# Patient Record
Sex: Female | Born: 1960 | Race: White | Hispanic: No | Marital: Married | Smoking: Current every day smoker
Health system: Southern US, Community
[De-identification: ages and names within clinical notes are randomized; demographics above are authoritative.]

## PROBLEM LIST (undated history)

## (undated) DIAGNOSIS — K219 Gastro-esophageal reflux disease without esophagitis: Secondary | ICD-10-CM

## (undated) DIAGNOSIS — G8929 Other chronic pain: Secondary | ICD-10-CM

## (undated) DIAGNOSIS — F39 Unspecified mood [affective] disorder: Secondary | ICD-10-CM

## (undated) DIAGNOSIS — M797 Fibromyalgia: Secondary | ICD-10-CM

## (undated) DIAGNOSIS — M549 Dorsalgia, unspecified: Secondary | ICD-10-CM

## (undated) DIAGNOSIS — G5600 Carpal tunnel syndrome, unspecified upper limb: Secondary | ICD-10-CM

## (undated) DIAGNOSIS — I341 Nonrheumatic mitral (valve) prolapse: Secondary | ICD-10-CM

## (undated) DIAGNOSIS — F909 Attention-deficit hyperactivity disorder, unspecified type: Secondary | ICD-10-CM

## (undated) HISTORY — PX: SPINAL CORD STIMULATOR IMPLANT: SHX2422

## (undated) HISTORY — PX: CARPAL TUNNEL RELEASE: SHX101

## (undated) HISTORY — PX: BACK SURGERY: SHX140

## (undated) HISTORY — PX: ABDOMINAL HYSTERECTOMY: SHX81

## (undated) HISTORY — PX: OTHER SURGICAL HISTORY: SHX169

---

## 1997-06-29 ENCOUNTER — Inpatient Hospital Stay (HOSPITAL_COMMUNITY): Admission: EM | Admit: 1997-06-29 | Discharge: 1997-07-01 | Payer: Self-pay | Admitting: Emergency Medicine

## 1997-11-05 ENCOUNTER — Emergency Department (HOSPITAL_COMMUNITY): Admission: EM | Admit: 1997-11-05 | Discharge: 1997-11-05 | Payer: Self-pay | Admitting: Emergency Medicine

## 1998-05-15 ENCOUNTER — Inpatient Hospital Stay (HOSPITAL_COMMUNITY): Admission: EM | Admit: 1998-05-15 | Discharge: 1998-05-20 | Payer: Self-pay | Admitting: *Deleted

## 1998-06-20 ENCOUNTER — Ambulatory Visit: Admission: RE | Admit: 1998-06-20 | Discharge: 1998-06-20 | Payer: Self-pay | Admitting: Family Medicine

## 1998-06-29 ENCOUNTER — Emergency Department (HOSPITAL_COMMUNITY): Admission: EM | Admit: 1998-06-29 | Discharge: 1998-06-29 | Payer: Self-pay | Admitting: Emergency Medicine

## 1998-08-24 ENCOUNTER — Other Ambulatory Visit: Admission: RE | Admit: 1998-08-24 | Discharge: 1998-08-24 | Payer: Self-pay | Admitting: *Deleted

## 1998-08-25 ENCOUNTER — Encounter: Payer: Self-pay | Admitting: Emergency Medicine

## 1998-08-25 ENCOUNTER — Emergency Department (HOSPITAL_COMMUNITY): Admission: EM | Admit: 1998-08-25 | Discharge: 1998-08-26 | Payer: Self-pay | Admitting: Emergency Medicine

## 1998-09-30 ENCOUNTER — Emergency Department (HOSPITAL_COMMUNITY): Admission: EM | Admit: 1998-09-30 | Discharge: 1998-09-30 | Payer: Self-pay | Admitting: Emergency Medicine

## 1998-11-17 ENCOUNTER — Emergency Department (HOSPITAL_COMMUNITY): Admission: EM | Admit: 1998-11-17 | Discharge: 1998-11-17 | Payer: Self-pay | Admitting: Emergency Medicine

## 1998-11-28 ENCOUNTER — Emergency Department (HOSPITAL_COMMUNITY): Admission: EM | Admit: 1998-11-28 | Discharge: 1998-11-28 | Payer: Self-pay | Admitting: Emergency Medicine

## 1998-11-28 ENCOUNTER — Encounter: Payer: Self-pay | Admitting: Emergency Medicine

## 1999-01-17 ENCOUNTER — Encounter: Payer: Self-pay | Admitting: Emergency Medicine

## 1999-01-17 ENCOUNTER — Emergency Department (HOSPITAL_COMMUNITY): Admission: EM | Admit: 1999-01-17 | Discharge: 1999-01-17 | Payer: Self-pay | Admitting: Emergency Medicine

## 1999-03-03 ENCOUNTER — Emergency Department (HOSPITAL_COMMUNITY): Admission: EM | Admit: 1999-03-03 | Discharge: 1999-03-03 | Payer: Self-pay | Admitting: Emergency Medicine

## 1999-07-17 ENCOUNTER — Emergency Department (HOSPITAL_COMMUNITY): Admission: EM | Admit: 1999-07-17 | Discharge: 1999-07-17 | Payer: Self-pay | Admitting: Internal Medicine

## 1999-07-30 ENCOUNTER — Emergency Department (HOSPITAL_COMMUNITY): Admission: EM | Admit: 1999-07-30 | Discharge: 1999-07-30 | Payer: Self-pay | Admitting: *Deleted

## 1999-08-22 ENCOUNTER — Encounter: Payer: Self-pay | Admitting: Family Medicine

## 1999-08-22 ENCOUNTER — Ambulatory Visit (HOSPITAL_COMMUNITY): Admission: RE | Admit: 1999-08-22 | Discharge: 1999-08-22 | Payer: Self-pay | Admitting: Family Medicine

## 1999-09-05 ENCOUNTER — Encounter: Admission: RE | Admit: 1999-09-05 | Discharge: 1999-09-19 | Payer: Self-pay | Admitting: Family Medicine

## 1999-09-29 ENCOUNTER — Emergency Department (HOSPITAL_COMMUNITY): Admission: EM | Admit: 1999-09-29 | Discharge: 1999-09-29 | Payer: Self-pay | Admitting: Internal Medicine

## 1999-12-16 ENCOUNTER — Emergency Department (HOSPITAL_COMMUNITY): Admission: EM | Admit: 1999-12-16 | Discharge: 1999-12-17 | Payer: Self-pay | Admitting: Emergency Medicine

## 1999-12-17 ENCOUNTER — Encounter: Payer: Self-pay | Admitting: Emergency Medicine

## 1999-12-29 ENCOUNTER — Emergency Department (HOSPITAL_COMMUNITY): Admission: EM | Admit: 1999-12-29 | Discharge: 1999-12-29 | Payer: Self-pay | Admitting: Emergency Medicine

## 2000-01-06 ENCOUNTER — Emergency Department (HOSPITAL_COMMUNITY): Admission: EM | Admit: 2000-01-06 | Discharge: 2000-01-06 | Payer: Self-pay | Admitting: Emergency Medicine

## 2000-01-27 ENCOUNTER — Emergency Department (HOSPITAL_COMMUNITY): Admission: EM | Admit: 2000-01-27 | Discharge: 2000-01-27 | Payer: Self-pay | Admitting: *Deleted

## 2000-01-27 ENCOUNTER — Encounter: Payer: Self-pay | Admitting: *Deleted

## 2000-03-06 ENCOUNTER — Encounter: Admission: RE | Admit: 2000-03-06 | Discharge: 2000-03-06 | Payer: Self-pay | Admitting: Otolaryngology

## 2000-03-06 ENCOUNTER — Encounter: Payer: Self-pay | Admitting: Otolaryngology

## 2000-07-01 ENCOUNTER — Inpatient Hospital Stay (HOSPITAL_COMMUNITY): Admission: AD | Admit: 2000-07-01 | Discharge: 2000-07-01 | Payer: Self-pay | Admitting: Obstetrics & Gynecology

## 2000-07-02 ENCOUNTER — Other Ambulatory Visit: Admission: RE | Admit: 2000-07-02 | Discharge: 2000-07-02 | Payer: Self-pay | Admitting: Gynecology

## 2000-08-04 ENCOUNTER — Emergency Department (HOSPITAL_COMMUNITY): Admission: EM | Admit: 2000-08-04 | Discharge: 2000-08-04 | Payer: Self-pay | Admitting: Internal Medicine

## 2000-08-04 ENCOUNTER — Encounter: Payer: Self-pay | Admitting: Emergency Medicine

## 2000-08-05 ENCOUNTER — Encounter: Payer: Self-pay | Admitting: Emergency Medicine

## 2000-08-05 ENCOUNTER — Emergency Department (HOSPITAL_COMMUNITY): Admission: EM | Admit: 2000-08-05 | Discharge: 2000-08-05 | Payer: Self-pay | Admitting: *Deleted

## 2000-08-15 ENCOUNTER — Encounter: Payer: Self-pay | Admitting: Emergency Medicine

## 2000-08-15 ENCOUNTER — Inpatient Hospital Stay (HOSPITAL_COMMUNITY): Admission: EM | Admit: 2000-08-15 | Discharge: 2000-08-17 | Payer: Self-pay | Admitting: *Deleted

## 2000-09-15 ENCOUNTER — Inpatient Hospital Stay (HOSPITAL_COMMUNITY): Admission: AD | Admit: 2000-09-15 | Discharge: 2000-09-15 | Payer: Self-pay | Admitting: Gynecology

## 2000-09-24 ENCOUNTER — Emergency Department (HOSPITAL_COMMUNITY): Admission: EM | Admit: 2000-09-24 | Discharge: 2000-09-24 | Payer: Self-pay | Admitting: Emergency Medicine

## 2000-09-30 ENCOUNTER — Emergency Department (HOSPITAL_COMMUNITY): Admission: EM | Admit: 2000-09-30 | Discharge: 2000-09-30 | Payer: Self-pay | Admitting: Emergency Medicine

## 2000-09-30 ENCOUNTER — Encounter: Payer: Self-pay | Admitting: Emergency Medicine

## 2000-10-17 ENCOUNTER — Emergency Department (HOSPITAL_COMMUNITY): Admission: EM | Admit: 2000-10-17 | Discharge: 2000-10-17 | Payer: Self-pay

## 2000-12-22 ENCOUNTER — Emergency Department (HOSPITAL_COMMUNITY): Admission: EM | Admit: 2000-12-22 | Discharge: 2000-12-22 | Payer: Self-pay | Admitting: Emergency Medicine

## 2001-01-07 ENCOUNTER — Emergency Department (HOSPITAL_COMMUNITY): Admission: EM | Admit: 2001-01-07 | Discharge: 2001-01-07 | Payer: Self-pay | Admitting: Emergency Medicine

## 2001-06-07 ENCOUNTER — Encounter: Admission: RE | Admit: 2001-06-07 | Discharge: 2001-06-07 | Payer: Self-pay | Admitting: Family Medicine

## 2001-06-07 ENCOUNTER — Encounter: Payer: Self-pay | Admitting: Family Medicine

## 2001-06-24 ENCOUNTER — Encounter: Admission: RE | Admit: 2001-06-24 | Discharge: 2001-09-22 | Payer: Self-pay | Admitting: Orthopedic Surgery

## 2001-06-27 ENCOUNTER — Emergency Department (HOSPITAL_COMMUNITY): Admission: EM | Admit: 2001-06-27 | Discharge: 2001-06-27 | Payer: Self-pay | Admitting: Emergency Medicine

## 2001-08-04 ENCOUNTER — Encounter: Admission: RE | Admit: 2001-08-04 | Discharge: 2001-08-18 | Payer: Self-pay | Admitting: Orthopedic Surgery

## 2001-08-18 ENCOUNTER — Encounter: Admission: RE | Admit: 2001-08-18 | Discharge: 2001-10-19 | Payer: Self-pay

## 2001-08-22 ENCOUNTER — Inpatient Hospital Stay (HOSPITAL_COMMUNITY): Admission: EM | Admit: 2001-08-22 | Discharge: 2001-08-23 | Payer: Self-pay | Admitting: Emergency Medicine

## 2001-08-22 ENCOUNTER — Encounter: Payer: Self-pay | Admitting: Emergency Medicine

## 2001-10-04 ENCOUNTER — Ambulatory Visit (HOSPITAL_COMMUNITY): Admission: RE | Admit: 2001-10-04 | Discharge: 2001-10-04 | Payer: Self-pay

## 2001-11-04 ENCOUNTER — Encounter: Admission: RE | Admit: 2001-11-04 | Discharge: 2002-02-02 | Payer: Self-pay

## 2002-01-05 ENCOUNTER — Other Ambulatory Visit: Admission: RE | Admit: 2002-01-05 | Discharge: 2002-01-05 | Payer: Self-pay | Admitting: Gynecology

## 2002-03-24 ENCOUNTER — Observation Stay (HOSPITAL_COMMUNITY): Admission: RE | Admit: 2002-03-24 | Discharge: 2002-03-25 | Payer: Self-pay

## 2002-03-24 ENCOUNTER — Encounter (INDEPENDENT_AMBULATORY_CARE_PROVIDER_SITE_OTHER): Payer: Self-pay

## 2002-03-26 ENCOUNTER — Inpatient Hospital Stay (HOSPITAL_COMMUNITY): Admission: AD | Admit: 2002-03-26 | Discharge: 2002-03-26 | Payer: Self-pay | Admitting: Gynecology

## 2002-03-31 ENCOUNTER — Encounter: Admission: RE | Admit: 2002-03-31 | Discharge: 2002-06-29 | Payer: Self-pay

## 2002-05-11 ENCOUNTER — Encounter
Admission: RE | Admit: 2002-05-11 | Discharge: 2002-07-19 | Payer: Self-pay | Admitting: Physical Medicine and Rehabilitation

## 2002-06-16 ENCOUNTER — Emergency Department (HOSPITAL_COMMUNITY): Admission: EM | Admit: 2002-06-16 | Discharge: 2002-06-16 | Payer: Self-pay | Admitting: Emergency Medicine

## 2002-06-29 ENCOUNTER — Emergency Department (HOSPITAL_COMMUNITY): Admission: EM | Admit: 2002-06-29 | Discharge: 2002-06-29 | Payer: Self-pay | Admitting: Emergency Medicine

## 2002-11-18 ENCOUNTER — Emergency Department (HOSPITAL_COMMUNITY): Admission: EM | Admit: 2002-11-18 | Discharge: 2002-11-18 | Payer: Self-pay | Admitting: Emergency Medicine

## 2002-12-18 ENCOUNTER — Observation Stay (HOSPITAL_COMMUNITY): Admission: EM | Admit: 2002-12-18 | Discharge: 2002-12-18 | Payer: Self-pay | Admitting: *Deleted

## 2002-12-27 ENCOUNTER — Emergency Department (HOSPITAL_COMMUNITY): Admission: EM | Admit: 2002-12-27 | Discharge: 2002-12-27 | Payer: Self-pay | Admitting: Emergency Medicine

## 2003-04-30 ENCOUNTER — Emergency Department (HOSPITAL_COMMUNITY): Admission: EM | Admit: 2003-04-30 | Discharge: 2003-04-30 | Payer: Self-pay | Admitting: *Deleted

## 2003-07-25 ENCOUNTER — Encounter: Admission: RE | Admit: 2003-07-25 | Discharge: 2003-07-25 | Payer: Self-pay | Admitting: Family Medicine

## 2004-01-12 ENCOUNTER — Encounter: Admission: RE | Admit: 2004-01-12 | Discharge: 2004-01-12 | Payer: Self-pay | Admitting: Internal Medicine

## 2004-03-19 ENCOUNTER — Emergency Department (HOSPITAL_COMMUNITY): Admission: EM | Admit: 2004-03-19 | Discharge: 2004-03-19 | Payer: Self-pay | Admitting: Emergency Medicine

## 2004-04-14 ENCOUNTER — Emergency Department (HOSPITAL_COMMUNITY): Admission: EM | Admit: 2004-04-14 | Discharge: 2004-04-14 | Payer: Self-pay | Admitting: *Deleted

## 2004-05-10 ENCOUNTER — Emergency Department (HOSPITAL_COMMUNITY): Admission: EM | Admit: 2004-05-10 | Discharge: 2004-05-10 | Payer: Self-pay | Admitting: Emergency Medicine

## 2004-05-11 ENCOUNTER — Inpatient Hospital Stay (HOSPITAL_COMMUNITY): Admission: AD | Admit: 2004-05-11 | Discharge: 2004-05-11 | Payer: Self-pay | Admitting: *Deleted

## 2004-06-21 ENCOUNTER — Emergency Department (HOSPITAL_COMMUNITY): Admission: EM | Admit: 2004-06-21 | Discharge: 2004-06-21 | Payer: Self-pay | Admitting: Emergency Medicine

## 2004-07-17 ENCOUNTER — Emergency Department (HOSPITAL_COMMUNITY): Admission: EM | Admit: 2004-07-17 | Discharge: 2004-07-18 | Payer: Self-pay | Admitting: Emergency Medicine

## 2004-07-20 ENCOUNTER — Emergency Department (HOSPITAL_COMMUNITY): Admission: EM | Admit: 2004-07-20 | Discharge: 2004-07-20 | Payer: Self-pay | Admitting: Emergency Medicine

## 2004-07-25 ENCOUNTER — Emergency Department (HOSPITAL_COMMUNITY): Admission: EM | Admit: 2004-07-25 | Discharge: 2004-07-25 | Payer: Self-pay | Admitting: Emergency Medicine

## 2004-07-30 ENCOUNTER — Encounter: Admission: RE | Admit: 2004-07-30 | Discharge: 2004-07-30 | Payer: Self-pay | Admitting: Orthopaedic Surgery

## 2004-08-04 ENCOUNTER — Emergency Department (HOSPITAL_COMMUNITY): Admission: EM | Admit: 2004-08-04 | Discharge: 2004-08-04 | Payer: Self-pay | Admitting: *Deleted

## 2004-08-27 ENCOUNTER — Encounter: Admission: RE | Admit: 2004-08-27 | Discharge: 2004-08-27 | Payer: Self-pay | Admitting: Internal Medicine

## 2004-09-06 ENCOUNTER — Encounter: Admission: RE | Admit: 2004-09-06 | Discharge: 2004-09-06 | Payer: Self-pay | Admitting: Internal Medicine

## 2004-10-12 ENCOUNTER — Emergency Department (HOSPITAL_COMMUNITY): Admission: EM | Admit: 2004-10-12 | Discharge: 2004-10-12 | Payer: Self-pay | Admitting: Emergency Medicine

## 2004-10-29 ENCOUNTER — Emergency Department (HOSPITAL_COMMUNITY): Admission: EM | Admit: 2004-10-29 | Discharge: 2004-10-29 | Payer: Self-pay | Admitting: Emergency Medicine

## 2004-10-30 ENCOUNTER — Emergency Department (HOSPITAL_COMMUNITY): Admission: EM | Admit: 2004-10-30 | Discharge: 2004-10-30 | Payer: Self-pay | Admitting: Emergency Medicine

## 2004-11-06 ENCOUNTER — Emergency Department (HOSPITAL_COMMUNITY): Admission: EM | Admit: 2004-11-06 | Discharge: 2004-11-06 | Payer: Self-pay | Admitting: Emergency Medicine

## 2004-11-08 ENCOUNTER — Ambulatory Visit (HOSPITAL_COMMUNITY): Admission: RE | Admit: 2004-11-08 | Discharge: 2004-11-08 | Payer: Self-pay | Admitting: Internal Medicine

## 2004-12-16 ENCOUNTER — Emergency Department (HOSPITAL_COMMUNITY): Admission: EM | Admit: 2004-12-16 | Discharge: 2004-12-16 | Payer: Self-pay | Admitting: Emergency Medicine

## 2004-12-26 ENCOUNTER — Other Ambulatory Visit: Admission: RE | Admit: 2004-12-26 | Discharge: 2004-12-26 | Payer: Self-pay | Admitting: Gynecology

## 2005-01-22 ENCOUNTER — Emergency Department (HOSPITAL_COMMUNITY): Admission: EM | Admit: 2005-01-22 | Discharge: 2005-01-22 | Payer: Self-pay | Admitting: Emergency Medicine

## 2005-02-05 ENCOUNTER — Emergency Department (HOSPITAL_COMMUNITY): Admission: EM | Admit: 2005-02-05 | Discharge: 2005-02-05 | Payer: Self-pay | Admitting: Emergency Medicine

## 2005-04-16 ENCOUNTER — Inpatient Hospital Stay (HOSPITAL_COMMUNITY): Admission: RE | Admit: 2005-04-16 | Discharge: 2005-04-18 | Payer: Self-pay | Admitting: Psychiatry

## 2005-04-17 ENCOUNTER — Ambulatory Visit: Payer: Self-pay | Admitting: Psychiatry

## 2005-05-03 ENCOUNTER — Emergency Department (HOSPITAL_COMMUNITY): Admission: EM | Admit: 2005-05-03 | Discharge: 2005-05-03 | Payer: Self-pay | Admitting: Emergency Medicine

## 2005-06-11 ENCOUNTER — Other Ambulatory Visit: Admission: RE | Admit: 2005-06-11 | Discharge: 2005-06-11 | Payer: Self-pay | Admitting: Gynecology

## 2005-06-25 ENCOUNTER — Emergency Department (HOSPITAL_COMMUNITY): Admission: EM | Admit: 2005-06-25 | Discharge: 2005-06-25 | Payer: Self-pay | Admitting: Emergency Medicine

## 2005-07-17 ENCOUNTER — Emergency Department (HOSPITAL_COMMUNITY): Admission: EM | Admit: 2005-07-17 | Discharge: 2005-07-17 | Payer: Self-pay | Admitting: Emergency Medicine

## 2005-08-05 ENCOUNTER — Encounter: Payer: Self-pay | Admitting: Emergency Medicine

## 2005-08-05 ENCOUNTER — Emergency Department (HOSPITAL_COMMUNITY): Admission: EM | Admit: 2005-08-05 | Discharge: 2005-08-05 | Payer: Self-pay | Admitting: Emergency Medicine

## 2005-08-06 ENCOUNTER — Emergency Department (HOSPITAL_COMMUNITY): Admission: EM | Admit: 2005-08-06 | Discharge: 2005-08-06 | Payer: Self-pay | Admitting: Emergency Medicine

## 2005-08-17 ENCOUNTER — Emergency Department (HOSPITAL_COMMUNITY): Admission: EM | Admit: 2005-08-17 | Discharge: 2005-08-17 | Payer: Self-pay | Admitting: Emergency Medicine

## 2005-08-18 ENCOUNTER — Emergency Department (HOSPITAL_COMMUNITY): Admission: EM | Admit: 2005-08-18 | Discharge: 2005-08-18 | Payer: Self-pay | Admitting: Emergency Medicine

## 2005-09-16 ENCOUNTER — Encounter
Admission: RE | Admit: 2005-09-16 | Discharge: 2005-12-15 | Payer: Self-pay | Admitting: Physical Medicine and Rehabilitation

## 2005-09-16 ENCOUNTER — Ambulatory Visit: Payer: Self-pay | Admitting: Physical Medicine and Rehabilitation

## 2005-09-23 ENCOUNTER — Ambulatory Visit: Payer: Self-pay | Admitting: Anesthesiology

## 2005-09-30 ENCOUNTER — Encounter: Admission: RE | Admit: 2005-09-30 | Discharge: 2005-09-30 | Payer: Self-pay | Admitting: Gynecology

## 2005-10-14 ENCOUNTER — Other Ambulatory Visit: Admission: RE | Admit: 2005-10-14 | Discharge: 2005-10-14 | Payer: Self-pay | Admitting: Gynecology

## 2005-11-11 ENCOUNTER — Emergency Department (HOSPITAL_COMMUNITY): Admission: EM | Admit: 2005-11-11 | Discharge: 2005-11-12 | Payer: Self-pay | Admitting: Emergency Medicine

## 2005-11-12 ENCOUNTER — Ambulatory Visit (HOSPITAL_COMMUNITY): Admission: RE | Admit: 2005-11-12 | Discharge: 2005-11-12 | Payer: Self-pay | Admitting: Family Medicine

## 2005-11-12 ENCOUNTER — Encounter: Payer: Self-pay | Admitting: Vascular Surgery

## 2005-11-23 ENCOUNTER — Emergency Department (HOSPITAL_COMMUNITY): Admission: EM | Admit: 2005-11-23 | Discharge: 2005-11-24 | Payer: Self-pay | Admitting: Emergency Medicine

## 2006-01-30 ENCOUNTER — Encounter
Admission: RE | Admit: 2006-01-30 | Discharge: 2006-04-30 | Payer: Self-pay | Admitting: Physical Medicine and Rehabilitation

## 2006-01-30 ENCOUNTER — Ambulatory Visit: Payer: Self-pay | Admitting: Physical Medicine and Rehabilitation

## 2006-03-02 ENCOUNTER — Ambulatory Visit: Payer: Self-pay | Admitting: Physical Medicine and Rehabilitation

## 2006-03-03 ENCOUNTER — Emergency Department (HOSPITAL_COMMUNITY): Admission: EM | Admit: 2006-03-03 | Discharge: 2006-03-03 | Payer: Self-pay | Admitting: Emergency Medicine

## 2006-03-31 ENCOUNTER — Ambulatory Visit: Payer: Self-pay | Admitting: Physical Medicine and Rehabilitation

## 2006-04-18 ENCOUNTER — Emergency Department (HOSPITAL_COMMUNITY): Admission: EM | Admit: 2006-04-18 | Discharge: 2006-04-19 | Payer: Self-pay | Admitting: Emergency Medicine

## 2006-04-19 ENCOUNTER — Inpatient Hospital Stay (HOSPITAL_COMMUNITY): Admission: EM | Admit: 2006-04-19 | Discharge: 2006-04-20 | Payer: Self-pay | Admitting: Emergency Medicine

## 2006-05-12 ENCOUNTER — Ambulatory Visit: Payer: Self-pay | Admitting: Physical Medicine and Rehabilitation

## 2006-05-12 ENCOUNTER — Encounter
Admission: RE | Admit: 2006-05-12 | Discharge: 2006-08-10 | Payer: Self-pay | Admitting: Physical Medicine and Rehabilitation

## 2006-05-28 ENCOUNTER — Emergency Department (HOSPITAL_COMMUNITY): Admission: EM | Admit: 2006-05-28 | Discharge: 2006-05-28 | Payer: Self-pay | Admitting: Emergency Medicine

## 2006-06-12 ENCOUNTER — Ambulatory Visit: Payer: Self-pay | Admitting: Psychiatry

## 2006-06-12 ENCOUNTER — Inpatient Hospital Stay (HOSPITAL_COMMUNITY): Admission: AD | Admit: 2006-06-12 | Discharge: 2006-06-17 | Payer: Self-pay | Admitting: Psychiatry

## 2006-06-15 ENCOUNTER — Encounter: Payer: Self-pay | Admitting: Emergency Medicine

## 2006-06-19 ENCOUNTER — Ambulatory Visit: Payer: Self-pay | Admitting: Physical Medicine and Rehabilitation

## 2006-06-22 ENCOUNTER — Emergency Department (HOSPITAL_COMMUNITY): Admission: EM | Admit: 2006-06-22 | Discharge: 2006-06-22 | Payer: Self-pay | Admitting: Emergency Medicine

## 2006-06-22 ENCOUNTER — Ambulatory Visit (HOSPITAL_COMMUNITY)
Admission: RE | Admit: 2006-06-22 | Discharge: 2006-06-22 | Payer: Self-pay | Admitting: Physical Medicine and Rehabilitation

## 2006-06-30 ENCOUNTER — Emergency Department (HOSPITAL_COMMUNITY): Admission: EM | Admit: 2006-06-30 | Discharge: 2006-06-30 | Payer: Self-pay | Admitting: Emergency Medicine

## 2006-07-03 ENCOUNTER — Emergency Department (HOSPITAL_COMMUNITY): Admission: EM | Admit: 2006-07-03 | Discharge: 2006-07-04 | Payer: Self-pay | Admitting: Emergency Medicine

## 2006-07-14 ENCOUNTER — Emergency Department (HOSPITAL_COMMUNITY): Admission: EM | Admit: 2006-07-14 | Discharge: 2006-07-14 | Payer: Self-pay | Admitting: Emergency Medicine

## 2006-07-16 ENCOUNTER — Emergency Department (HOSPITAL_COMMUNITY): Admission: EM | Admit: 2006-07-16 | Discharge: 2006-07-16 | Payer: Self-pay | Admitting: Emergency Medicine

## 2006-07-17 ENCOUNTER — Emergency Department (HOSPITAL_COMMUNITY): Admission: EM | Admit: 2006-07-17 | Discharge: 2006-07-17 | Payer: Self-pay | Admitting: Emergency Medicine

## 2006-08-06 ENCOUNTER — Emergency Department (HOSPITAL_COMMUNITY): Admission: EM | Admit: 2006-08-06 | Discharge: 2006-08-06 | Payer: Self-pay | Admitting: Emergency Medicine

## 2006-08-16 ENCOUNTER — Emergency Department (HOSPITAL_COMMUNITY): Admission: EM | Admit: 2006-08-16 | Discharge: 2006-08-16 | Payer: Self-pay | Admitting: Emergency Medicine

## 2006-09-11 ENCOUNTER — Emergency Department (HOSPITAL_COMMUNITY): Admission: EM | Admit: 2006-09-11 | Discharge: 2006-09-11 | Payer: Self-pay | Admitting: Emergency Medicine

## 2006-10-02 ENCOUNTER — Other Ambulatory Visit: Admission: RE | Admit: 2006-10-02 | Discharge: 2006-10-02 | Payer: Self-pay | Admitting: Gynecology

## 2006-11-12 ENCOUNTER — Encounter: Admission: RE | Admit: 2006-11-12 | Discharge: 2006-11-12 | Payer: Self-pay | Admitting: Orthopedic Surgery

## 2006-11-23 ENCOUNTER — Encounter: Admission: RE | Admit: 2006-11-23 | Discharge: 2006-11-23 | Payer: Self-pay | Admitting: Orthopedic Surgery

## 2006-11-23 ENCOUNTER — Encounter: Admission: RE | Admit: 2006-11-23 | Discharge: 2006-11-23 | Payer: Self-pay | Admitting: Neurology

## 2007-01-07 ENCOUNTER — Encounter: Admission: RE | Admit: 2007-01-07 | Discharge: 2007-01-07 | Payer: Self-pay | Admitting: Family Medicine

## 2007-01-21 ENCOUNTER — Other Ambulatory Visit: Payer: Self-pay | Admitting: Emergency Medicine

## 2007-01-21 ENCOUNTER — Ambulatory Visit: Payer: Self-pay | Admitting: Psychiatry

## 2007-01-21 ENCOUNTER — Inpatient Hospital Stay (HOSPITAL_COMMUNITY): Admission: AD | Admit: 2007-01-21 | Discharge: 2007-01-24 | Payer: Self-pay | Admitting: Psychiatry

## 2007-01-31 ENCOUNTER — Emergency Department (HOSPITAL_COMMUNITY): Admission: EM | Admit: 2007-01-31 | Discharge: 2007-01-31 | Payer: Self-pay | Admitting: Emergency Medicine

## 2007-01-31 ENCOUNTER — Inpatient Hospital Stay (HOSPITAL_COMMUNITY): Admission: AD | Admit: 2007-01-31 | Discharge: 2007-01-31 | Payer: Self-pay | Admitting: Gynecology

## 2007-02-04 ENCOUNTER — Emergency Department (HOSPITAL_COMMUNITY): Admission: EM | Admit: 2007-02-04 | Discharge: 2007-02-04 | Payer: Self-pay | Admitting: Emergency Medicine

## 2007-02-05 ENCOUNTER — Ambulatory Visit: Payer: Self-pay | Admitting: Gastroenterology

## 2007-02-05 ENCOUNTER — Ambulatory Visit: Payer: Self-pay | Admitting: Cardiology

## 2007-02-05 LAB — CONVERTED CEMR LAB
Albumin: 3.9 g/dL (ref 3.5–5.2)
Alkaline Phosphatase: 36 units/L — ABNORMAL LOW (ref 39–117)
BUN: 7 mg/dL (ref 6–23)
Basophils Absolute: 0 10*3/uL (ref 0.0–0.1)
Basophils Relative: 0.4 % (ref 0.0–1.0)
Folate: 4.1 ng/mL
GFR calc Af Amer: 87 mL/min
GFR calc non Af Amer: 72 mL/min
HCT: 43.3 % (ref 36.0–46.0)
Hemoglobin, Urine: NEGATIVE
Lipase: 24 units/L (ref 11.0–59.0)
MCHC: 35.7 g/dL (ref 30.0–36.0)
Neutrophils Relative %: 54.6 % (ref 43.0–77.0)
Potassium: 5 meq/L (ref 3.5–5.1)
RBC: 4.68 M/uL (ref 3.87–5.11)
RDW: 11.9 % (ref 11.5–14.6)
Saturation Ratios: 20.9 % (ref 20.0–50.0)
Sed Rate: 4 mm/hr (ref 0–25)
Specific Gravity, Urine: 1.02 (ref 1.000–1.03)
Total Protein, Urine: NEGATIVE mg/dL
Transferrin: 327.6 mg/dL (ref >=212.0)
WBC: 8.1 10*3/uL (ref 4.5–10.5)
pH: 7 (ref 5.0–8.0)

## 2007-02-26 ENCOUNTER — Ambulatory Visit: Payer: Self-pay | Admitting: Gastroenterology

## 2007-03-24 ENCOUNTER — Ambulatory Visit: Payer: Self-pay | Admitting: Gastroenterology

## 2007-03-24 ENCOUNTER — Encounter: Payer: Self-pay | Admitting: Gastroenterology

## 2007-03-25 ENCOUNTER — Emergency Department (HOSPITAL_COMMUNITY): Admission: EM | Admit: 2007-03-25 | Discharge: 2007-03-25 | Payer: Self-pay | Admitting: Emergency Medicine

## 2007-03-27 ENCOUNTER — Emergency Department (HOSPITAL_COMMUNITY): Admission: EM | Admit: 2007-03-27 | Discharge: 2007-03-28 | Payer: Self-pay | Admitting: Emergency Medicine

## 2007-04-27 ENCOUNTER — Ambulatory Visit: Payer: Self-pay | Admitting: Gastroenterology

## 2007-05-17 ENCOUNTER — Telehealth: Payer: Self-pay | Admitting: Gastroenterology

## 2007-05-19 ENCOUNTER — Telehealth: Payer: Self-pay | Admitting: Gastroenterology

## 2007-06-16 ENCOUNTER — Telehealth: Payer: Self-pay | Admitting: Gastroenterology

## 2007-06-24 ENCOUNTER — Emergency Department (HOSPITAL_COMMUNITY): Admission: EM | Admit: 2007-06-24 | Discharge: 2007-06-25 | Payer: Self-pay | Admitting: Emergency Medicine

## 2007-06-30 ENCOUNTER — Telehealth: Payer: Self-pay | Admitting: Gastroenterology

## 2007-07-07 ENCOUNTER — Observation Stay (HOSPITAL_COMMUNITY): Admission: EM | Admit: 2007-07-07 | Discharge: 2007-07-08 | Payer: Self-pay | Admitting: Emergency Medicine

## 2007-07-08 ENCOUNTER — Inpatient Hospital Stay (HOSPITAL_COMMUNITY): Admission: RE | Admit: 2007-07-08 | Discharge: 2007-07-12 | Payer: Self-pay | Admitting: Psychiatry

## 2007-07-09 ENCOUNTER — Ambulatory Visit: Payer: Self-pay | Admitting: Psychiatry

## 2007-08-17 ENCOUNTER — Emergency Department (HOSPITAL_COMMUNITY): Admission: EM | Admit: 2007-08-17 | Discharge: 2007-08-17 | Payer: Self-pay | Admitting: Emergency Medicine

## 2007-09-24 ENCOUNTER — Emergency Department (HOSPITAL_COMMUNITY): Admission: EM | Admit: 2007-09-24 | Discharge: 2007-09-24 | Payer: Self-pay | Admitting: Emergency Medicine

## 2007-10-11 ENCOUNTER — Emergency Department (HOSPITAL_COMMUNITY): Admission: EM | Admit: 2007-10-11 | Discharge: 2007-10-11 | Payer: Self-pay | Admitting: Emergency Medicine

## 2007-12-08 ENCOUNTER — Ambulatory Visit: Payer: Self-pay | Admitting: Gynecology

## 2007-12-09 ENCOUNTER — Ambulatory Visit: Payer: Self-pay | Admitting: Gynecology

## 2008-01-05 ENCOUNTER — Ambulatory Visit: Payer: Self-pay | Admitting: Gynecology

## 2008-03-01 ENCOUNTER — Ambulatory Visit: Payer: Self-pay | Admitting: Gynecology

## 2008-03-07 ENCOUNTER — Emergency Department (HOSPITAL_COMMUNITY): Admission: EM | Admit: 2008-03-07 | Discharge: 2008-03-07 | Payer: Self-pay | Admitting: *Deleted

## 2008-03-31 ENCOUNTER — Encounter: Admission: RE | Admit: 2008-03-31 | Discharge: 2008-03-31 | Payer: Self-pay | Admitting: Family Medicine

## 2008-07-24 ENCOUNTER — Emergency Department (HOSPITAL_COMMUNITY): Admission: EM | Admit: 2008-07-24 | Discharge: 2008-07-24 | Payer: Self-pay | Admitting: Emergency Medicine

## 2008-11-21 ENCOUNTER — Emergency Department (HOSPITAL_COMMUNITY): Admission: EM | Admit: 2008-11-21 | Discharge: 2008-11-21 | Payer: Self-pay | Admitting: Emergency Medicine

## 2008-12-08 ENCOUNTER — Emergency Department (HOSPITAL_COMMUNITY): Admission: EM | Admit: 2008-12-08 | Discharge: 2008-12-08 | Payer: Self-pay | Admitting: Emergency Medicine

## 2009-01-06 ENCOUNTER — Other Ambulatory Visit: Payer: Self-pay | Admitting: Emergency Medicine

## 2009-01-07 ENCOUNTER — Other Ambulatory Visit: Payer: Self-pay | Admitting: Emergency Medicine

## 2009-01-07 ENCOUNTER — Inpatient Hospital Stay (HOSPITAL_COMMUNITY): Admission: AD | Admit: 2009-01-07 | Discharge: 2009-01-11 | Payer: Self-pay | Admitting: Psychiatry

## 2009-01-07 ENCOUNTER — Ambulatory Visit: Payer: Self-pay | Admitting: Psychiatry

## 2009-01-09 IMAGING — CT CT HEAD W/O CM
1 series · 16 of 30 positions shown, 20 images · IV contrast (agent unspecified)
Comparison: none

HISTORY: Altered mental status, syncope

CT HEAD WITHOUT CONTRAST:
Routine noncontrast CT head compared to 08/05/2005
Normal ventricular morphology.
No midline shift or mass-effect.
Normal appearance of brain parenchyma.
No intracranial mass, hemorrhage, or infarction.
Visualized sinuses clear.
Bones unremarkable.

[Series 2: head_seq 4.5 h45s st · axial · 0.43mm/px · z∈[-145,-1]mm · 16 of 36 slices shown, 20 images]
[im 2/36  brain]
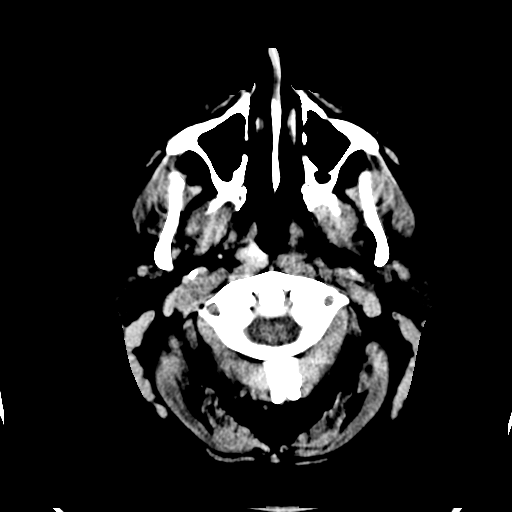
[im 2/36  bone]
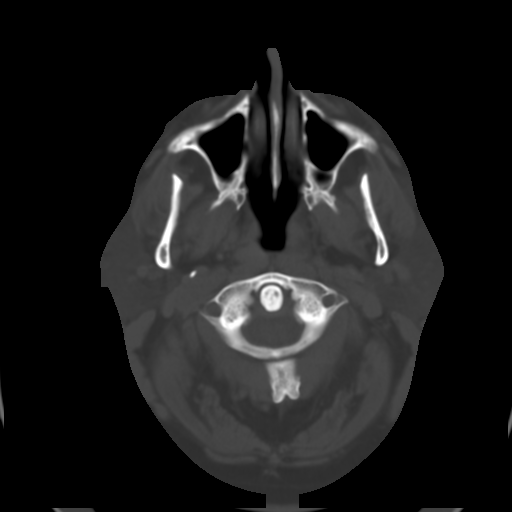
[im 4/36  brain]
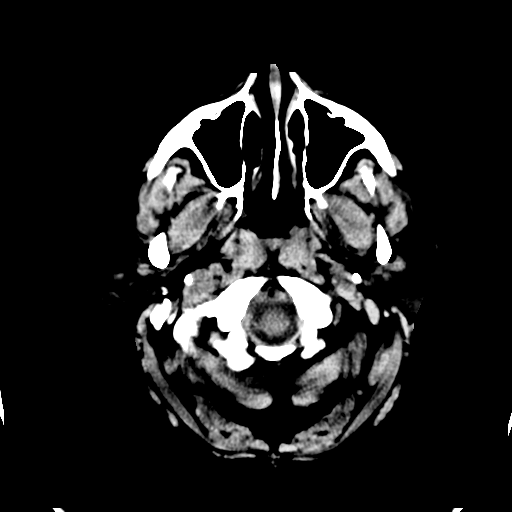
[im 7/36  brain]
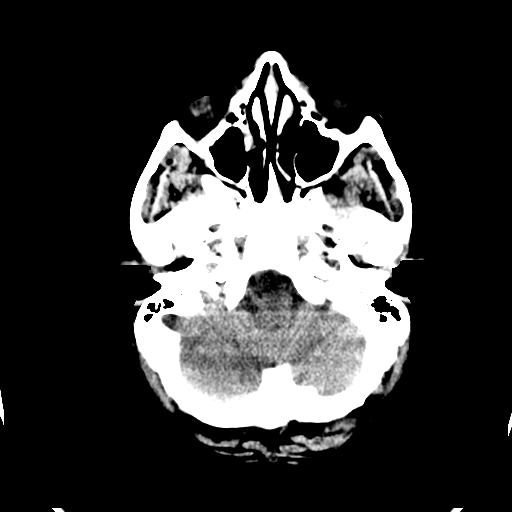
[im 9/36  brain]
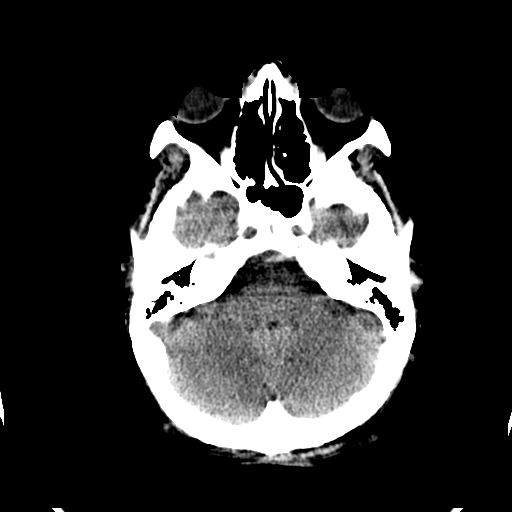
[im 10/36  brain]
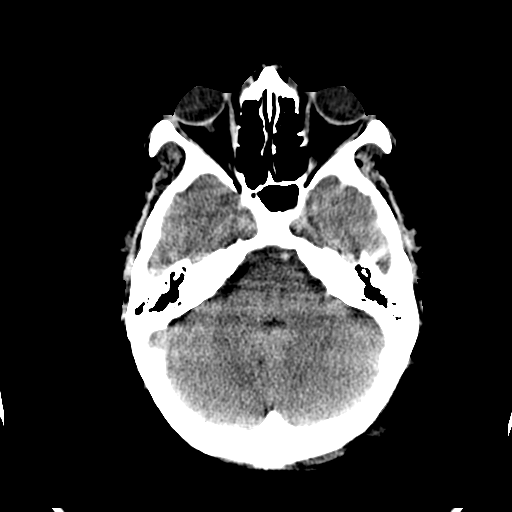
[im 10/36  bone]
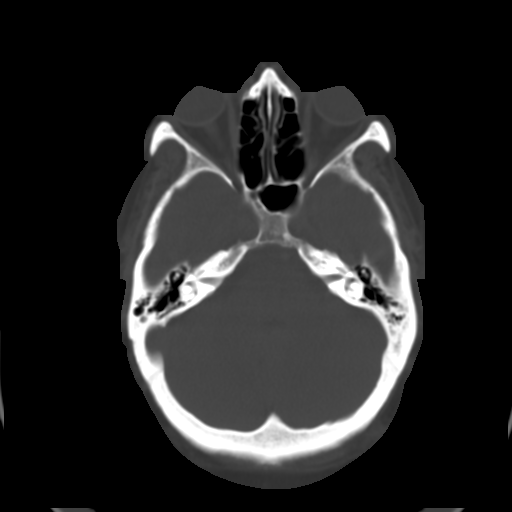
[im 13/36  brain]
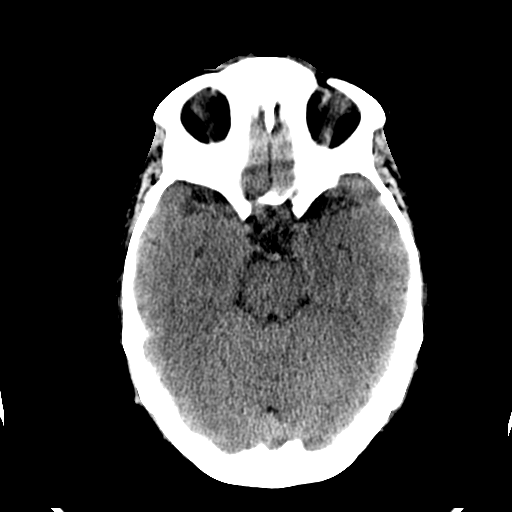
[im 15/36  brain]
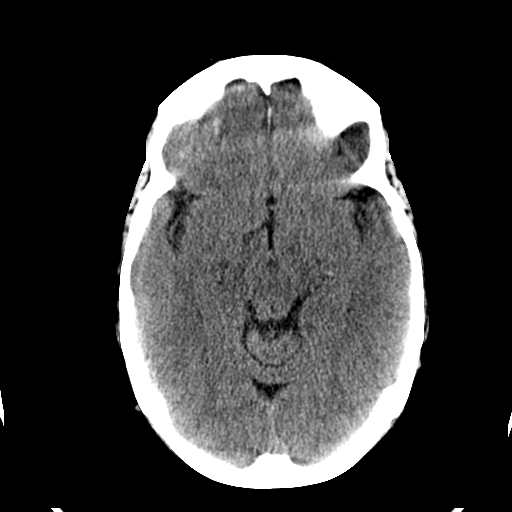
[im 17/36  brain]
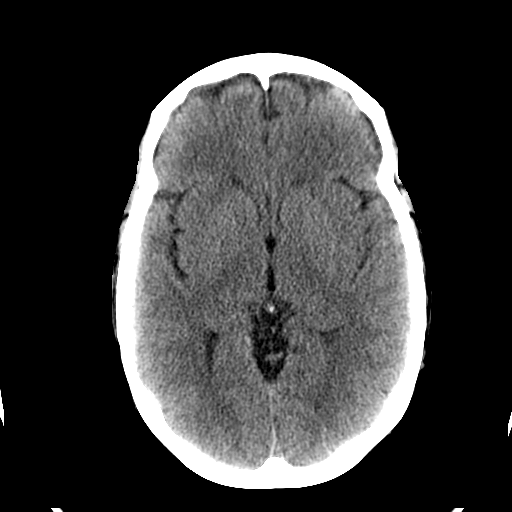
[im 19/36  brain]
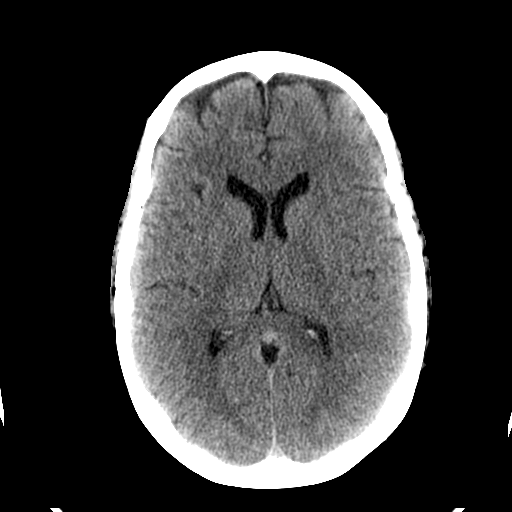
[im 19/36  bone]
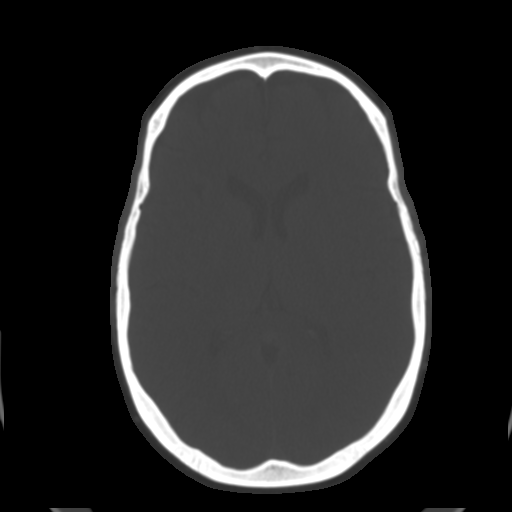
[im 21/36  brain]
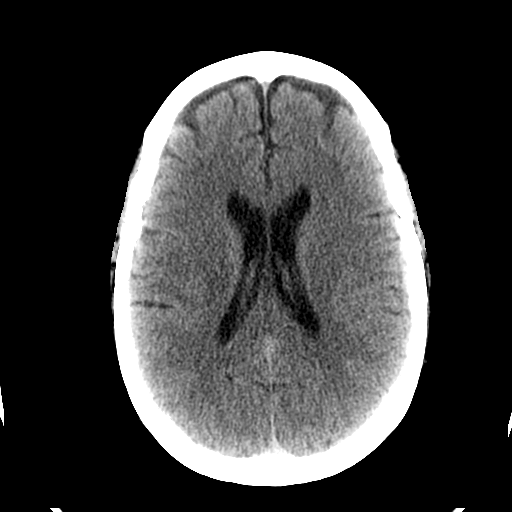
[im 23/36  brain]
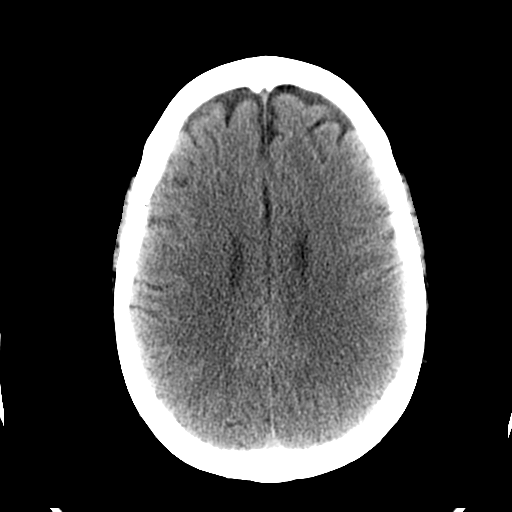
[im 26/36  brain]
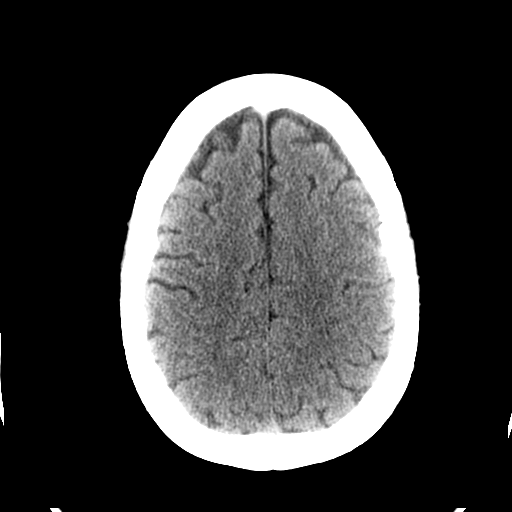
[im 27/36  brain]
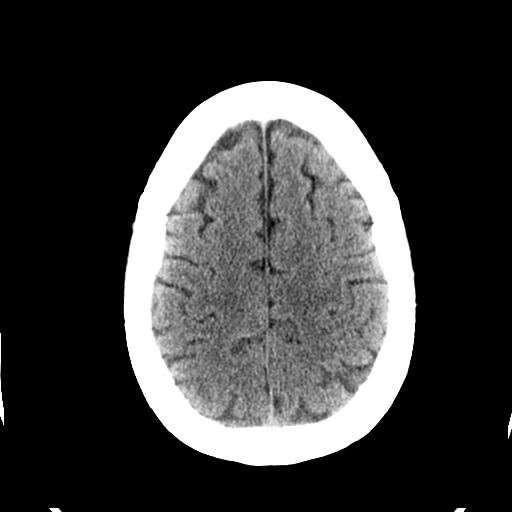
[im 27/36  bone]
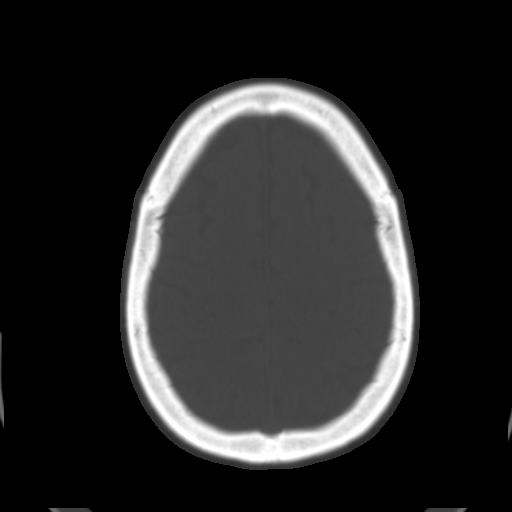
[im 29/36  brain]
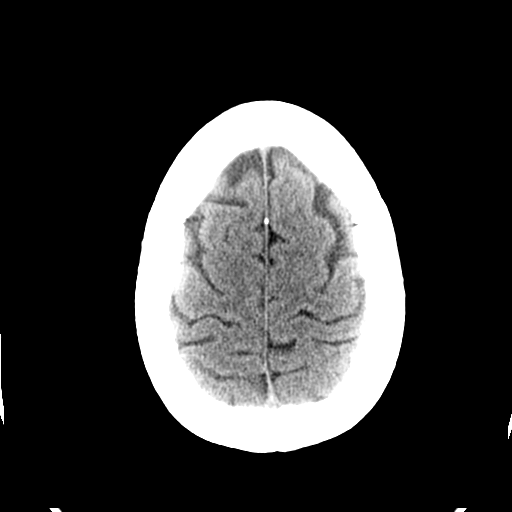
[im 32/36  brain]
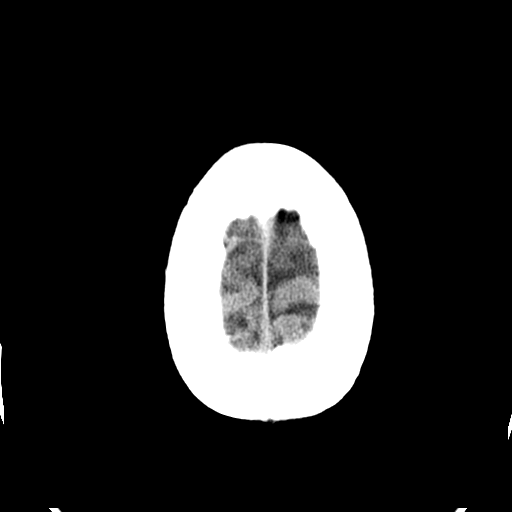
[im 34/36  brain]
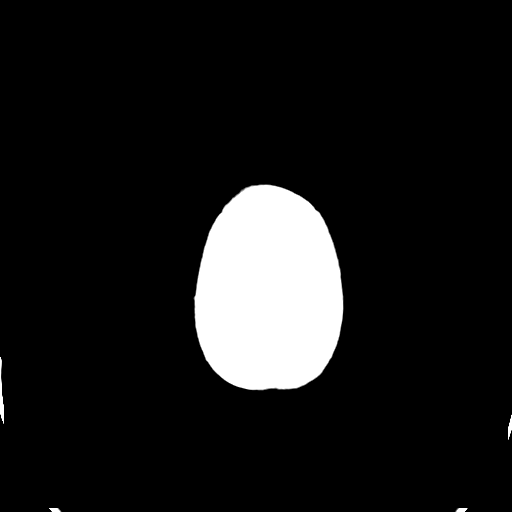

[16 of 30 positions shown; findings below may reference images not displayed]

IMPRESSION: No acute intracranial abnormality.

## 2009-03-13 ENCOUNTER — Other Ambulatory Visit: Admission: RE | Admit: 2009-03-13 | Discharge: 2009-03-13 | Payer: Self-pay | Admitting: Gynecology

## 2009-03-13 ENCOUNTER — Ambulatory Visit: Payer: Self-pay | Admitting: Gynecology

## 2009-04-09 ENCOUNTER — Encounter: Admission: RE | Admit: 2009-04-09 | Discharge: 2009-04-30 | Payer: Self-pay | Admitting: Sports Medicine

## 2009-05-02 ENCOUNTER — Encounter: Admission: RE | Admit: 2009-05-02 | Discharge: 2009-05-02 | Payer: Self-pay | Admitting: Gynecology

## 2009-05-02 ENCOUNTER — Encounter: Admission: RE | Admit: 2009-05-02 | Discharge: 2009-05-02 | Payer: Self-pay | Admitting: Family Medicine

## 2009-05-04 ENCOUNTER — Encounter: Admission: RE | Admit: 2009-05-04 | Discharge: 2009-05-04 | Payer: Self-pay | Admitting: Gynecology

## 2009-09-19 ENCOUNTER — Emergency Department (HOSPITAL_COMMUNITY): Admission: EM | Admit: 2009-09-19 | Discharge: 2009-09-19 | Payer: Self-pay | Admitting: Emergency Medicine

## 2009-11-08 ENCOUNTER — Emergency Department (HOSPITAL_COMMUNITY): Admission: EM | Admit: 2009-11-08 | Discharge: 2009-11-08 | Payer: Self-pay | Admitting: Emergency Medicine

## 2009-11-08 ENCOUNTER — Emergency Department (HOSPITAL_COMMUNITY): Admission: EM | Admit: 2009-11-08 | Discharge: 2009-11-08 | Payer: Self-pay | Admitting: Family Medicine

## 2009-11-12 ENCOUNTER — Emergency Department (HOSPITAL_COMMUNITY): Admission: EM | Admit: 2009-11-12 | Discharge: 2009-11-12 | Payer: Self-pay | Admitting: Emergency Medicine

## 2009-11-20 ENCOUNTER — Ambulatory Visit (HOSPITAL_COMMUNITY): Admission: RE | Admit: 2009-11-20 | Discharge: 2009-11-20 | Payer: Self-pay | Admitting: General Surgery

## 2009-12-11 ENCOUNTER — Emergency Department (HOSPITAL_COMMUNITY): Admission: EM | Admit: 2009-12-11 | Discharge: 2009-12-11 | Payer: Self-pay | Admitting: Family Medicine

## 2009-12-12 ENCOUNTER — Ambulatory Visit: Payer: Self-pay | Admitting: Gynecology

## 2009-12-17 ENCOUNTER — Telehealth: Payer: Self-pay | Admitting: Gastroenterology

## 2009-12-20 ENCOUNTER — Telehealth (INDEPENDENT_AMBULATORY_CARE_PROVIDER_SITE_OTHER): Payer: Self-pay | Admitting: *Deleted

## 2009-12-21 ENCOUNTER — Emergency Department (HOSPITAL_COMMUNITY)
Admission: EM | Admit: 2009-12-21 | Discharge: 2009-12-21 | Payer: Self-pay | Source: Home / Self Care | Admitting: Emergency Medicine

## 2010-02-10 ENCOUNTER — Encounter: Payer: Self-pay | Admitting: Internal Medicine

## 2010-02-10 ENCOUNTER — Encounter: Payer: Self-pay | Admitting: Gynecology

## 2010-02-19 NOTE — Progress Notes (Signed)
Summary: d/c dispute   Phone Note Call from Patient Call back at Home Phone [phone]   Caller: Patient Call For: Dr. Jarold Motto Reason for Call: Talk to Nurse Summary of Call: would like to dispute practice discharge and speak with a nurse... states she was hsopitalized during time of cancellations Initial call taken by: Vallarie Mare,  December 17, 2009 9:20 AM  Follow-up for Phone Call        Patient is advised that discharge is not able to be discussed and stands.  She is given the number to medical records to pick up her records. Darcey Nora, RN CGRN Follow-up by: Graciella Freer RN,  December 17, 2009 10:06 AM

## 2010-02-19 NOTE — Progress Notes (Signed)
  Phone Note Other Incoming   Request: Send information Summary of Call: Request received from [facility] forwarded to [facility].       

## 2010-03-14 ENCOUNTER — Encounter: Payer: Self-pay | Admitting: Gynecology

## 2010-04-02 LAB — DIFFERENTIAL
Basophils Absolute: 0 10*3/uL (ref 0.0–0.1)
Basophils Relative: 0 % (ref 0–1)
Monocytes Relative: 9 % (ref 3–12)
Neutro Abs: 3 10*3/uL (ref 1.7–7.7)
Neutrophils Relative %: 42 % — ABNORMAL LOW (ref 43–77)

## 2010-04-02 LAB — COMPREHENSIVE METABOLIC PANEL
Alkaline Phosphatase: 38 U/L — ABNORMAL LOW (ref 39–117)
BUN: 4 mg/dL — ABNORMAL LOW (ref 6–23)
CO2: 23 mEq/L (ref 19–32)
Calcium: 9.2 mg/dL (ref 8.4–10.5)
GFR calc non Af Amer: >60 mL/min (ref >60)
Glucose, Bld: 95 mg/dL (ref 70–99)
Potassium: 3.6 mEq/L (ref 3.5–5.1)
Total Protein: 6.1 g/dL (ref 6.0–8.3)

## 2010-04-02 LAB — CBC
HCT: 42.2 % (ref 36.0–46.0)
MCHC: 35.1 g/dL (ref 30.0–36.0)
MCV: 93.2 fL (ref 78.0–100.0)
RDW: 12.4 % (ref 11.5–15.5)

## 2010-04-02 LAB — LIPASE, BLOOD: Lipase: 32 U/L (ref 11–59)

## 2010-04-03 ENCOUNTER — Emergency Department (HOSPITAL_COMMUNITY)
Admission: EM | Admit: 2010-04-03 | Discharge: 2010-04-03 | Disposition: A | Discharge status: Home / Self Care | Payer: Medicare Other | Attending: Emergency Medicine | Admitting: Emergency Medicine

## 2010-04-03 ENCOUNTER — Emergency Department (HOSPITAL_COMMUNITY): Payer: Medicare Other

## 2010-04-03 DIAGNOSIS — R109 Unspecified abdominal pain: Secondary | ICD-10-CM | POA: Insufficient documentation

## 2010-04-03 DIAGNOSIS — G8929 Other chronic pain: Secondary | ICD-10-CM | POA: Insufficient documentation

## 2010-04-03 LAB — URINE MICROSCOPIC-ADD ON

## 2010-04-03 LAB — COMPREHENSIVE METABOLIC PANEL
ALT: 13 U/L (ref 0–35)
ALT: 9 U/L (ref 0–35)
AST: 12 U/L (ref 0–37)
AST: 14 U/L (ref 0–37)
Albumin: 3.8 g/dL (ref 3.5–5.2)
Alkaline Phosphatase: 38 U/L — ABNORMAL LOW (ref 39–117)
CO2: 20 mEq/L (ref 19–32)
CO2: 24 mEq/L (ref 19–32)
Calcium: 9.1 mg/dL (ref 8.4–10.5)
Chloride: 110 mEq/L (ref 96–112)
Chloride: 113 mEq/L — ABNORMAL HIGH (ref 96–112)
Creatinine, Ser: 0.66 mg/dL (ref 0.4–1.2)
GFR calc Af Amer: >60 mL/min (ref >60)
GFR calc Af Amer: >60 mL/min (ref >60)
GFR calc non Af Amer: >60 mL/min (ref >60)
GFR calc non Af Amer: >60 mL/min (ref >60)
Glucose, Bld: 89 mg/dL (ref 70–99)
Potassium: 3.9 mEq/L (ref 3.5–5.1)
Sodium: 139 mEq/L (ref 135–145)
Sodium: 141 mEq/L (ref 135–145)
Total Bilirubin: 0.5 mg/dL (ref 0.3–1.2)
Total Bilirubin: 0.6 mg/dL (ref 0.3–1.2)

## 2010-04-03 LAB — CBC
HCT: 44.9 % (ref 36.0–46.0)
Hemoglobin: 14.5 g/dL (ref 12.0–15.0)
Hemoglobin: 15.2 g/dL — ABNORMAL HIGH (ref 12.0–15.0)
Platelets: 184 10*3/uL (ref 150–400)
Platelets: 178 10*3/uL (ref 150–400)
RBC: 4.66 MIL/uL (ref 3.87–5.11)
RBC: 4.71 MIL/uL (ref 3.87–5.11)
RBC: 4.88 MIL/uL (ref 3.87–5.11)
RDW: 13.3 % (ref 11.5–15.5)
WBC: 6.5 10*3/uL (ref 4.0–10.5)
WBC: 6 10*3/uL (ref 4.0–10.5)
WBC: 6.8 10*3/uL (ref 4.0–10.5)

## 2010-04-03 LAB — HEPATIC FUNCTION PANEL
ALT: <8 U/L (ref 0–35)
Albumin: 4 g/dL (ref 3.5–5.2)
Alkaline Phosphatase: 36 U/L — ABNORMAL LOW (ref 39–117)
Indirect Bilirubin: 0.7 mg/dL (ref 0.3–0.9)
Total Protein: 6.3 g/dL (ref 6.0–8.3)

## 2010-04-03 LAB — URINALYSIS, ROUTINE W REFLEX MICROSCOPIC
Bilirubin Urine: NEGATIVE
Glucose, UA: NEGATIVE mg/dL
Glucose, UA: NEGATIVE mg/dL
Glucose, UA: NEGATIVE mg/dL
Hgb urine dipstick: NEGATIVE
Hgb urine dipstick: NEGATIVE
Ketones, ur: 15 mg/dL — AB
Ketones, ur: NEGATIVE mg/dL
Protein, ur: 30 mg/dL — AB
Protein, ur: NEGATIVE mg/dL
Protein, ur: NEGATIVE mg/dL
Specific Gravity, Urine: 1.033 — ABNORMAL HIGH (ref 1.005–1.030)
Urobilinogen, UA: 1 mg/dL (ref 0.0–1.0)
Urobilinogen, UA: 2 mg/dL — ABNORMAL HIGH (ref 0.0–1.0)
pH: 6.5 (ref 5.0–8.0)

## 2010-04-03 LAB — DIFFERENTIAL
Basophils Absolute: 0 10*3/uL (ref 0.0–0.1)
Basophils Absolute: 0 10*3/uL (ref 0.0–0.1)
Basophils Relative: 0 % (ref 0–1)
Basophils Relative: 0 % (ref 0–1)
Eosinophils Absolute: 0.1 10*3/uL (ref 0.0–0.7)
Eosinophils Absolute: 0.1 10*3/uL (ref 0.0–0.7)
Eosinophils Relative: 1 % (ref 0–5)
Lymphocytes Relative: 36 % (ref 12–46)
Lymphs Abs: 2.1 10*3/uL (ref 0.7–4.0)
Lymphs Abs: 2.5 10*3/uL (ref 0.7–4.0)
Neutro Abs: 2.8 10*3/uL (ref 1.7–7.7)
Neutro Abs: 3.7 10*3/uL (ref 1.7–7.7)
Neutrophils Relative %: 43 % (ref 43–77)
Neutrophils Relative %: 55 % (ref 43–77)

## 2010-04-03 LAB — GC/CHLAMYDIA PROBE AMP, GENITAL: GC Probe Amp, Genital: NEGATIVE

## 2010-04-03 LAB — BASIC METABOLIC PANEL
BUN: 7 mg/dL (ref 6–23)
Calcium: 8.8 mg/dL (ref 8.4–10.5)
Chloride: 109 mEq/L (ref 96–112)
Creatinine, Ser: 0.71 mg/dL (ref 0.4–1.2)
GFR calc Af Amer: >60 mL/min (ref >60)
GFR calc non Af Amer: >60 mL/min (ref >60)

## 2010-04-03 LAB — HEMOCCULT GUIAC POC 1CARD (OFFICE): Fecal Occult Bld: NEGATIVE

## 2010-04-03 LAB — POCT URINALYSIS DIPSTICK
Glucose, UA: 100 mg/dL — AB
Hgb urine dipstick: NEGATIVE
Nitrite: NEGATIVE
Urobilinogen, UA: >=8 mg/dL (ref 0.0–1.0)

## 2010-04-03 LAB — POCT PREGNANCY, URINE: Preg Test, Ur: NEGATIVE

## 2010-04-03 LAB — GLUCOSE, CAPILLARY: Glucose-Capillary: 102 mg/dL — ABNORMAL HIGH (ref 70–99)

## 2010-04-03 LAB — LIPASE, BLOOD: Lipase: 39 U/L (ref 11–59)

## 2010-04-03 LAB — WET PREP, GENITAL

## 2010-04-05 ENCOUNTER — Emergency Department (HOSPITAL_COMMUNITY)
Admission: EM | Admit: 2010-04-05 | Discharge: 2010-04-05 | Disposition: A | Discharge status: Home / Self Care | Payer: Medicare Other | Attending: Emergency Medicine | Admitting: Emergency Medicine

## 2010-04-05 DIAGNOSIS — R109 Unspecified abdominal pain: Secondary | ICD-10-CM | POA: Insufficient documentation

## 2010-04-05 DIAGNOSIS — G8929 Other chronic pain: Secondary | ICD-10-CM | POA: Insufficient documentation

## 2010-04-05 DIAGNOSIS — E119 Type 2 diabetes mellitus without complications: Secondary | ICD-10-CM | POA: Insufficient documentation

## 2010-04-05 DIAGNOSIS — E785 Hyperlipidemia, unspecified: Secondary | ICD-10-CM | POA: Insufficient documentation

## 2010-04-05 DIAGNOSIS — R110 Nausea: Secondary | ICD-10-CM | POA: Insufficient documentation

## 2010-04-05 DIAGNOSIS — F411 Generalized anxiety disorder: Secondary | ICD-10-CM | POA: Insufficient documentation

## 2010-04-09 ENCOUNTER — Encounter (INDEPENDENT_AMBULATORY_CARE_PROVIDER_SITE_OTHER): Payer: Medicare Other | Admitting: Gynecology

## 2010-04-09 ENCOUNTER — Other Ambulatory Visit (HOSPITAL_COMMUNITY)
Admission: RE | Admit: 2010-04-09 | Discharge: 2010-04-09 | Disposition: A | Discharge status: Home / Self Care | Payer: Medicare Other | Source: Ambulatory Visit | Attending: Gynecology | Admitting: Gynecology

## 2010-04-09 ENCOUNTER — Other Ambulatory Visit: Payer: Self-pay | Admitting: Gynecology

## 2010-04-09 DIAGNOSIS — R1011 Right upper quadrant pain: Secondary | ICD-10-CM

## 2010-04-09 DIAGNOSIS — N801 Endometriosis of ovary: Secondary | ICD-10-CM

## 2010-04-09 DIAGNOSIS — E7800 Pure hypercholesterolemia, unspecified: Secondary | ICD-10-CM

## 2010-04-09 DIAGNOSIS — N949 Unspecified condition associated with female genital organs and menstrual cycle: Secondary | ICD-10-CM

## 2010-04-09 DIAGNOSIS — E559 Vitamin D deficiency, unspecified: Secondary | ICD-10-CM

## 2010-04-09 DIAGNOSIS — Z124 Encounter for screening for malignant neoplasm of cervix: Secondary | ICD-10-CM | POA: Insufficient documentation

## 2010-04-15 ENCOUNTER — Institutional Professional Consult (permissible substitution) (INDEPENDENT_AMBULATORY_CARE_PROVIDER_SITE_OTHER): Payer: Medicare Other | Admitting: Gynecology

## 2010-04-15 ENCOUNTER — Institutional Professional Consult (permissible substitution): Payer: Medicare Other | Admitting: Gynecology

## 2010-04-15 DIAGNOSIS — N831 Corpus luteum cyst of ovary, unspecified side: Secondary | ICD-10-CM

## 2010-04-15 DIAGNOSIS — N949 Unspecified condition associated with female genital organs and menstrual cycle: Secondary | ICD-10-CM

## 2010-04-15 DIAGNOSIS — N8300 Follicular cyst of ovary, unspecified side: Secondary | ICD-10-CM

## 2010-04-22 LAB — GLUCOSE, CAPILLARY
Glucose-Capillary: 109 mg/dL — ABNORMAL HIGH (ref 70–99)
Glucose-Capillary: 109 mg/dL — ABNORMAL HIGH (ref 70–99)
Glucose-Capillary: 126 mg/dL — ABNORMAL HIGH (ref 70–99)
Glucose-Capillary: 127 mg/dL — ABNORMAL HIGH (ref 70–99)
Glucose-Capillary: 131 mg/dL — ABNORMAL HIGH (ref 70–99)
Glucose-Capillary: 88 mg/dL (ref 70–99)
Glucose-Capillary: 98 mg/dL (ref 70–99)
Glucose-Capillary: 98 mg/dL (ref 70–99)

## 2010-04-22 LAB — CBC
Platelets: 248 10*3/uL (ref 150–400)
RDW: 13.1 % (ref 11.5–15.5)
WBC: 9.9 10*3/uL (ref 4.0–10.5)

## 2010-04-22 LAB — URINALYSIS, ROUTINE W REFLEX MICROSCOPIC
Glucose, UA: NEGATIVE mg/dL
Ketones, ur: NEGATIVE mg/dL
Nitrite: NEGATIVE
Protein, ur: NEGATIVE mg/dL
Urobilinogen, UA: 1 mg/dL (ref 0.0–1.0)

## 2010-04-22 LAB — RAPID URINE DRUG SCREEN, HOSP PERFORMED
Amphetamines: NOT DETECTED
Benzodiazepines: POSITIVE — AB
Cocaine: NOT DETECTED
Tetrahydrocannabinol: NOT DETECTED

## 2010-04-22 LAB — ETHANOL: Alcohol, Ethyl (B): <5 mg/dL (ref 0–10)

## 2010-04-22 LAB — HEMOGLOBIN A1C
Hgb A1c MFr Bld: 5.3 % (ref 4.6–6.1)
Mean Plasma Glucose: 105 mg/dL

## 2010-04-22 LAB — COMPREHENSIVE METABOLIC PANEL
AST: 17 U/L (ref 0–37)
Albumin: 4.3 g/dL (ref 3.5–5.2)
Chloride: 108 mEq/L (ref 96–112)
Creatinine, Ser: 0.76 mg/dL (ref 0.4–1.2)
GFR calc Af Amer: >60 mL/min (ref >60)
Potassium: 3.3 mEq/L — ABNORMAL LOW (ref 3.5–5.1)
Total Bilirubin: 0.6 mg/dL (ref 0.3–1.2)
Total Protein: 7 g/dL (ref 6.0–8.3)

## 2010-04-22 LAB — LIPID PANEL
Cholesterol: 109 mg/dL (ref 0–200)
HDL: 40 mg/dL (ref >39)
LDL Cholesterol: 41 mg/dL (ref 0–99)
Total CHOL/HDL Ratio: 2.7 RATIO
Triglycerides: 142 mg/dL (ref <150)
VLDL: 28 mg/dL (ref 0–40)

## 2010-04-22 LAB — T4, FREE: Free T4: 1.2 ng/dL (ref 0.80–1.80)

## 2010-04-22 LAB — DIFFERENTIAL
Basophils Absolute: 0.1 10*3/uL (ref 0.0–0.1)
Eosinophils Relative: 1 % (ref 0–5)
Lymphocytes Relative: 24 % (ref 12–46)
Monocytes Absolute: 0.4 10*3/uL (ref 0.1–1.0)
Monocytes Relative: 4 % (ref 3–12)

## 2010-04-22 LAB — TSH: TSH: 0.389 u[IU]/mL (ref 0.350–4.500)

## 2010-04-24 LAB — RAPID URINE DRUG SCREEN, HOSP PERFORMED
Barbiturates: NOT DETECTED
Cocaine: NOT DETECTED
Opiates: NOT DETECTED

## 2010-04-24 LAB — CBC
MCHC: 34.1 g/dL (ref 30.0–36.0)
MCV: 92.4 fL (ref 78.0–100.0)
Platelets: 269 10*3/uL (ref 150–400)
RDW: 14.1 % (ref 11.5–15.5)

## 2010-04-24 LAB — DIFFERENTIAL
Eosinophils Absolute: 0 10*3/uL (ref 0.0–0.7)
Eosinophils Relative: 0 % (ref 0–5)
Lymphocytes Relative: 34 % (ref 12–46)
Lymphs Abs: 2.6 10*3/uL (ref 0.7–4.0)
Monocytes Relative: 7 % (ref 3–12)

## 2010-04-24 LAB — COMPREHENSIVE METABOLIC PANEL
ALT: 14 U/L (ref 0–35)
AST: 13 U/L (ref 0–37)
CO2: 22 mEq/L (ref 19–32)
Calcium: 9.5 mg/dL (ref 8.4–10.5)
Creatinine, Ser: 0.68 mg/dL (ref 0.4–1.2)
GFR calc Af Amer: >60 mL/min (ref >60)
GFR calc non Af Amer: >60 mL/min (ref >60)
Sodium: 138 mEq/L (ref 135–145)
Total Protein: 7.2 g/dL (ref 6.0–8.3)

## 2010-04-24 LAB — URINALYSIS, ROUTINE W REFLEX MICROSCOPIC
Hgb urine dipstick: NEGATIVE
Nitrite: NEGATIVE
Specific Gravity, Urine: 1.017 (ref 1.005–1.030)
Urobilinogen, UA: 1 mg/dL (ref 0.0–1.0)
pH: 6 (ref 5.0–8.0)

## 2010-04-24 LAB — SALICYLATE LEVEL: Salicylate Lvl: <4 mg/dL (ref 2.8–20.0)

## 2010-04-24 LAB — LIPASE, BLOOD: Lipase: 46 U/L (ref 11–59)

## 2010-04-24 LAB — ACETAMINOPHEN LEVEL: Acetaminophen (Tylenol), Serum: <10 ug/mL — ABNORMAL LOW (ref 10–30)

## 2010-04-28 ENCOUNTER — Ambulatory Visit (INDEPENDENT_AMBULATORY_CARE_PROVIDER_SITE_OTHER): Payer: Medicare Other

## 2010-04-28 ENCOUNTER — Inpatient Hospital Stay (INDEPENDENT_AMBULATORY_CARE_PROVIDER_SITE_OTHER)
Admission: RE | Admit: 2010-04-28 | Discharge: 2010-04-28 | Disposition: A | Discharge status: Home / Self Care | Payer: Medicare Other | Source: Ambulatory Visit | Attending: Emergency Medicine | Admitting: Emergency Medicine

## 2010-04-28 DIAGNOSIS — M545 Low back pain, unspecified: Secondary | ICD-10-CM

## 2010-04-28 DIAGNOSIS — M5430 Sciatica, unspecified side: Secondary | ICD-10-CM

## 2010-04-29 LAB — URINALYSIS, ROUTINE W REFLEX MICROSCOPIC
Bilirubin Urine: NEGATIVE
Glucose, UA: NEGATIVE mg/dL
Hgb urine dipstick: NEGATIVE
Ketones, ur: NEGATIVE mg/dL
Specific Gravity, Urine: 1.004 — ABNORMAL LOW (ref 1.005–1.030)
pH: 7.5 (ref 5.0–8.0)

## 2010-05-03 ENCOUNTER — Ambulatory Visit (INDEPENDENT_AMBULATORY_CARE_PROVIDER_SITE_OTHER): Payer: Medicare Other

## 2010-05-03 ENCOUNTER — Inpatient Hospital Stay (INDEPENDENT_AMBULATORY_CARE_PROVIDER_SITE_OTHER)
Admission: RE | Admit: 2010-05-03 | Discharge: 2010-05-03 | Disposition: A | Discharge status: Home / Self Care | Payer: Medicare Other | Source: Ambulatory Visit | Attending: Family Medicine | Admitting: Family Medicine

## 2010-05-03 DIAGNOSIS — M79609 Pain in unspecified limb: Secondary | ICD-10-CM

## 2010-05-03 DIAGNOSIS — S7000XA Contusion of unspecified hip, initial encounter: Secondary | ICD-10-CM

## 2010-05-07 ENCOUNTER — Encounter (INDEPENDENT_AMBULATORY_CARE_PROVIDER_SITE_OTHER): Payer: Medicare Other

## 2010-05-07 DIAGNOSIS — Z780 Asymptomatic menopausal state: Secondary | ICD-10-CM

## 2010-05-07 LAB — POCT I-STAT, CHEM 8
BUN: 8 mg/dL (ref 6–23)
Chloride: 111 mEq/L (ref 96–112)
Creatinine, Ser: 1.8 mg/dL — ABNORMAL HIGH (ref 0.4–1.2)
HCT: 34 % — ABNORMAL LOW (ref 36.0–46.0)
Hemoglobin: 11.6 g/dL — ABNORMAL LOW (ref 12.0–15.0)
Potassium: 3.5 mEq/L (ref 3.5–5.1)
Potassium: 4.1 mEq/L (ref 3.5–5.1)
Sodium: 139 mEq/L (ref 135–145)
Sodium: 141 mEq/L (ref 135–145)
TCO2: 17 mmol/L (ref 0–100)
TCO2: 23 mmol/L (ref 0–100)

## 2010-05-07 LAB — URINALYSIS, ROUTINE W REFLEX MICROSCOPIC
Nitrite: NEGATIVE
Protein, ur: NEGATIVE mg/dL
Specific Gravity, Urine: 1.002 — ABNORMAL LOW (ref 1.005–1.030)
Urobilinogen, UA: 1 mg/dL (ref 0.0–1.0)

## 2010-05-07 LAB — POCT PREGNANCY, URINE: Preg Test, Ur: NEGATIVE

## 2010-05-07 LAB — URINE CULTURE

## 2010-06-04 NOTE — Assessment & Plan Note (Signed)
[facility]                                 ON-CALL NOTE   OLAMAE, FERRARA                          MRN:          [id number]  DATE:03/27/2007                            DOB:          1960/05/18    PHONE NUMBER:  [phone].  DATE OF CALL:  March 27, 2007.  TIME OF CALL:  6:30 P.M.   HISTORY:  I got a call this Saturday evening from [facility]  complaining of cramping and severe pain in her hips and legs  bilaterally.  She tells me that she underwent colonoscopy and upper  endoscopy with Dr. Sheryn Bison 3 days ago.  The day after her  procedures she was having some breathing difficulties and spent a  considerable amount of time in [facility] emergency room.  She says  she called our answering service this evening because she did not know  who else to call.  She is having no vomiting, abdominal pain, fever,  bleeding or change in bowel habits.  She asks me about use of Vicodin  for pain.  She tells me that her family physician looks at her with  disbelief when she complains of pain.  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 I told her  that I was sorry that she was feeling poorly but I did not feel this was  a gastrointestinal issue or an issue at all related to her procedural  work.  I deferred any medical decision making to her family doctor who I  asked her to call immediately.  She understood.     Wilhemina Bonito. Marina Goodell, MD  Electronically Signed    JNP/MedQ  DD: 03/27/2007  DT: 03/28/2007  Job #: [id number]   cc:   Vania Rea. Jarold Motto, MD, Caleen Essex, FAGA

## 2010-06-04 NOTE — H&P (Signed)
[name], [name] NO.:  [id number]   MEDICAL RECORD NO.:  [id number]          PATIENT TYPE:  OBV   LOCATION:  1511                         FACILITY:  [facility]   PHYSICIAN:  Della Goo, M.D. DATE OF BIRTH:  04/14/1960   DATE OF ADMISSION:  07/07/2007  DATE OF DISCHARGE:                              HISTORY & PHYSICAL   PRIMARY CARE PHYSICIAN:  Unassigned.   CHIEF COMPLAINT:  Increased confusion.   HISTORY OF PRESENT ILLNESS:  This is a 50 year old female with a history  of bipolar disorder and multiple evaluations and admissions to  behavioral Health secondary to psychoses.  Emergency medical services  were called to the patient's home and the patient was sent to the  emergency department.  In the emergency department, the patient was  confused and unable to give a history.  The patient's son did arrive and  gave the history that the patient has been off of her psychiatric  medications for approximately one week and had been becoming  increasingly agitated and confused.  The patient's son reports that his  mother has had multiple similar episodes previously and has been  admitted for them in the past.   PAST MEDICAL HISTORY:  1. Bipolar disorder.  2. Type 2 diabetes mellitus.  3. Hyperlipidemia.  4 . Diabetic neuropathy.  1. Chronic back pain/degenerative disk disease.   MEDICATIONS:  Will need to be further verified.  The current medications  reported were:  1. Metformin 500 mg one p.o. q.  2. Gabapentin 600 mg one p.o. q.i.d.  3. Lyrica 75 mg one p.o. t.i.d.  4. Lipitor 20 mg one p.o. q.h.s.  5. Diazepam 10 mg one p.o. q.i.d.  6. Ibuprofen 800 mg p.o. t.i.d. p.r.n. pain.  7. Flexeril 10 mg one p.o. q.h.s.  8. Omeprazole 20 mg one p.o. daily.  9. Levimir insulin also appears on her record, there is not a dose      listed.   SOCIAL HISTORY:  The patient is a smoker, nondrinker and no reported  illicit drug usage.   FAMILY HISTORY:   Noncontributory.   PHYSICAL EXAMINATION FINDINGS:  GENERAL:  This is a 50 year old obese  female who is currently confused and agitated but in no acute distress.  VITAL SIGNS: Temperature 96.6, blood pressure 119/63, heart rate 80,  respirations 16-20, O2 saturations 97-98%.  HEENT:  Examination normocephalic, atraumatic.  Pupils sluggish but  reactive to light bilaterally.  Extraocular movements are intact.  Funduscopic benign.  Oropharynx is clear.  NECK:  Supple, full range of motion.  No thyromegaly, adenopathy,  jugulovenous distention.  CARDIOVASCULAR:  Regular rate and rhythm.  No murmurs, gallops or rubs.  LUNGS: Clear to auscultation bilaterally.  ABDOMEN:  Positive bowel sounds, soft, nontender, nondistended.  EXTREMITIES:  Without cyanosis, clubbing or edema.  NEUROLOGIC:  Obtunded.  The patient is able to move all four of her  extremities.   LABORATORY STUDIES:  White blood cell count 15.7, hemoglobin 16.1,  hematocrit 46.9, platelets 322, neutrophils 66%, lymphocytes 26%.  Urinalysis negative.  Sodium 141, potassium 3.3, chloride 105, bicarb  19, BUN 5, creatinine 1.13, glucose 112, calcium 9.9, urine HCG  negative, urine drug screen positive for benzodiazepines which are  prescribed alcohol level less than five.  Urinalysis negative.  Chest x-  ray reveals no acute disease process.  CT of the head is negative for  any acute findings.  Mild atrophy is present.   ASSESSMENT:  A 50 year old female being admitted with:  1. Altered mental status, psychosis versus acute delirium.  2. Mild hypokalemia.  3. Type 2 diabetes mellitus.  4. Bipolar disorder.   PLAN:  The patient will be admitted to 23-hour observation.  A one-to-  one sitter has been ordered for the patient's safety.  The patient's  potassium will be repleted.  DVT and GI prophylaxis will be ordered.  Her medications will be further verified.  Sliding scale insulin  coverage has been ordered for elevated blood  sugars.  Psychiatry will  also be consulted for evaluation of this patient.      Della Goo, M.D.  Electronically Signed     HJ/MEDQ  D:  07/07/2007  T:  07/07/2007  Job:  [id number]

## 2010-06-04 NOTE — H&P (Signed)
Rebecca [name], Rebecca [name] NO.:  [id number]   MEDICAL RECORD NO.:  [id number]          PATIENT TYPE:  IPS   LOCATION:  0500                          [facility]  [facility]   PHYSICIAN:  Geoffery Lyons, M.D.      DATE OF BIRTH:  1960-03-01   DATE OF ADMISSION:  01/21/2007  DATE OF DISCHARGE:                       PSYCHIATRIC ADMISSION ASSESSMENT   This is a 50 year old female voluntarily admitted on January 21, 2007.   HISTORY OF PRESENT ILLNESS:  The patient reports with a history of  depression, anxiety, suicidal and homicidal thoughts, feeling  overwhelmed. Per the chart the patient was having suicidal plans to  light the house on fire and take an overdose.  The patient does state  briefly that she had called her daughter and telling her that she did  not want to live. Her daughter then called 9-1-1. She states her  significant stressor is that her ex-husband's wife is posting derogatory  statements about her on an Internet site called [employer].  She denies  any alcohol or drug use.   PAST PSYCHIATRIC HISTORY:  The patient was here in 2008 for the patient  was arrested while driving under the influence, having suicidal thoughts  and was psychotic. She states she sees Dr. Betti Cruz and reports she has a  history of bipolar disorder.   SOCIAL HISTORY:  She is a 50 year old female, has four children.  The  patient states that she was in jail for 3 days for an assault charge  that she states was dropped afterwards.  She was on probation for a 5-  year period. She is currently not on probation at this time.  She lives  alone and is on disability for her medical problems.   FAMILY HISTORY:  A possible bipolar disorder.   ALCOHOL AND DRUG HISTORY:  The patient smokes.  Denies any alcohol or  drug use.   PRIMARY CARE Aksel Bencomo:  Dr. Unknown Foley in [facility].   MEDICAL PROBLEMS:  Hyperlipidemia, hypertension, degenerative disk  disease, neuropathy, and insulin-dependent diabetes  mellitus.   MEDICATIONS LISTED:  Lipitor 20 mg daily, Ambien 10 at bedtime, Tricor  145 mg daily, Motrin 800 mg t.i.d. with meals, Valium 10 mg every 4  hours, gabapentin 300 mg t.i.d., Lyrica 75 mg t.i.d., hydrocodone 7.5  one to two p.r.n. Depakote 250 t.i.d., Flexeril 10 daily.   DRUG ALLERGIES:  SULFA.   PHYSICAL EXAM:  GENERAL APPEARANCE:  This is a middle-aged female was  fully assessed at [facility] Emergency Department. Her physical exam was  reviewed with no significant findings.  The patient at this time is  resting in bed, does have a cane had noted at her bedside for  ambulation.  VITAL SIGNS:  Her temperature is 97.8, 87 heart rate, 20 respirations,  blood pressure is 96/70, 100% saturated, 5 feet 3 inches tall, 191.5  pounds.   LABORATORY RESULTS:  On laboratory data TSH is 0.273.  Alcohol level  less than 5. Glucose of 110.  Urine drug screen is positive for  benzodiazepines. Hemoglobin 15.8.   MENTAL STATUS EXAM:  She is a middle-aged female in bed.  Her eyes are  closed.  She had to be awakened several times to obtain responses.  Her  speech is soft-spoken.  Again one-word answers. Her mood is sleepy and  depressed.  The patient's affect is sleepy.  She is denying any suicidal  thoughts at this time.  She seems aware of herself and situation.  She  is a poor historian due to her sedation.   IMPRESSION:  AXIS I:  Bipolar disorder with psychotic features.  AXIS II:  Deferred.  AXIS III:  Hypertension, noninsulin-dependent diabetes mellitus,  hyperlipidemia, degenerative disk disease and neuropathy.  AXIS V:  Current is 30.   PLAN:  To contract for safety.  We will stabilize mood and thinking.  The patient is considered a high fall risk.  Will check her blood sugars  t.i.d. and have sliding scale insulin available.  Will clarify her  medications.  We will continue to obtain more history and assess her  comorbidities. Will reinforce medication compliance and have a  family  session with her significant support group.  She is to follow up with  Dr. Betti Cruz.  Her tentative length of stay is 5-6 days.      Landry Corporal, N.P.      Geoffery Lyons, M.D.  Electronically Signed    JO/MEDQ  D:  01/22/2007  T:  01/22/2007  Job:  [id number]

## 2010-06-04 NOTE — Discharge Summary (Signed)
NAMEJAMERA, VANLOAN NO.:  [id number]   MEDICAL RECORD NO.:  [id number]          PATIENT TYPE:  IPS   LOCATION:  0500                          [facility]ACILITY:  [facility]   PHYSICIAN:  Geoffery Lyons, M.D.      DATE OF BIRTH:  03-Oct-1960   DATE OF ADMISSION:  01/21/2007  DATE OF DISCHARGE:  01/24/2007                               DISCHARGE SUMMARY   CHIEF COMPLAINT/HISTORY OF PRESENT ILLNESS:  This was the second  admission to [facility] for this 50 year old female  voluntarily admitted.  History of depression, anxiety, suicidal and  homicidal thoughts, feeling overwhelmed.  She was having plans to light  the house on fire and take an overdose.  She had called her daughter,  telling her that she did not want to live. Daughter called 911.  Her  significant stressor is that her ex-husband's wife is posting derogatory  statements about her on an Internet site called Facebook.   PAST PSYCHIATRIC HISTORY:  Was admitted in 2008 as she was arrested  while driving under the influence, having suicidal thoughts and claimed  psychotic symptoms.  She sees Dr. Betti Cruz.  History of bipolar disorder.  Denies any active use at this particular moment of alcohol or any other  substances.   MEDICAL HISTORY:  Hyperlipidemia, hypertension, degenerative disk  disease, neuropathy, insulin-dependent diabetes mellitus.   MEDICATIONS:  Lipitor 20 mg per day, Ambien 10 at bedtime for sleep,  Tricor 145 mg per day, Motrin 800 mg 3 times a day, Valium 10 mg every 4  hours as needed, Neurontin 300 mg 3 times a day, Lyrica 75 mg 3 times a  day, hydrocodone 7.5 one to two as needed, Depakote 250 three times a  day, Flexeril 10 mg per day.   PHYSICAL EXAMINATION:  Performed and failed to show any acute findings.   LABORATORY WORKUP:  TSH 0.273.  Valproic acid 40.2.  Glucose 110.  UDS  positive for benzodiazepines.  Hemoglobin 15.8.   MENTAL STATUS EXAM:  Reveals a female in bed, eyes  closed.  Had to be  awakened several times to obtain responses.  She is not as spontaneous.  Speech is very soft-spoken, decreased production, mostly one-word  answers.  Her mood is depressed.  Affect is depressed.  She was denying  any active suicidal or homicidal ideations.  There was no active  evidence of delusions.  No hallucinations.  Cognition well-preserved.   ADMITTING DIAGNOSES:  AXIS I:.  Major depressive disorder.  AXIS II:  No diagnosis.  AXIS III:  Hypertension, non-insulin-dependent diabetes mellitus,  hyperlipidemia, degenerative disk disease, neuropathy.  AXIS IV:  Moderate.  AXIS V:  GAF upon admission 30, high GAF in the last year 60.   COURSE IN THE HOSPITAL:  She was admitted, started in individual and  group psychotherapy.  We established a sliding scale of insulin.  She  was maintained on the Valium, the hydrocodone and the Flexeril.   As already stated, 50 year old female.  Endorsed ongoing conflict with  the ex-husband's new wife.  She found out that  she had been writing  things about her on her computer site, Facebook, got upset,  called the  police and the magistrate, but she was told that there was nothing she  could do.  This apparently has happened before.  She got upset and felt  suicidal, then endorsed that she was having issues with her son, who was  kicked out of the house due to his drug use.  Then she claimed that she  thinks that her ex-husband and the wife gave him the drugs.  She  continues to talk and she becomes more circumstantial.  There seems to  be evidence of an underlying paranoid process.  She was diagnosed  bipolar, but she does not agree with the diagnosis.  Claimed that she  could not come up with any symptoms to support this diagnosis.  Did say  that she had a nervous breakdown in 99.  Claims she woke up in the ED.  She denied having overdosed.  She saw Dr. Rozanna Box for years, then  Dr. Raquel James, who she claimed gave her the  wrong medication, and now sees  Dr. Betti Cruz.  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  Denies any substance abuse.  At the time of this evaluation,  she was alert, cooperative in bed.  She was eating breakfast.  Anxious,  depressed, upset with the ex-husband and his new wife, very  circumstantial.  Some underlying paranoia and no insight.   The diagnoses changed to bipolar disorder with psychotic features versus  psychotic disorder NOS.  She was started on Depakote 250 three times a  day.  She tolerated the medication well, and by January 4 she was  stable.  She was endorsing no suicidal or homicidal ideations, no  hallucinations or delusions.  She was wanting to go home.  She was going  to follow up with Dr. Betti Cruz.  She already had an appointment, but she  was going to call to see if they had a  cancellation.   DISCHARGE DIAGNOSES:  AXIS I:  Bipolar disorder, depressed, with  psychotic features.  AXIS II:  No diagnosis.  AXIS III:  Hypertension, non-insulin dependent diabetes mellitus,  hyperlipidemia, degenerative disk disease, neuropathy.  AXIS IV:  Moderate.  AXIS V:  GAF on discharge 50.   Discharged on Tricor 145 mg per day, Lipitor 20 mg per day, Ambien 10 at  bedtime as needed for sleep, Depakote 250 three times a day, Lyrica 25  mg 3 times a day, Flexeril 10 mg 3 times a day, hydrocodone 7.5/500 up  to 3 times a day as needed, Valium 5 mg up to 3 times a day.   Followup by Dr. Betti Cruz.      Geoffery Lyons, M.D.  Electronically Signed     IL/MEDQ  D:  02/12/2007  T:  02/13/2007  Job:  [id number]

## 2010-06-04 NOTE — Assessment & Plan Note (Signed)
[facility]                         GASTROENTEROLOGY OFFICE NOTE   KORIANA, STEPIEN                        MRN:          [id number]  DATE:02/05/2007                            DOB:          [birth year]    [name] is a 50 year old with chronic bipolar disorder and a history  of previous psychosis who is referred today for evaluation of multiple  GI complaints.  She has diffuse abdominal pain with otherwise negative  workup, including multiple recent emergency room visits.  She is  followed from a psychiatric standpoint by Dr. Betti Cruz.   [name] complains mostly of a right flank pain for 6 months in her  right flank area and radiates around to her back.  She says it is  immediately worse with eating, within minutes, and it is described as a  crampy pain with nausea, but no emesis or change in bowel habits.  There  is no real alleviating element, and she has been in the emergency room  on several occasions.  She has had a negative pelvic CT scan.  This pain  does not occur every day and is not associated with any specific  hepatobiliary complaints.  She gives no known history of hepatic or  gallbladder disease, but does give a vague history of stomach ulcers.  She has chronic acid reflux symptoms with rather typical regurgitation  and burning, and also describes some intermittent dysphagia with solids  and liquids.  She additional has a history of panic attacks with  palpitations and shortness of breath.  She has mild functional  constipation but takes laxatives and is currently having regular bowel  movements without melena or hematochezia.   On speaking with the patient, she apparently had a back injury at work a  year ago, and was in a disability settlement.  When she was put back to  work, within days she was exposed to a chemical, and from what I can  ascertain, is now trying to tie her current situation into further work-  related episodes.   She denies any specific genitourinary complaints,  fever, chills, but has had anorexia and allegedly a 69-pound weight loss  over the last 6 months.  She certainly has no symptoms of malabsorption.   All I have today on the patient is recent emergency room visit notes and  notes from her gynecologist.  Has had negative GYN and transabdominal,  and transvaginal ultrasound on January 31, 2007, also in June of 2008  she had an abdominal CT scan and pelvic CT scans which were  unremarkable.  CBC and metabolic profiles have been normal, including  liver function tests.  Recently saw Dr. Audie Box in January of this year  and was treated for a yeast vaginitis, but otherwise felt that her exam  was unremarkable and referred to GI for evaluation.   PAST MEDICAL HISTORY:  Remarkable for chronic anxiety and depression,  bipolar disorder, and previous psychosis.  Reviewing her chart, she has  seen numerous orthopedists in the past because of her chronic back pain  and sciatic nerve irritation.  She  saw Dr. Myra Gianotti in 1992, and at  that time it was suggested that she have endoscopy and colonoscopy, but  I cannot see whether it was completed.  Additional problems include  multiple skin cancers, adult-onset diabetes mellitus, which is currently  requiring insulin for control, chronic headaches, recurrent kidney  stones, and previous hysterectomy.   MEDICATIONS:  1. Hydrocodone-APAP 7.5 mg 1 to 2 every 6 hours.  2. Ibuprofen 800 mg t.i.d.  3. Zolpidem 10 mg at bedtime.  4. Cyclobenzaprine 10 mg a day.  5. Valium 5 mg q.i.d.  6. Gabapentin 300 mg t.i.d.  7. Lyrica 75 mg 3 times a day.  8. Divalproex 350 mg t.i.d.  9. Fluconazole on a p.r.n. basis.  10.Flexeril 10 mg a day.  11.Levemir insulin 20 units at bedtime.  12.She previously was on Depakote, but she is not taking it at this      time.   She apparently has a history of SULFA allergy.   FAMILY HISTORY:  Remarkable for multiple  members with diabetes and  alcoholism.  There are no known gastrointestinal problems.   SOCIAL HISTORY:  She is divorced and lives by herself.  She allegedly  has some college education.  Is currently unemployed.  She smokes 1/2 to  1 pack of cigarettes a day.  She denies ethanol use or abuse.   REVIEW OF SYSTEMS:  Almost all categories circled on her sheet.  She  describes frequent cough, urination changes, arthritic pains, skin  rashes, insomnia, excessive thirst, vision changes, palpitations,  hematuria, chronic back pain, pruritus, headaches, et Karie Soda.  It makes  it difficult to sort these symptoms out with such a positive review of  systems.   EXAM:  She is a healthy-appearing white female who is obviously  agitated, but in no acute distress.  She is 5 feet 3 inches and weighs 194 pounds.  Blood pressure is 98/68  and pulse was 64 and regular.  I could not appreciate stigmata of chronic liver disease or thyromegaly.  CHEST:  Clear.  She was in a regular rhythm without murmurs, gallops, or rubs.  ABDOMEN:  Obese, but I could not appreciate any organomegaly, masses, or  tenderness.  Bowel sounds were entirely normal.  Peripheral extremities were unremarkable.  Mental status was normal.  RECTAL:  Unremarkable and there was soft stool present that was guaiac  negative.   ASSESSMENT:  I think it is unlikely that this patient has a  gastrointestinal source for her abdominal pain.  I think there is some  secondary gain from her current symptomatology and that she is trying to  tie a lot of these problems into her chemical exposure.  There is very  little if any GI symptomatology historically.  Her right flank pain  seems more musculoskeletal in nature than gastrointestinal in source.   RECOMMENDATIONS:  1. I have not given the patient any additional medications or elevated      her pain pills.  2. Will repeat abdominal and pelvic CT scan.  3. Consider endoscopy and colonoscopy to  be complete.  4. This patient probably needs good primary care followup and good      psychiatric followup.     Vania Rea. Jarold Motto, MD, Caleen Essex, FAGA  Electronically Signed    DRP/MedQ  DD: 02/05/2007  DT: 02/05/2007  Job #: [id number]   cc:   Marcial Pacas P. Fontaine, M.D.  Dr. Jeral Fruit  Daine Floras, M.D.

## 2010-06-04 NOTE — Letter (Signed)
April 27, 2007    [name]  [address]  [place], [place]  [address]   RE:  TINSLEY, EVERMAN  MRN:  [id number]  /  DOB:  1960/05/15   Dear Mrs. Scott:   This is to notify you that I am withdrawing from you gastroenterology  care due to multiple cancellations, and also what I feel is my inability  to adequately care for your complaints that seem to require frequent  ongoing primary care attention.  We will be glad to forward you the name  of gastroenterologists and primary care doctors in this area, if so  desired.  Again, I would also strongly suggest that you followup with  your psychiatrist, Dr. Betti Cruz, and that you establish primary care  followup in the near future.    Sincerely,      Vania Rea. Jarold Motto, MD, Caleen Essex, FAGA  Electronically Signed    DRP/MedQ  DD: 04/27/2007  DT: 04/27/2007  Job #: [id number]

## 2010-06-04 NOTE — Assessment & Plan Note (Signed)
[facility]                                 ON-CALL NOTE   [name], [name]                          MRN:          [id number]  DATE:03/28/2007                            DOB:          [birth year]    TELEPHONE NUMBER:  [phone]   TIME OF CALL:  10:18 pm Sunday March 8th 2009   Rebecca [name] called tonight with innumerable complaints. She had actually  called yesterday as well complaining of leg and hip cramping. She tells  me that she was in the emergency department all night last night. She  states that she went home and she is still having problems with her legs  and hips. She states that she is urinating on towels. She tells me  that her abdomen feels swollen. She did have colonoscopy and endoscopy  with Dr. Jarold Motto about five days ago. I told her that she needs to  contact her primary care physician regarding innumerable non-  gastrointestinal complaints. She said that she tried, but her doctor  did not call her back. She stated that she calls me because the  endoscopy discharge paper said that if she had any complaints after her  procedure to contact the doctor on-call. I reassured her that after two  visits to the emergency department and five days since her procedures  that I seriously doubted that any symptom  she was currently  experiencing had anything to do with her procedural work last week. I  reinforced to her that she needs to contact  her primary care physician.  She was agreeable.     Rebecca Bonito. Marina Goodell, MD  Electronically Signed    JNP/MedQ  DD: 03/28/2007  DT: 03/29/2007  Job #: [id number]   cc:   Vania Rea. Jarold Motto, MD, Caleen Essex, FAGA

## 2010-06-04 NOTE — Discharge Summary (Signed)
NAMELIESE, DIZDAREVIC NO.:  [id number]   MEDICAL RECORD NO.:  [id number]          PATIENT TYPE:  OBV   LOCATION:  1511                         FACILITY:  [facility]   PHYSICIAN:  Isidor Holts, M.D.  DATE OF BIRTH:  09-Aug-1960   DATE OF ADMISSION:  07/06/2007  DATE OF DISCHARGE:  07/07/2007                               DISCHARGE SUMMARY   PMD:  Unassigned.   DISCHARGE DIAGNOSES:  1. Agitation/psychosis.  2. Bipolar disorder.  3. Medication noncompliance.  4. Dyslipidemia.  5. Type 2 diabetes mellitus with neuropathy.  6. History of degenerative joint disease/chronic back pain.   DISCHARGE MEDICATIONS:  Per psychiatry.   PROCEDURES:  1. Head CT scan dated July 06, 2007:  This showed question of very      mild atrophy, otherwise negative.  2. Chest x-ray dated July 07, 2007:  This showed no acute disease.   CONSULTATIONS:  1. Dr. Antonietta Breach, psychiatrist.   ADMISSION HISTORY:  As in H&P notes of July 07, 2007 dictated by Dr.  Della Goo.  However, in brief, this is a 50 year old female, with  known history of bipolar disorder, status post multiple evaluations and  admissions to [facility] secondary to psychoses.  The  patient was brought to the emergency department by EMS because of  increased agitation, confusion and combativeness.  Reportedly, the  patient has been noncompliant with her medication.   CLINICAL COURSE:  The patient was hospitalized and worked up for  possible acute underlying medical problems, which may cause confusion.  Chest x-ray was negative.  Urinalysis was negative.  Drug screen was  positive only for benzodiazepines.  Head CT scan showed no acute  findings.  Her type 2 diabetes remained controlled.  As a matter of  fact, CBG on July 07, 2007 a.m. was 121-116.  There were no problems  referable to back pain.  The patient has required intermittent  injections of Haldol and Ativan to maintain control, and she  continued  to be combative.  Psychiatric consultation has been requested, as there  appears to be no acute medical problems at this time and further  management will depend on psychiatrist recommendations.   DISPOSITION:  The patient is considered medically cleared for transfer  to [facility], or otherwise as recommended by  psychiatrist.   The following pre-admission medication need to continue.  1. Metformin 500 mg p.o. daily.  2. Gabapentin 600 mg p.o. q.i.d.  3. Lyrica 75 mg p.o. t.i.d.  4. Lipitor 20 mg p.o. nightly.  5. Flexeril 10 mg p.o. nightly.  6. Omeprazole 20 mg p.o. daily.      Isidor Holts, M.D.  Electronically Signed     CO/MEDQ  D:  07/07/2007  T:  07/07/2007  Job:  [id number]

## 2010-06-04 NOTE — Assessment & Plan Note (Signed)
[name] is a 50 year old, white female who is being seen in our [facility] for multiple pain complaints which are  attributed to mild degenerative disc disease as well as early right knee  degenerative joint disease and an overlay of fibromyalgia.   She is back in today, apparently, she has had a recent ER visit for a  syncopal episode with some EKG changes and weakness.  Apparently, this  may have been attributed to a new psychiatric medication which she was  placed on.  She is in today and states that she, apparently, was taken  off that particular drug and has currently been on Seroquel now for  about 2 weeks.   She states today, I think my fibromyalgia is catching on really bad.  She states her average pain is about an 8 on a scale of 10; a pain  diagram is essentially colored in from the posterior neck to the toes  throughout the legs and midback, buttocks, and upper extremities.  She  states the pain is typically worse with most activities including  sitting and inactivity, improves with rest and heat and ice and  medication and injection.   She reports a little relief with current meds that she is on.  She  denied engaging in any kind of a walking program in the interim.  She  felt that her function was diminished because of her medication changes  over the last several weeks regarding her psychiatric medications.   She is able to climb stairs.  She is not currently driving.  She states  she requires assistance with dressing, bathing, toileting, all types of  household duties, and shopping.  She states she can feed herself  independently.  She admits to a variety of neurologic symptoms including  numbness, tremors, tingling, trouble walking, spasms, dizziness,  confusion, anxiety, depression but denies suicidal ideations.  Review of  systems is otherwise noncontributory for this visit.   PAST MEDICAL HISTORY:  Unchanged.   SOCIAL HISTORY:  Unchanged.   She continues to smoke less than 1/2 a pack  of cigarettes per day.   FAMILY HISTORY:  Unchanged.   EXAMINATION:  Today, her blood pressure is 90/60, pulse 78, respirations  16, 96% saturation on room air.  She is a well-developed, well-  nourished, obese female who appears her stated age.  She does not appear  in any distress.  She does appear somewhat tired.  However, her affect  is alert and cooperative.  She is oriented x3 and she follows commands  without difficulty.  Her speech is clear.  She transitions from sitting  to standing rather stiffly.  Her gait in the room is nonantalgic but  slow.  She displays limitations in cervical range of motion as well as  lumbar range of motion and displays significant pain behaviors  throughout these maneuvers.  Her reflexes are intact and symmetric in  the upper and lower extremities.  Motor strength is nonfocal.  No new  sensory deficits are appreciated.  Straight leg raise is negative.  She  has multiple tender areas to palpation over the anterior chest wall,  medial elbows, lateral hips, medial knees, and throughout the  parascapular region and lumbar region.   IMPRESSION:  1. Fibromyalgia, overlay of mildly degenerative disc changes.  2. History of right knee early degenerative joint disease.  3. History of bipolar disorder.  4. Nicotine addiction.  5. Diabetes mellitus type II.  6. History of mitral valve  prolapse.   PLAN:  Will decrease her Morphine from 50 mg of MS Contin three times a  day down to 30 mg of Avinza.  We will refill her Vicodin today to  7.5/750, one p.o. b.i.d. to t.i.d., #80 and we will refill her Mobic 7.5  mg, one p.o. b.i.d., #60.  Continue to monitor her for constipation or  gastrointestinal problems.  We will have her discontinue Robaxin at this  time.  I continue to encourage her to increase her activity level.  She  does state she would like to take some college courses.  She, in fact,  did look into trying  to get started this month, however, the medication  problems have made it difficult for her to pursue this.  She also  verbalizes she is interested in attending a community based exercise  program such as [employer].  I will continue to encourage her to increase  her activity levels at least walking several times a day.  She is still  taking Lyrica and is tolerating this as well.  She takes Prilosec and  does not need refills on either of these at this time.           ______________________________  Brantley Stage, M.D.     DMK/MedQ  D:  05/14/2006 13:06:16  T:  05/14/2006 14:04:51  Job #:  [id number]
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
## 2010-06-04 NOTE — Discharge Summary (Signed)
Rebecca [name], Rebecca [name]                 ACCOUNT NO.:  [id number]   MEDICAL RECORD NO.:  [id number]          PATIENT TYPE:  INP   LOCATION:  1417                         FACILITY:  [facility]   PHYSICIAN:  Tanya D. Daphine Deutscher, M.D.  DATE OF BIRTH:  [birth year]   DATE OF ADMISSION:  04/19/2006  DATE OF DISCHARGE:  04/20/2006                               DISCHARGE SUMMARY   FINAL DIAGNOSES:  1. Syncope.  2. Diabetes type 2.  3. Hypertension.  4. Manic-depressive disorder.   HOSPITAL COURSE:  Briefly, this is a 50 year old white female who  presented to the emergency department with a complaint of a syncopal  event 2 days prior to presentation.  She was seen in emergency  department twice and complained of generalized weakness.  She had an EKG  done which did show ischemic changes from a previous EKG in 2007.  Based  on that, she was admitted with the complaints of syncope and EKG  changes.  She was placed on the cardiac monitor to rule out any ischemic  myocardial events.  The patient was admitted to the hospital.  A serial  set of cardiac enzymes were drawn which were negative for three sets.  The patient did well as an inpatient, no EKG changes, no chest pain  noted during admission to the hospital.  She did complain of some  backache, which is a chronic condition for her.  She was discharged to  home in good condition with instructions to follow up with myself in 1  week and she was also instructed to resume her home medications, which  included:  1. Lithium 300 mg one q.8h., two q.p.m.  2. Diovan 160 mg daily.  3. Prilosec 20 mg daily.  4. Robaxin 400 mg t.i.d.  5. Avandia 30 mg b.i.d.  6. Ambien 10 mg q.h.s.  7. Mobic 7.5 mg b.i.d.  8. Lyrica 75 mg b.i.d.  9. Detrol 4 mg daily.  10.Klonopin 10 mg q.i.d.  11.Levemir 20 units subcu q.h.s.  12.Valium 10 mg t.i.d. p.r.n.           ______________________________  Cephas Darby. Daphine Deutscher, M.D.     TDM/MEDQ  D:  09/08/2006  T:  09/09/2006   Job:  [id number]

## 2010-06-04 NOTE — Assessment & Plan Note (Signed)
[facility]                         GASTROENTEROLOGY OFFICE NOTE   CHEYEANNE, ROADCAP                        MRN:          [id number]  DATE:                                      DOB:          [birth year]    ADDRESS:  [name]  [address]  [place], [place]   BODY:  CANCELLED DICTATION     Vania Rea. Jarold Motto, MD, Caleen Essex, FAGA     DRP/MedQ  DD: 05/14/2007  DT: 05/14/2007  Job #: [id number]

## 2010-06-04 NOTE — Assessment & Plan Note (Signed)
Rebecca [name] is a 50 year old single female who lives with her son.  She is  being seen in our [facility] for complaints of  cervical and lumbar pain as well as bilateral upper extremity and lower  extremity pain and she does carry a history of fibromyalgia as well.  She was last seen in clinic on Jun 11, 2006.  At that time she literally  ran out of the clinic against medical advice and has been hospitalized  for problems related to her psychiatric medications.   She is back in today requesting refills of her pain medications at this  time, specifically requesting refill on her long-acting morphine and her  hydrocodone.   She states that she has been off of these medications for several days  now and she also states that she is on some new psychiatric medications.  Although she did not bring in any of her bottles and seemed a bit  unclear which ones she was currently taking again, she did state she  believes she is taking Lyrica 75 mg twice a day, Valium 10 mg q.i.d.,  Lipitor and Detrol.   She states that her lithium level had been checked at [facility] and it  was elevated and she has been taken off many of the psychiatric  medications she had been on prior.   She is complaining about pain all over.  Her pain diagrams reflects  this.  She has marked both the front and the back of her pain diagram  over all of the limbs front and back and predominantly low back and  cervical region.   She does still complain of some newer neck pain, however reports it is  worse with flexion and extension.   Notes are reviewed through the [facility] system and I appreciate that there  may be a history of some medical noncompliance with respect to her  medications noted in the past.   She reports that she is still having quite a bit of problems with her  balance and she is requiring some assistance with some activities of  daily living.  She is, however, independent with feeding and  toileting  and may occasionally need some assistance with showering.  She states  she needs to hold on to something while she showers.   Dressing is independent, however, and she drove herself to her clinic  appointment today.   Denies problems controlling bowel or bladder.  Apparently she is  maintaining some contact with Dr. Pecola Leisure, who is a colleague of Dr.  Ermalene Searing who she used to see.   On exam today, her blood pressure is 111/74, pulse 69, respirations 16-  18, she is 98% saturated on room air.  She is a well-developed, obese  white female who appears her stated age.  She is oriented to person,  place and date today.  Her speech is clear, her affect is much brighter,  I am not seeing the lability that I saw several days ago.  She follows  commands without difficulty.   She is able to transition from sitting to standing without difficulty.  Her gait in the room is non-antalgic.  She does have difficulty,  however, with tandem gait and Romberg's test yet.  Her reflexes are  intact and present in the upper and lower extremities, her motor  strength is nonfocal.  Sensory exam is intact as well.   She has limitations in cervical range of motion and reports discomfort  again with  flexion, extension and rotation.   IMPRESSION:  1. Bipolar with recent changes in her medication, apparently has been      on lithium with an elevated level.  2. New cervicalgia.  3. History of fibromyalgia.  4. Mild lumbar degenerative changes.  5. History of right knee early degenerative joint disease.  6. History of diabetes mellitus times 10 years.  7. Nicotine addiction.   PLAN:  Approximately 30 minutes was spent discussing options with Ms.  [name].  At this point, given her history of medication noncompliance and  behaviors at the last clinic, we will manage her pain non-narcotically  using physical therapy, lumbar supports, acupuncture, topicals such as  Lidoderm and Biofreeze, muscle relaxers  to a smaller extent and  occasional nonsteroidal anti-inflammatory medication.   She states she understands the rationale behind this change in her  current pain management plan and is in agreement.   She has been off of narcotics for several days now.  I will go ahead and  reorder the cervical spine series that I ordered on the 22nd which she  did not have done yet.  Will see her back in a month.           ______________________________  Brantley Stage, M.D.     DMK/MedQ  D:  06/22/2006 14:30:39  T:  06/22/2006 17:24:16  Job #:  [id number]

## 2010-06-04 NOTE — Assessment & Plan Note (Signed)
[facility]                                 ON-CALL NOTE   Rebecca [name], Rebecca [name]                          MRN:          [id number]  DATE:03/23/2007                            DOB:          [birth year]    This is a patient of Dr. Sheryn Bison.   [name] called tonight.  She is having a colonoscopy and upper  endoscopy tomorrow.  She began taking her prep today and has had a  severe headache.  She used to get migraines fairly frequently, although  those seem to have tapered off in the past year or two.  She is a  diabetic and has been watching her sugars closely and says they have not  been low.  She also has right-sided pain, but this is pain that she has  been experiencing for many months, and she has been told this is  probably related to diabetic neuropathy type pains.  She said this right-  sided pain is not any more significant tonight.  She took two Tylenol  earlier tonight and that did not seem to help her pain.  She does have  some oxycodone at home and I recommended that she take one to two of the  oxycodone pills to see if that helps her headache and, if not, then to  call back.  I also urged her to stay hydrated as best as she can.  She  sounds like she is pushing liquids.  She was asked to call if she has  any further troubles.     [name], MD  Electronically Signed    DPJ/MedQ  DD: 03/23/2007  DT: 03/24/2007  Job #: [id number]   cc:   Vania Rea. Jarold Motto, MD, Caleen Essex, FAGA

## 2010-06-04 NOTE — H&P (Signed)
Rebecca [name], LEARN NO.:  [id number]   MEDICAL RECORD NO.:  [id number]          PATIENT TYPE:  IPS   LOCATION:  0300                          FACILITY:  [facility]   PHYSICIAN:  Anselm Jungling, MD  DATE OF BIRTH:  1960-10-18   DATE OF ADMISSION:  06/12/2006  DATE OF DISCHARGE:                       PSYCHIATRIC ADMISSION ASSESSMENT   This is an involuntary admission to the services of Dr. Geralyn Flash.   IDENTIFYING INFORMATION:  This is a 50 year old divorced white female.  She states, however, she is remarrying in June.  Apparently earlier, on  May 23, she was arrested up in [place] for driving while under the  influence.  Her son picked her up and brought her to [facility].  Her commitment papers indicate that she has a long  history for bipolar and is disabled from this.  She also has back pain,  but when seen at [facility] the patient was  incoherent, she was unstable and does not realize what she is doing or  where she is at.  Earlier in the day she had been driving her vehicle  while heavily medicated, and was arrested.  She had threatened suicide.  She falls asleep with cigarettes still burning in her hand.  She is  prescribed both psychopharmaceuticals and pain medications.  The  combination of meds is causing her to overdose and experience bipolar  stages.  She was noted to be hallucinating and delusional at the time  she was in [facility], and she believed that her  house had been robbed the night before.  She was hearing voices.  It was  reported that she sleeps about 75% of the day, and is bathing only when  reminded.  She has previously been committed to [facility] and [facility].  She  was last with [name] at [facility] back in August 15, 2005.   SOCIAL HISTORY:  She states she has had some college.  This will be her  5th marriage.  She is anticipating remarrying in June.  She has 4  children ages 69, 54, 76, and 78.  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   FAMILY HISTORY:  Her sister is bipolar according to her.  When she was  last seen in 2002 she reported that her father had a questionable  history for bipolar.  She continues to smoke 1 to 2 packs of cigarettes  per day and has a greater than 50-year pack/year history.   PRIMARY CARE [facility] [place]:  Dr. Pecola Leisure.   She is currently under psychiatric care with Dr. Jules Schick, last  seeing her 3 to 4 weeks ago.   MEDICAL PROBLEMS:  1. Hypertension.  2. Diabetes.  3. Asthma.  4. Chronic back pain.  5. Hypercholesterolemia.  6. Mitral valve prolapse.   CURRENT MEDICATIONS:  1. Lithium carbonate 300 mg 1 tab q.i.d.  2. Detrol LA 4 mg p.o. 1 cap p.o. q. day.  3. Meloxicam 75  mg p.o. b.i.d.  4. Robaxin 500 mg p.o. q.8 h p.r.n.  5. Propranolol 10 mg p.o. q.i.d.  6. Hydrocodone/APAP 7.5/750 one 2 to 3 times a day p.r.n.  7. Trazodone 100 mg at bedtime.  8. Omeprazole 20 mg p.o. q. day.  9. Diovan 160 mg p.o. q. day.  10.Lunesta 3 mg p.o. at bedtime.  11.Avinza 30 mg p.o. q. day.  12.Lipitor 20 mg p.o. q. day.  13.Lyrica 75 mg 1 tab t.i.d.   DRUG ALLERGIES:  SULFA.   POSITIVE PHYSICAL FINDINGS:  Other than being obese, she has no  remarkable physical findings at present.  She was medically cleared in  the ED and that was at [facility].  Her UDS was positive for  benzodiazepines.  Her alcohol level was less than 5, and she did have  many bacteria in her urine at that time.  Vital signs on admission to  our unit show that she is 51 inches tall.  She weighs 211 pounds.  Temperature 98.8.  Blood pressure 100/58, pulse 65, respirations 18.   MENTAL STATUS EXAM:  She is alert.  She is somewhat unkempt.  Her speech  is slow.  She reports feeling aggravated, however, she appears  depressed.  She was somewhat tearful.  She currently denies  racing  thoughts.  She is rationale, goal-oriented.  She is requesting  discharge.  Judgment and insight are poor.  Concentration and memory are  fair.  Intelligence is at least average.  She denies suicidal or  homicidal ideation.  She denies auditory/visual hallucinations.   DIAGNOSES:  AXIS I:  Bipolar, a history for panic with agoraphobia.  AXIS II:  Rule out personality disorder.  AXIS III:  Diabetes mellitus, hypertension, chronic back pain, asthma  and hypercholesterolemia.  She states she is followed by Dr. Gavin Potters at  the [facility] from [facility].  AXIS IV:  Problems with primary support group, housing, problems related  to legal system.  Receiving a ticket for driving while under the  influence May 23.  AXIS V:  30.   PLAN:  Admit for safety and stabilization.  We will adjust her meds as  indicated.  Toward that end, her psychiatric medications had already  been continued, but her pain meds were decreased, only continuing Lyrica  at this time, and we will have a family session to see if the patient is  back at her baseline.  She does have psychiatric care with Dr. Jules Schick, however, she is requesting a change.      Mickie Leonarda Salon, P.A.-C.      Anselm Jungling, MD  Electronically Signed    MD/MEDQ  D:  06/13/2006  T:  06/13/2006  Job:  [id number]

## 2010-06-04 NOTE — Consult Note (Signed)
Rebecca, [name] NO.:  [id number]   MEDICAL RECORD NO.:  [id number]          PATIENT TYPE:  IPS   LOCATION:  0307                          [facility]ACILITY:  [facility]   PHYSICIAN:  Ellie Lunch, M.D.      DATE OF BIRTH:  1960/11/30   DATE OF CONSULTATION:  06/16/2006  DATE OF DISCHARGE:                                 CONSULTATION   REQUESTING PHYSICIAN:  Jasmine Pang, M.D. at [facility].   REASON FOR CONSULTATION:  To evaluate the patient for lithium toxicity.   HISTORY OF PRESENT ILLNESS:  [name] is a pleasant 50 year old white  female with a past medical history of bipolar disorder that was admitted  to [facility] on a voluntary basis.  However, she was found to  have tremors, nystagmus as well as an ataxic gait.  A lithium level that  was checked was elevated at 3.36 on Jun 13, 2006.  Note, the patient has  been taking lithium for the past five years.  About five months ago, her  physician had increased her dose of lithium from 300 mg t.i.d. to 300 mg  q.i.d.  However, one month after changing the dose, the patient was  found to have an elevated lithium level and her physician went back on  the 300 mg t.i.d.  Also note the patient was also started on Diovan  about four months ago for hypertension.  Since then, the patient has  been experiencing chronic nausea, about two episodes of vomiting and  multiple episodes of loose stools as well as bilateral tremors for the  past four months.  She has never experienced any seizures or syncopal  episodes.  She also denies any headaches or focal neurological deficits.  Also note that the patient was recently started on Avinza 30 mg daily  about one month ago at her pain clinic.  Finally, about two weeks ago,  the patient was started on some antibiotics for her sinusitis.  After  that, the patient noticed a dramatic change in her balance.  She started  experiencing many episodes of imbalance as well as her  tremor, vomiting  and diarrhea got worse.  She, however, continued to take her lithium all  this time.   PAST MEDICAL HISTORY:  1. Bipolar disorder.  2. Chronic pain.  3. Hypertension.  4. Insomnia.  5. Dyslipidemia.  6. Peripheral neuropathy.  7. Tremors.  8. Mitral valve prolapse.  9. Asthma.   MEDICATIONS:  Prior to hospital admission,  1. Lithium carbonate 100 mg in the morning and 200 mg p.o. q.h.s.  2. Detrol LA 4 mg daily.  3. Meloxicam 75 mg b.i.d.  4. Robaxin 500 mg p.o. q.8h. p.r.n.  5. Propranolol 10 mg q.i.d.  6. Hydrocodone/APAP 7.5/750 mg, 1-2 three times a day p.r.n.  7. Trazodone 100 mg q.h.s.  8. Omeprazole 20 mg daily.  9. Diovan 160 mg daily.  10.__________ 2 q.h.s.  11.Avinza 30 mg daily.  12.Lipitor 20 mg a day.  13.__________  t.i.d.  14.Levemir 20 units at night.   ALLERGIES:  SULFA.  SOCIAL HISTORY:  She has been married before but however she is getting  married again in June.  This will be her fifth marriage.  She has four  kids.  She has been disabled from a bipolar disorder since 49.   FAMILY HISTORY:  Her sister also has bipolar disorder according to her  but noncontributory for any kind of neurological complaints.   SUBSTANCE HISTORY:  The patient smokes 1-2 packs of cigarettes per day  and has had greater than 50-pack year history.  Also note, the patient  has doubled her caffeine consumption in the past 2-4 months as well  which may also increase lithium levels.   REVIEW OF SYSTEMS:  As per history of present illness.   PHYSICAL EXAMINATION:  VITAL SIGNS:  Temperature 97.5, respirations 20,  pulse was not check however it was about 70 beats per minute on physical  examination, blood pressure has not been noted.  GENERAL:  The patient does not appear to be in any acute distress.  HEAD:  Normocephalic and atraumatic.  EENT:  Pupils are equal, round and reactive to light and accommodation.  There is horizontal nystagmus  bilaterally.  CARDIOVASCULAR:  Regular rate and rhythm.  No murmurs.  CHEST:  Clear to auscultation bilaterally with slightly decreased breath  sounds in the left lung base.  ABDOMEN:  Soft, obese, positive bowel sounds.  EXTREMITIES:  No clubbing, cyanosis, or edema.  NEUROLOGIC:  Alert and oriented x4.  Cranial nerves 2-12 are intact.  Muscle strength 5/5 in all extremities.  Reflexes 2+ in all extremities.  Sensations are intact except for decreased sensation in the lower  extremities, which is chronic according to her.  Cerebellar signs are  negative.  Gait is broad based.  Romberg sign was negative.   LABORATORY DATA:  Lithium level was 3.36 on Jun 13, 2006.  It is 1.46 on  Jun 16, 2006.  Sodium 143, potassium 4.1, chloride 115, bicarb 24,  glucose 127, BUN 8, creatinine 0.96.  __________ 60.   ASSESSMENT:  [name] is a 50 year old with history of bipolar disorder  on lithium therapy.  She most likely has been experiencing symptoms of  lithium toxicity in terms of her nausea, vomiting, diarrhea and tremors  for the past four months.  This may have resulted because of concomitant  usage of Diovan as well as a lot of her pain medications.  The symptoms  of lithium toxicity have further been exacerbated by patient doubling  her dose of caffeine and also taking an antibiotic about two weeks ago  which may have increased lithium level to a point where she started  experiencing imbalance as well as nystagmus in addition to the vomiting,  diarrhea and tremors.  The patient's level was elevated at 3.46 with  slight increase in her creatinine at 1.3 as well.  Today, her level  continues to be elevated but it is coming down.  It was 1.46.  I agree  with rechecking her level tomorrow and, if it is normal, she can be  discharged home without being on any lithium.  The patient has already  been scheduled to have a follow-up visit with her primary care physician in about one week.  If patient  continues to have neurological symptoms  one  month after discontinuing lithium, then a neurological workup can be  initiate per primary care physician.  Since patient's levels are coming  down and her kidney function has returned to normal, there is no  indication for dialysis for lithium toxicity.      Ellie Lunch, M.D.  Electronically Signed     BP/MEDQ  D:  06/16/2006  T:  06/16/2006  Job:  [id number]
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
## 2010-06-07 NOTE — Consult Note (Signed)
Rebecca [name], Rebecca [name]                           ACCOUNT NO.:  [id number]   MEDICAL RECORD NO.:  [id number]                   PATIENT TYPE:  INP   LOCATION:  2010                                 FACILITY:  [facility]   PHYSICIAN:  Sondra Come, D.O.                 DATE OF BIRTH:  08-03-1960   DATE OF CONSULTATION:  09/22/2001  DATE OF DISCHARGE:  08/23/2001                                   CONSULTATION   The patient returns to clinic today as scheduled for reevaluation.  She was  last seen on September 08, 2001 at which time she underwent repeat bilateral L5-  S1 facet joint injections.  The patient states that she has had improvement  in her low back pain.  Her pain is now described as moderate which  corresponds to a 4/10 on a subjective scale following the injections.  Her  pain is also described as tolerable.  She still has pain radiating down  both of her lower extremities which did not improve at all with the facet  joint injections.  She describes the radicular pain as radiating into the  center of her legs and it is hard to describe the actual distribution.  She  denies any bowel and bladder dysfunction.  She also has questions about  whether or not she can get back into some exercise program and we discussed  this.  Her function and quality of life indexes seem to have improved  modestly.  I review the health and history form and 14 point review of  systems.  The patient has continued to take Percocet, but ran out.  We  discussed transitioning over the nonnarcotic based pain medication as her  symptoms have improved.  I am still somewhat concerned about the lower  extremity radicular symptoms, however.   PHYSICAL EXAMINATION:  GENERAL:  Healthy appearing female in no acute  distress.  VITAL SIGNS:  Blood pressure 131/80, pulse 89, respirations 18, O2  saturation 96% on room air.  BACK:  Examination of the back reveals tenderness to palpation bilateral  lumbar paraspinals.  Range  of motion is guarded.  NEUROLOGIC:  Manual muscle testing is 5/5 bilateral lower extremities.  Sensory examination is intact to light touch bilateral lower extremities at  this time.  Muscle stretch reflexes are 2+/4 bilateral patellar, 1+/4  bilateral medial hamstrings and Achilles.   IMPRESSION:  1. Chronic low back pain with facet joint arthropathy/facet syndrome with     improved low back pain.  2. Bilateral lower extremity radicular symptoms, etiology uncertain.   PLAN:  1. Had a long discussion with the patient regarding further treatment     options.  At this point I will obtain an MRI of the lumbar spine without     contrast to evaluate for degenerative disk changes or herniated nucleus     pulposus.  2. Will begin Ultracet  one to two p.o. q.i.d. as needed for pain number 100     with one refill.  3. Will begin Neurontin 300 mg one p.o. q.h.s. x3 days, b.i.d. x3 days, then     t.i.d. number 90 with one refill.  The patient is also given nine sample     pills to get started.  4. The patient is to begin a walking program.  5.     The patient is to return to clinic after MRI.  Would consider lumbar     epidural steroid injection.  6. The patient was educated on the above findings and recommendations and     understands.  No barriers to communication.                                               Sondra Come, D.O.    JJW/MEDQ  D:  09/22/2001  T:  09/22/2001  Job:  [id number]   cc:   Tanya D. Daphine Deutscher, M.D.

## 2010-06-07 NOTE — Assessment & Plan Note (Signed)
HISTORY OF PRESENT ILLNESS:  [name] is a 50 year old white  female who is accompanied by her daughter today to our [facility].  She has a long history of low back pain dating  back to the 1980s and was last seen by me on September 17, 2005.   She had an injury to her low back in November of 2006 while she was  working at [employer] and lifting [place] dinners.   She has been followed by Dr. Ethelene Hal and initially was referred by Dr.  Margaretha Sheffield to our clinic.   She has since seen Dr. Stevphen Rochester briefly who referred her back to Dr.  Ethelene Hal.  Dr. Ethelene Hal has attempted to get her into [facility];  however, apparently they had seen her previously and had discharged her.   She is here now in our clinic for management of her low back and leg  pain.   She states her average pain in the low back is about an 8 on a scale of  10.  It is worse with activity and improves with rest, heat, and  medication.   MEDICATIONS:  Medications that she is currently on to manage her low  back pain include:  1. Lyrica 75 mg twice a day.  2. Avinza 30 mg once a day.  3. Meloxicam 7.5 mg twice a day.  4. She also takes Trazodone for sleep.   She has a history of bipolar disorder and is on multiple psychiatric  medications and is followed by Dr. Jamas Lav.  [name] states she  has had a tremor which is a result of some of her psychiatric  medications and is currently being treated for this by Dr. Raquel James.   [name] states that she is able to stay up walking about five minutes  at a time.  She is able to climb some stairs.  She is able to drive.  She is independent with her self-care.  She is able to make meals and  does some laundry, does some light housekeeping chores as well.  She  states that bending aggravates her low back.  She tries to do things at  a waist level but she is limited to working only for short periods at a  time.   She admits to some bladder control  problems for which she uses Detrol,  does not have any bowel control problems.  She does have a history of  IBS in the past and has had some diarrhea and constipation.   PAST MEDICAL HISTORY:  1. Remarkable for mitral valve prolapse.  2. History of skin cancer.  3. Diabetes, type 2 for 4-5 years.  4. Hypertension.   HABITS:  She denies illegal substance use.  She rarely drinks alcohol.  She does smoke between 1-1/2 to 2 packs of cigarettes a day.   CURRENT MEDICATIONS:  Singulair, Lipitor, Lithium, Valium, amoxicillin  on a p.r.n. basis for dental visits, Lunesta, albuterol nebulizer.  __________, Restasis, Klonopin, Levemir, Detrol, Propranolol, and  Diovan.   PHYSICAL EXAMINATION:  VITAL SIGNS:  On exam today her blood pressure is  109/59, pulse 64, respirations 16, 98% saturation on room air.  GENERAL:  She is an obese white female who appears her stated age.  She  is oriented x3 though she does not appear in any distress.  Her affect  is bright, alert.  She is cooperative and pleasant.  NEUROLOGIC:  She transitions from sit to stand  easily.  Her gait in the  room is normal.  She has some difficulty with tandem gait.  She has a  normal Romberg's test, however.  During Romberg's test she is noted to  have a bilateral upper extremity tremor, worse on the right than on the  left.   Lumbar range of motion is limited, especially in extension, but forward  flexion and lateral flexion are also somewhat compromised.  Seated, her  reflexes are symmetric and intact in the lower extremities.  Motor  strength is 5/5 at the hip flexors, knee extensors, dorsiflexors,  plantar flexors, EHL.  She has intact sensation to light touch in the  lower extremities today.  No abnormal tone is noted.  No clonus is  noted.  She is noted to have tremor, though, in all extremities.  Right  upper extremity seems to be worse than left upper extremity.   She does have tenderness in her lumbar paraspinal  muscles with  palpation, especially throughout both upper and lower lumbar paraspinal  muscles.   IMPRESSION:  1. History of work injury, December 11, 2004, lifting [place] dinners      at [employer].  2. History of mild degenerative disk disease, L2-3, L3-4.  Imaging      study not available for review.  3. Nicotine addiction.  4. History of bipolar disorder.  5. Bilateral hand tremor, possibly secondary to psychiatric      medications.  6. History of right knee early degenerative joint disease.  7. Diabetes mellitus, type 2.  8. History of mitral valve prolapse.   PLAN:  Will obtain urine drug screen.  Will refill her medications  today, Avinza 30 mg one p.o. daily, #30, and meloxicam 7.5 mg one p.o.  b.i.d., #60.   She does not need refills today on her Lyrica or her Trazodone.   [name] states that her pain is typically worse toward the end of the  day.  In the morning her pain is about a 3 or a 4 on a scale of 10.  It  is about a 5 or a 6 by noon, and by 4-5 p.m. it is between a 6 and a 7,  and in the evening it is up to an 8.  A review of her previous  functional capacity evaluation stated that the client demonstrated the  ability to perform at the sedentary light physical demand for occasional  lifting, lifting 15 pounds occasionally from 30 to 54 inches.   At our next visit we will discuss her restrictions further.  I would  like to review previous MRI scans or imaging studies as well as the  electrodiagnostic report.  We will see her back in one month.  Will  check her urine drug screen.  Will do narcotic pill counts at that time.           ______________________________  Brantley Stage, M.D.     DMK/MedQ  D:  02/02/2006 13:55:58  T:  02/02/2006 18:57:01  Job #:  [id number]

## 2010-06-07 NOTE — Consult Note (Signed)
NAMELAURICE, Rebecca [name] [name]                           ACCOUNT NO.:  [id number]   MEDICAL RECORD NO.:  [id number]                   PATIENT TYPE:  INP   LOCATION:  2010                                 FACILITY:  [facility]   PHYSICIAN:  Sondra Come, D.O.                 DATE OF BIRTH:  28-Sep-1960   DATE OF CONSULTATION:  DATE OF DISCHARGE:                  PHYSICAL MEDICINE & REHABILITATION CONSULTATION   HISTORY OF PRESENT ILLNESS:  The patient returns to clinic today sooner than  scheduled for reevaluation.  She was last seen on August 25, 2001 at which  time she underwent bilateral L5-S1 facet joint injections for lumbar facet  syndrome.  The patient states that post-injection she had four to five days  of complete relief of her lower back pain.  However, she started back to  work on Monday, August 30, 2001, where she was doing lots of sitting and  standing and feels like her symptoms have been aggravated by this.  She had  a terrible day yesterday.  She does not seem to be as bad today but she  continues to have severe lower back pain.  She denies any particular  component today but did have some radicular symptoms in her right lower  extremity yesterday similar to those that she had prior to the injections,  which actually were relieved with the injections.  Her pain today is 9:10 on  a subjective scale.  She has been taking Percocet 5 mg/325 mg as needed for  her pain and was given prescription for this on August 19, 2001 for #50 and  has approximately three pills remaining but did not bring her pills with  her.  She is not working this weekend and has time to rest.  She has taken  some over-the-counter ibuprofen which helped modestly.   I reviewed Health and History form and 14-point review of systems.  Functional and quality of life indices have declined.  Sleep is poor.  No  bowel or bladder dysfunction noted.   PHYSICAL EXAMINATION:  GENERAL:  Health female in no acute distress.  VITALS:  Blood pressure 102/61, pulse 77, respirations 18, O2 saturations  97% on room air.  BACK:  Reveals tenderness to palpation in bilateral lumbosacral region.  Range of motion is full to flexion without pain.  Extension is limited  secondary to discomfort.  Manual muscle testing if 5/5 bilateral lower  extremities.  SENSORY:  Examination is intact to light touch to bilateral lower  extremities at this time.  Muscle stretch reflexes are 2+/4 in bilateral  patellar, 1+/4 bilateral medial hamstrings and Achille's.   IMPRESSION:  1. Chronic lower back pain with facet joint arthropathy/facet syndrome with     positive response to injections.  However, they only lasted four to five     days now with exacerbation.   PLAN:  1. Prescribe Motrin 800 mg one p.o. t.i.d. as needed with food #90 without  refills.  2. Increase Percocet to 10 mg/650 mg one p.o. t.i.d. as needed for severe     pain #30 without refills.  Patient was counseled on the appropriate use     of this medication and understands to use this only t.i.d.  3. Continue using ice.  4.     Patient to return to clinic as scheduled next week for reevaluation.     Consider repeating facet injections.  5. Patient was educated on above findings and recommendations and     understands.  There were no barriers to communication.                                                    Sondra Come, D.O.    JJW/MEDQ  D:  09/03/2001  T:  09/04/2001  Job:  [id number]   cc:   Tanya D. Daphine Deutscher, M.D.

## 2010-06-07 NOTE — Assessment & Plan Note (Signed)
[name] is a 50 year old white female who is being seen in our pain  and rehabilitative clinic for chronic low back pain.  She also states  she has bilateral leg pain.   Further questioning, her low back pain is about an 8/10.  The leg pain  is about a 3/10.  Sitting is worse than standing for the low back,  although she does have some discomfort with prolonged standing.  She  describes the pain as fairly constant, sharp, burning, stabbing, aching  in nature.  It interferes a little bit with her general activity.  Interferes a little bit with relation to others.   Pain is worse towards the end of the day in the evening.  Pain is worse  with walking, bending, sitting, and activity, standing.  Improved with  rest, heat, pacing her activities, medication injections.   She gets a little relief with the current meds that she is on.   MEDICATIONS:  1. Avinza 30 mg 1 p.o. daily.  2. Lyrica 75 one p.o. b.i.d.  3. Mobic 7.5 mg 1 p.o. b.i.d.   She has received these from our clinic.  She also is requesting refills  on Robaxin and Mobic today.   She states that she is able to walk about 5 to 10 minutes at a time.  She is able to drive.  She spends her day cleaning up around the house,  doing dishes, laundry, making beds.   She states she is independent with self care and high-level household  activities.   REVIEW OF SYSTEMS:  Negative 14-point review of systems check.   She reports no new problems regarding her past medical history, social  history, or family history since last visit.  She continues to smoke  about a pack-and-a-half of cigarettes a day.  She has done this since  age 17.   She has recently been to the emergency room within the last day for back  pain and was given Dilaudid to help manage her pain.  She typically goes  to the emergency room for pain management every couple of months.   MRI reports are reviewed from January 22, 2005, which showed mild disk  desiccation at T12-L1, L1-L2, L3-L4 without any nerve compressive  lesions.  Previous EMG report was also noted in recent notes, which  essentially was a normal study of the lower extremity.  No evidence of  peripheral neuropathy or lumbosacral radiculopathy or plexopathy.   FCE was also reviewed.  FCE was done by Lawerance Cruel, states the  patient provided an invalid profile of maximal voluntary effort during  testing, manual muscle testing both right and left lower extremities  were 0/5 strength.  However, she was able to demonstrate a normal heel-  toe gait pattern and was able to ascend and descend stairs among other  activities, suggesting her muscle strength was greater than a 0/5.   IMPRESSION:  1. Mild degenerative disk disease T12-L1, L1-L2, L3-L4 per MRI January 22, 2005.  2. History of right knee early degenerative joint disease.  3. History of bipolar disorder.  4. Nicotine addiction.  5. Diabetes mellitus type 2.  6. History of mitral valve prolapse.   PLAN:  Will transition her off of Avinza.  Will trial her on MS-Contin  50 mg one p.o. nightly for 7 days in addition to Vicodin ES 7.5/750 two  p.o. b.i.d. for 14 days, then b.i.d. to t.i.d. p.r.n. back pain.  Would  like  to get her to the point where she really is only using about 7 days  of narcotics each month for flare-ups.  Will refill her Mobic 7.5 one  p.o. b.i.d. #60.  Would like to see this for use with flare-ups only as  well.  I will add Prilosec this month as well for her 20 mg 1 p.o. daily  #30, and will refill her Robaxin 750 mg 1 p.o. t.i.d. p.r.n. back spasm  #30.  I encouraged her to improve general lifestyle habits, such as  decreasing smoking, and involving her in a walking plan daily as she has  a fairly sedentary lifestyle and is overweight.  She states that her  urine drug screen was positive for codeine at the last visit, most  likely because she had taken some cough syrup, which had been  prescribed  for her.  We will see her back in 1 month.  Anticipate attempting to  wean down narcotic usage a bit more over the next couple of months and  make attempts to increase general activity levels as she does have a  fairly sedentary lifestyle.  Will see her back in a month.           ______________________________  Brantley Stage, M.D.     DMK/MedQ  D:  03/04/2006 16:04:43  T:  03/04/2006 18:14:14  Job #:  [id number]

## 2010-06-07 NOTE — Op Note (Signed)
NAME:  Rebecca [name], CAUDELL                           ACCOUNT NO.:  [id number]   MEDICAL RECORD NO.:  [id number]                   PATIENT TYPE:  OBV   LOCATION:  9399                                 FACILITY:  [facility]   PHYSICIAN:  Ivor Costa. Farrel Gobble, M.D.              DATE OF BIRTH:  [birth year]   DATE OF PROCEDURE:  03/24/2002  DATE OF DISCHARGE:                                 OPERATIVE REPORT   PREOPERATIVE DIAGNOSES:  Dysfunctional bleeding.   POSTOPERATIVE DIAGNOSES:  Dysfunctional bleeding.   PROCEDURE:  Total vaginal hysterectomy.   SURGEON:  Ivor Costa. Farrel Gobble, M.D.   ASSISTANT:  Katy Fitch, M.D.   ANESTHESIA:  Spinal, later converted to general.   IV FLUIDS:  1200 mL of lactated Ringer's.   ESTIMATED BLOOD LOSS:  50.   URINE OUTPUT:  Not available.   FINDINGS:  Normal uterus.  Right ovary is consistent with PCO.  The left is  surgically absent.   PATHOLOGY:  Uterus and cervix.   COMPLICATIONS:  None.   PROCEDURE:  The patient was taken to the operating room where spinal  anesthesia was placed and placed in the dorsal lithotomy.  Prepped and  draped in usual sterile fashion.  After adequate anesthesia was ensured a  sterile weighted speculum was placed in the vagina.  The cervix was  stabilized with a Jacobs tenaculum and injected circumferentially with 10 mL  of dilute Pitressin solution of 20 and 100.  The vaginal mucosa was then  scored circumferentially and bluntly dissected off.  The cervix was elevated  and a posterior colpotomy was created sharply after which a long, sterile  weighted speculum was placed in the posterior cul-de-sac.  The patient was  unable to tolerate Trendelenburg position secondary to her heavy smoking and  obesity and unfortunately began coughing uncontrollably.  The surgery needed  to be halted while she attempts to stop the coughing were made and  eventually she was then converted to general after which the uterosacral  ligaments were  clamped, transected, and suture ligated in Heaney stitch with  0 Vicryl and held for identification later.  This was done bilaterally.  The  cardinal ligaments were then also transected and suture ligated with 0  Vicryl.  The bladder was dissected off anteriorly.  It did, however, retract  beyond the examining finger so the pedicles were taken with careful  attention anteriorly.  The uterine vessels were then transected and suture  ligated.  Again, this was done bilaterally with 0 Vicryl.  Eventually, the  dissection carried through and we were able to reach the bladder reflection  that had been deviated cephalad and anterior colpotomy was then performed.  The dissection was carried up bilaterally.  The tubo-ovarian ligament on the  left was then transected and suture ligated after a free tie was placed.  The right side was similarly treated.  The specimen was then  passed off the  field.  The pedicles were then inspected and all were noted to be  hemostatic.  There was a small amount of bleeding noted at the peritoneum,  the vaginal mucosa anteriorly and this was treated with cautery and then a  figure-of-eight was placed and hemostasis was then assured.  The pelvis was  then irrigated and noted to be dry.  A culdoplasty stitch was placed with 2-  0 Vicryl.  The posterior peritoneum and vaginal mucosa were then plicated  with 0 Vicryl from uterosacral to uterosacral after which a culdoplasty  stitch with 2-0 Vicryl was placed going from uterosacral to uterosacral and  held for tying later.  Reinspection of the vagina shows hemostasis.  The  vaginal cuff was then closed with interrupted 0 Vicryl.  The uterosacral  ligaments were cut.  The culdoplasty stitch was tied.  The vagina was  irrigated and again appeared to be hemostatic.  The Foley was placed and the  bladder was then drained postoperatively.  The patient tolerated the  procedure well.  Sponge, lap, and needle counts were correct x2.   She was  given an amp of gentamicin for questionable mitral valve prolapse and  transferred to the PACU in stable condition.                                               Ivor Costa. Farrel Gobble, M.D.    THL/MEDQ  D:  03/24/2002  T:  03/24/2002  Job:  [id number]
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
## 2010-06-07 NOTE — Consult Note (Signed)
Rebecca [name], Rebecca [name]                           ACCOUNT NO.:  [id number]   MEDICAL RECORD NO.:  [id number]                   PATIENT TYPE:  INP   LOCATION:  2010                                 FACILITY:  [facility]   PHYSICIAN:  Sondra Come, D.O.                 DATE OF BIRTH:  May 27, 1960   DATE OF CONSULTATION:  DATE OF DISCHARGE:  08/23/2001                  PHYSICAL MEDICINE & REHABILITATION CONSULTATION   REASON FOR CONSULTATION:  The patient returns to clinic today for bilateral  lumbar facet injections.  She was initially seen on 08/19/01.  She continues  to complain of pain in her low back across her belt-line.  I review health  and history form and 14 point review of systems.  Pain is a 7/10 on a  subjective scale today.  She continues to have slight decline in her  function and quality of life indices.  Her sleep is fair to poor.  I review  an old MRI lumbar spine report which is dated 11/25/93, which is normal.   PHYSICAL EXAMINATION:  VITAL SIGNS:  Blood pressure is 114/61, pulse 77,  respirations 16, O2 saturation 96% on room air.  BACK:  There is significant tenderness to palpation over the L5-S1 facet  joints bilaterally.   IMPRESSION:  1. Lumbar facet syndrome.  2. Low back pain secondary to facet arthropathy/facet syndrome.   PLAN:  1. Bilateral L5-S1 facet joint injections.  2. Continue current medications.  3. The patient is to return to clinic in two weeks for re-evaluation and     possible repeat facet joint injections as predicated upon the patient's     response and symptoms.   PROCEDURE:  Bilateral L5-S1 facet joint injections.   DESCRIPTION OF PROCEDURE:  The procedure was described to the patient in  detail, including risks, benefits, limitations, and alternatives.  The  patient wishes to proceed.  Informed consent was obtained.  The patient was  brought back to the fluoroscopy suite and placed on the table in prone  position.  Skin was prepped and  draped in usual sterile fashion.  Skin and  subcutaneous tissues were anesthetized with 3 cc of 1% lidocaine at two  independent needle access points.  Under direct fluoroscopic guidance, a 22  gauge 5 inch spinal needle was advanced into the inferior recesses of the L5-  S1 facet joints bilaterally.  Bony contact was made, and needle tip  placement was confirmed in multiple fluoroscopic views.  Further  confirmation was obtained after negative aspiration with the injection of  0.25 cc of Omnipaque 180 revealing bilateral L5-S1 facet arthrograms.  Each  facet joint was then injected with 0.5 cc of Kenalog, 40 mg/cc, plus 1 cc of  preservative-free 0.25% bupivacaine.  There were no complications.  The  patient tolerated the procedure well.  Discharge instructions were given.  The patient was instructed on the use of ice for rebound pain.  The patient  was released in stable condition.   The patient was educated on the above findings and recommendations and  understands.  There were no barriers to communication.                                               Sondra Come, D.O.    JJW/MEDQ  D:  08/25/2001  T:  08/29/2001  Job:  [id number]   cc:   Tanya D. Daphine Deutscher, M.D.

## 2010-06-07 NOTE — H&P (Signed)
NAME:  Rebecca [name], Rebecca [name]                           ACCOUNT NO.:  [id number]   MEDICAL RECORD NO.:  [id number]                   PATIENT TYPE:  OBV   LOCATION:  1832                                 FACILITY:  [facility]   PHYSICIAN:  Renato Battles, M.D.                  DATE OF BIRTH:  12/29/1960   DATE OF ADMISSION:  12/17/2002  DATE OF DISCHARGE:                                HISTORY & PHYSICAL   REASON FOR ADMISSION:  Chest pain.   HISTORY OF PRESENT ILLNESS:  The patient is a pleasant, 50 year old white  female with progressive left anterior chest pain below the left breast that  started around 4 p.m. while she was cooking.  The pain progressed over three  hours and became more severe with left radiation, nausea, shortness of  breath, dizziness, but no vomiting, no syncope, no sweating.  The pain was  continuous until the patient got to the emergency room.  She was given two  sublingual nitroglycerin around 7:30.  The patient reports having a similar  episode of chest pain two years ago at which time she came to the emergency  room.  However, she got fed up with the waiting, and did not get seen and  left for home.   REVIEW OF SYSTEMS:  CONSTITUTIONAL:  No fever, chills or night sweats.  No  weight changes.  CARDIOPULMONARY:  Positive for occasional cough, positive  for shortness of breath.  Positive for chest pain.  No orthopnea, no PND.  GASTROINTESTINAL:  Positive for nausea but no vomiting or diarrhea, no  constipation, no dysphagia.  GENITOURINARY:  No dysuria, hematuria or  urinary retention.   PAST MEDICAL HISTORY:  1. Type 2 diabetes.  2. Bipolar disorder.  3. Anxiety.  4. Hypercholesterolemia, just diagnosed.  5. Lower back pain.  6. Mitral valve prolapse.   PAST SURGICAL HISTORY:  1. Bilateral carpal tunnel surgery.  2. Hysterectomy in March 2004.   FAMILY HISTORY:  Negative.   SOCIAL HISTORY:  She smokes two packs a day for about 28 years.  No alcohol,  no drugs.   She is disabled secondary to lower-back pain.  She lives with her  son.  She takes care of her grandson on almost a daily basis.   ALLERGIES:  SULFA causes her to have swelling in her throat and shortness of  breath.   MEDICATIONS:  1. Zyprexa, unknown dose.  2. Klonopin 1 mg p.o. b.i.d.  3. Lithium 300 mg p.o. b.i.d.  4. Ambien unknown dose.  5. __________ unknown dose.  6. Lipitor 20 mg p.o. at bedtime.  This was started just three weeks ago.  7. Medication for low back pain, sounded like a narcotic.  The patient does     not know the exact name.   PHYSICAL EXAMINATION:  GENERAL:  She is alert and oriented x3, in no acute  distress.  VITAL  SIGNS:  Within normal limits.  HEENT:  The head is normocephalic, atraumatic.  Pupils are equal, round and  reactive and accommodation.  Extraocular motions are intact bilaterally.  NECK:  No lymphadenopathy, no thyromegaly, no JVD.  CHEST:  Clear to auscultation bilaterally, no wheezing, rales or rhonchi.  HEART:  Regular rate and rhythm, no murmur, gallop or rub.  ABDOMEN:  Soft, nontender, nondistended, normoactive bowel sounds.  EXTREMITIES:  No cyanosis, no clubbing.   LABORATORY DATA:  The first set of cardiac enzymes were negative.  The  emergency room i-STAT showed normal electrolytes, elevated hemoglobin at 16,  and hematocrit of 48.   EKG was negative even during this second episode of chest pain that the  patient had.   ASSESSMENT/PLAN:  1. Chest pain.  Risk factors include diabetes, heavy smoking, questionable     postmenopausal status given the fact that I do not know what kind of     hysterectomy she had, and the fact that she has irregular menses prior to     hysterectomy anyway.  Also included in the risk is elevated     hypercholesterolemia, just started treatment for it.  The patient is also     overweight.  Given the bipolar disorder, potential for drug abuse.  Her     history is very compelling for cardiac chest pain.   We are going to keep     the patient for 24-hour observation, during which time we are going to     check cardiac enzymes q.8 h x3, monitor EKG continuously and get a 12-     lead EKG in the morning.  I am going to check drug screen.  I elected not     to check cholesterol panel given the fact that she just started treatment     for hypercholesterolemia three days ago.  At this point, the patient     needs to follow up as scheduled prior to discharge.  2. Diabetes type 2.  The patient will continue on __________.  I am going to     check hemoglobin A1c.  She will be on an ADA diet with Regular q.6 h. or     q.a.c. and at bedtime, Accu-Cheks and sliding scale.  3. Tobacco addiction.  The patient will be on a nicotine patch and will not     be allowed to smoke.  4. Bipolar.  Continue home medications.  5. Low back pain.  She will be placed on p.r.n. dose of Oxycodone.                                                Renato Battles, M.D.    SA/MEDQ  D:  12/18/2002  T:  12/18/2002  Job:  [id number]

## 2010-06-07 NOTE — Discharge Summary (Signed)
   NAME:  Rebecca [name], Rebecca [name]                           ACCOUNT NO.:  [id number]   MEDICAL RECORD NO.:  [id number]                   PATIENT TYPE:  OBV   LOCATION:  9302                                 FACILITY:  [facility]   PHYSICIAN:  Ivor Costa. Farrel Gobble, M.D.              DATE OF BIRTH:  Mar 28, 1960   DATE OF ADMISSION:  03/24/2002  DATE OF DISCHARGE:  03/25/2002                                 DISCHARGE SUMMARY   DISCHARGE DIAGNOSIS:  Dysfunctional vaginal bleeding.   PROCEDURE:  Total vaginal hysterectomy.   HISTORY OF PRESENT ILLNESS:  A 50 year old gravida 4, para 4, contracepting  with a BTL.  She does have a history of bipolar disease who had increasing  complaints of irregularity of menses.  One year ago, she had delayed menses,  negative pregnancy test, and had used Provera.  Then she did cycle somewhat  regularly and has had irregular bleeding since then.  She is a smoker, not a  candidate for oral contraceptives.  She did have a normal ultrasound.  She  was admitted for definitive therapy, total vaginal hysterectomy.   HOSPITAL COURSE:  The patient was admitted for the [facility] by Dr. Farrel Gobble.  Assistant was Dr. Penni Homans.  Right ovary was conserved, and the left was  already surgically absent.  Postoperatively, she did fine, was afebrile.  Her postop H&H was 14.3 and 41.4, WBC was 13.5, platelets 249.  She was able  to void, was discharged on her first postop day.   DISPOSITION:  She was discharged home and was to follow up in the office in  two weeks.  She was discharged home on Tylox for pain as well as her other  numerous medications.  She was also on lithium, Zyprexa, Klonopin, Ambien  p.r.n., and Precose and was to follow up as needed.     Davonna Belling. Young, N.P.                      Ivor Costa. Farrel Gobble, M.D.    [place] Lanius  D:  04/27/2002  T:  04/27/2002  Job:  [id number]

## 2010-06-07 NOTE — Discharge Summary (Signed)
Rebecca [name], URISTA NO.:  [id number]   MEDICAL RECORD NO.:  [id number]          PATIENT TYPE:  IPS   LOCATION:  0504                          FACILITY:  [facility]   PHYSICIAN:  Geoffery Lyons, M.D.      DATE OF BIRTH:  06/07/1960   DATE OF ADMISSION:  04/16/2005  DATE OF DISCHARGE:  04/18/2005                                 DISCHARGE SUMMARY   CHIEF COMPLAINT AND PRESENT ILLNESS:  This was the second admission to [facility] for this 50 year old divorced white female.  History  of psychosis, paranoid ideation, suicidal ideation, felt someone was  following her.  States she immediately shut down.  Shakes, isolates, going  in a tailspin.   PAST PSYCHIATRIC HISTORY:  Was admitted to [facility] five  years prior to this admission.  Seeing Dr. Jules Schick twice.   ALCOHOL/DRUG HISTORY:  Denies active use of alcohol or any other drugs.   MEDICAL HISTORY:  Bursitis, chronic back pain.   MEDICATIONS:  DuoNeb as needed, albuterol 2 puffs every six hours as needed,  lithium 300 mg three times a day, Zyprexa 10 mg at night, Klonopin 1 mg four  times a day, Lunesta 10 mg at night, Lipitor 20 mg per day, Neurontin 600 mg  twice a day, naproxen 500 mg twice a day, metformin 500 mg twice a day,  Percocet 1 every six hours as needed.   PHYSICAL EXAMINATION:  Performed and failed to show any acute findings.   MENTAL STATUS EXAM:  Alert, cooperative female.  Casually dressed.  Speech  normal rate, tempo and production.  Mood anxious, depressed.  Thought  processes possibly underlying paranoia.  Cognition was well-preserved.   ADMISSION DIAGNOSES:  AXIS I:  Bipolar disorder, rule out psychotic  features.  AXIS II:  No diagnosis.  AXIS III:  Diabetes mellitus, chronic back pain, hypercholesterolemia.  AXIS IV:  Moderate.  AXIS V:  GAF upon admission 30; highest GAF in the last year 60.   HOSPITAL COURSE:  She was admitted.  She was started in  individual and group  psychotherapy.  She was maintained on her medications.  She endorsed having  had a hard time.  She got hurt.  Hurt her back while at work several months  prior to this admission.  She was placed under workman's comp.  Claims that  she was terminated.  Endorsed that she became very upset.  Claims that she  saw someone following her from workman's comp as they are trying to say that  she is not as disabled as she knows she is.  Claims her daughter and the  sister-in-law also have seen this situation.  Endorsed that she was not  paranoid but the therapist felt she was.  Used to see Rozanna Box but,  after he left the practice, went to mental health, did not stay with them  and then started seeing Dr. Raquel James.  She said that she was fine, that she  needed to be discharged, to resume her usual activities.  On March 30th, she  was in full contact with reality.  No suicidal or homicidal ideation.  No  hallucinations.  No delusions.  Willing to pursue further outpatient  treatment.  Upset with her situation.  Still thinks that workman's comp did  not want to be there for her.  As she was really stable and was not showing  any evidence of danger to self and others and she was able to have a plan  even if they were following her, not to react and use the proper channels to  address the situation, we went ahead and discharged to outpatient follow-up.   DISCHARGE DIAGNOSES:  AXIS I:  Bipolar disorder, rule out psychotic  features.  AXIS II:  No diagnosis.  AXIS III:  Diabetes mellitus, chronic back pain.  AXIS IV:  Moderate.  AXIS V:  GAF upon discharge 50-55.   DISCHARGE MEDICATIONS:  1.  Zyprexa 10 mg at night.  2.  Lithium 300 mg in the morning and 600 mg at bedtime.  3.  Allegra.  4.  Klonopin 1 mg four times a day as needed.  5.  Lipitor 20 mg per day.  6.  Naproxen 500 mg twice a day.  7.  Metformin 500 mg twice a day.  8.  Neurontin 600 mg twice a day.  9.   Patanol 0.1% ophthalmic solution.   FOLLOW UP:  Dr. Jamas Lav.      Geoffery Lyons, M.D.  Electronically Signed     IL/MEDQ  D:  05/05/2005  T:  05/06/2005  Job:  [id number]

## 2010-06-07 NOTE — Consult Note (Signed)
NAME:  Rebecca [name], Rebecca [name]                           ACCOUNT NO.:  [id number]   MEDICAL RECORD NO.:  [id number]                   PATIENT TYPE:  REC   LOCATION:  TPC                                  FACILITY:  [facility]   PHYSICIAN:  Sondra Come, D.O.                 DATE OF BIRTH:  1960/08/12   DATE OF CONSULTATION:  11/05/2001  DATE OF DISCHARGE:                                   CONSULTATION   HISTORY OF PRESENT ILLNESS:  The patient returns to the clinic today as  scheduled for reevaluation.  She was last seen on October 13, 2001.  She  continues to complain of low back pain but states that her primary symptoms  are now the shooting pains down her legs.  She has good and bad days.  She  has been using the Lidoderm patches which, she states, takes the edge off.  Her pain today is a 7-8/10 on a subjective scale.  Her function and quality  of life indices remain stable.  Her sleep is very poor despite taking Ambien  10 mg at bedtime which she has been taking for greater than three years per  her report.  I reviewed the health and history form and 14-point review of  systems.  She denies bowel and bladder dysfunction.   PHYSICAL EXAMINATION:  GENERAL:  Obese female in no acute distress.  VITAL SIGNS:  Blood pressure 115/71, pulse 94, respirations 18, O2  saturations 94% on room air.  EXTREMITIES:  Manual muscle testing is 5/5 bilateral lower extremities.  Sensory examination is intact to light touch bilateral lower extremities.  Muscle stretch reflexes are symmetric bilateral lower extremities.   IMPRESSION:  1. Chronic low back pain, myofascial and mechanical.  2. History of bipolar disorder.   PLAN:  1. Will increase Neurontin to 600 mg t.i.d. over a six-day period.  Will     have the patient increase by 300 mg every three days until she reaches     600 mg t.i.d., and I have provided her with sample pills to begin this as     well as a prescription for Neurontin 600 mg 1 p.o.  t.i.d., #90 with two     refills.  2. Discontinue Ambien and begin Pamelor 10 mg 1 p.o. q.h.s., #30 without     refills.  3. Will order a TENS unit.  4. Continue Ultracet as needed.  5. Patient encouraged to continue with home exercise program.  6. Patient to return to clinic in two months for reevaluation.   The patient was educated on the above findings and recommendations and  understands.  There were no barriers to communication.  Sondra Come, D.O.    JJW/MEDQ  D:  11/05/2001  T:  11/07/2001  Job:  [id number]   cc:   Tanya D. Daphine Deutscher, M.D.

## 2010-06-07 NOTE — Procedures (Signed)
NAMEEVEREST, HACKING NO.:  [id number]   MEDICAL RECORD NO.:  [id number]          PATIENT TYPE:  REC   LOCATION:  TPC                          FACILITY:  [facility]   PHYSICIAN:  Celene Kras, MD        DATE OF BIRTH:  1960/05/20   DATE OF PROCEDURE:  DATE OF DISCHARGE:                                 OPERATIVE REPORT   [name]. I evaluated and  reviewed her health and history form and 14-point review of systems. The  examiner discussed options with her.   1. I reviewed the FCE, and as I understand it, she was released from Dr.      Ethelene Hal, but she declares she is back in the workman's comp systems.  As      this is the case, I am going to hold off interventional approaches, or      progressive therapeutic approaches as she will be reintroduced to his      practice.  He was the treating pain physician, happy to health out any      way we can.  2. I relayed this to her.  Modifiable with features and health profile are      discussed such as cigarette cessation.  3. 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  I am not sure      based on her FCE that that is a realistic goal, but defer to Dr. Ethelene Hal      and her.  She will maintain contact with primary care.   I am also somewhat struck by the exam that there is some pain behavior, and  she is Waddell's positive, and therefore may be the repeat FCE may be  helpful.   Objectively, diffuse paracervical, suprascapular and paralumbar myofascial  discomfort is somewhat distractible, Waddell's positive 3/5.  Side bending  positive.  Intact neurologically.  I do not find anything new  neurologically.   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   PLAN:  Conservative management, transition back to Dr. Ethelene Hal' office or will  help anyway that he requests, and as  directed by Workers' Comp. She is  represented and apparently this was through the [employer]           ______________________________  Celene Kras, MD     HH/MEDQ  D:  09/23/2005 10:26:23  T:  09/23/2005 10:40:58  Job:  [id number]

## 2010-06-07 NOTE — H&P (Signed)
NAMEMARDEL, GRUDZIEN NO.:  [id number]   MEDICAL RECORD NO.:  [id number]                   PATIENT TYPE:   LOCATION:                                       FACILITY:   PHYSICIAN:  Ivor Costa. Farrel Gobble, M.D.              DATE OF BIRTH:   DATE OF ADMISSION:  03/24/2002  DATE OF DISCHARGE:                                HISTORY & PHYSICAL   CHIEF COMPLAINT:  Dysfunctional bleeding.   HISTORY OF PRESENT ILLNESS:  The patient is a 50 year old, G4, P4,  __________  with a tubal ligation, with a history of bipolar disease, who  complains of increasing irregularity of her menses.  Last year, she had a  long period of delayed menses and had negative pregnancy test and was given  Provera.  She then had a successful cycle in September but did not have one  for another three months but only had light bleeding.  The patient currently  smokes and is not a candidate for oral contraceptives.  Of note, in 2002,  she had a home positive pregnancy test but began her period shortly  thereafter.  After having a negative ultrasound, she did not ever followup  with an HSG to evaluate her tubes.  TSH and prolactin were done and were  normal.   PAST OBSTETRICAL/GYNECOLOGIC HISTORY:  1. Four vaginal deliveries without any complications.  2. Her menses are as above.  3. Her Pap smears are regular.  4. Contraception with a tubal ligation.   PAST MEDICAL HISTORY:  Bipolar disease.   PAST SURGICAL HISTORY:  1. She had repair of her elbow in 2003.  2. She had a tubal ligation, and she had a left LSO for a dermoid.   MEDICATIONS:  Inderal, Prilosec, amitriptyline, lithium, Zyprexa, Ambien,  Neurontin, clonazepam.   ALLERGIES:  SULFA.   SOCIAL HISTORY:  Significant for one pack-per-day tobacco use, four cups of  caffeine.  No exercise, no alcohol.   PHYSICAL EXAMINATION:  GENERAL:  She is an obese female in no acute  distress.  HEENT:  Unremarkable.  NECK:  Thyroid is  nontender and mobile.  HEART:  Regular rate.  LUNGS:  Clear to auscultation.  BREASTS:  Examined in the supine and upright position without masses,  discharge, or skin retractions.  ABDOMEN:  Soft and nontender.  GYN:  She has a parous introitus.  She has a mild cystocele, but vagina was  pink and moist.  The cervix is without lesions.  The uterus is small,  mobile.  The adnexa are negative.  RECTOVAGINAL:  Confirmatory.   LABORATORY DATA:  An ultrasound performed showed her uterus to be 8.8 x 4.3  x 5.7 cm.  The right ovary was unremarkable; the left was surgically absent.   ASSESSMENT:  Increasing irregular bleeding with dysfunctional bleeding.  Not  a candidate for oral contraceptives.  The patient declines nonsurgical  therapy for her irregular cycles.  We will therefore go ahead and do a total  vaginal hysterectomy.  Risks and benefits of the procedure were discussed,  and she will present in the morning of 03/24/02 for definitive therapy.                                               Ivor Costa. Farrel Gobble, M.D.    THL/MEDQ  D:  03/22/2002  T:  03/22/2002  Job:  [id number]

## 2010-06-07 NOTE — Consult Note (Signed)
NAME:  ALBIE, BAZIN NO.:  [id number]   MEDICAL RECORD NO.:  [id number]          PATIENT TYPE:  EMS   LOCATION:  ED                           FACILITY:  [facility]   PHYSICIAN:  Melvyn Novas, M.D.  DATE OF BIRTH:  07-19-1960   DATE OF CONSULTATION:  07/17/2004  DATE OF DISCHARGE:                                   CONSULTATION   This is a dictation of a code stroke called in by Dr. Adriana Simas at 12:20 p.m.   This is a 50 year old right-handed Caucasian female.  [name] is a patient  who has had multiple emergency room evaluations for chronic lower back pain,  abdominal pain, headaches, and dysesthesias in the past and has been  followed by the pain specialist, Dr. Andrey Campanile.  The patient presents today  with right-sided hemi-weakness that is ill-defined.  She states that it had  an acute onset at about 10:00 a.m., but she drove herself to [facility]  emergency room.  In the emergency room, her picture of weakness waxed and  waned.  For a couple of minutes, she presented completely flaccid with her  left arm instead of the right arm, then showed right-sided lower extremity  weakness but was able to transfer her body from the MRI table to the  hospital bed.  She also states that she cannot lift either eyebrow.  She  appears very anxious and has multiple times asked the research nurse  tearfully about what is going on with her.  She seems to perceive a deficit  that we cannot objectify here today.   Patient has a history of hysterectomy, bipolar disease, anxiety disorder,  chronic lower back pain.   The medication list is not attached.  The patient only knows that she is  SULFA-ALLERGIC.   PHYSICAL EXAMINATION:  VITAL SIGNS:  Her blood pressure has been 135/80.  Her heart rate 82.  Her respiratory rate is 16.  LUNGS:  Clear to auscultation.  NECK:  She has no carotid bruit.  EXTREMITIES:  No peripheral clubbing, cyanosis or edema.  All peripheral  pulses are  palpable.  NEUROLOGIC:  Patient is atraumatic and normocephalic.  No tongue bite.  No  evidence of goiter.  On mental status exam, the patient has a peculiar  affect and appears very anxious and almost tearful initially, then is very  passive and uninvolved.  She has no sign of dysarthria, aphasia, or apraxia.  Cranial nerves examination:  The patient has a normal cranial nerve  examination for visual fields, extraocular movements, pupillary activity,  facial asymmetry, tongue and uvular movement.  She has normal range of  motion of the neck.  She refuses to lift either eyebrow when asked to  perform this maneuver, but during our conversation, she shows full facial  movement bilaterally and symmetrically.  The patient further has on her  motor examination equal deep tendon reflexes and downgoing toes bilaterally.  She states that her right leg now is flaccid as I test her, but when I lift  her leg, she is able to keep the leg  extended at the knee while I lift only  her thigh.  When I lift her leg at the ankle level, she will drop it to the  bed immediately.  I asked to try to lift against resistance, and she  actually provides strength and pushes her leg into the mattress instead of  lifting it up.  Although she has good grip strength bilaterally, she states  that she cannot lift her arm and cannot perform a finger-nose test.  She  could do the finger-nose test with an elbow leaned on.  When the research  nurse tried to have her extremity drop, she could divert it from her face to  the side of the bed and actually arrest in the middle of the motion.  Sensory:  The patient states that she cannot feel either lower extremities  and either arm, but she does have response to noxious stimuli.  Gait and  station were deferred.   CONCLUSION:  This is an examination that does not provide focal neurological  deficits that would be conclusive for a focal true central nervous system  lesion.  The  findings are not conclusive for an organic deficit.  The  patient will go on for a diffusion-weighted image MRI.   ADDENDUM:  The patient's MRI showed no diffusion-weighted image abnormal  lesion.  The patient also had normal MRI with and without contrast on MRA.  Again, her vital signs are stable, and the neurology stroke team will sign  off and recommend an evaluation by psychiatry.       CD/MEDQ  D:  07/17/2004  T:  07/17/2004  Job:  [id number]   cc:   Donnetta Hutching, MD   Zachary George, DO

## 2010-06-07 NOTE — Discharge Summary (Signed)
Rebecca, [name] NO.:  [id number]   MEDICAL RECORD NO.:  [id number]          PATIENT TYPE:  IPS   LOCATION:  0401                          FACILITY:  [facility]   PHYSICIAN:  Anselm Jungling, MD  DATE OF BIRTH:  1960-11-27   DATE OF ADMISSION:  07/08/2007  DATE OF DISCHARGE:  07/12/2007                               DISCHARGE SUMMARY   IDENTIFYING DATA AND REASON FOR REFERRAL:  The patient is a 50 year old  Caucasian female admitted on an involuntary petition.  She came to [name]  with a history of severe mental disorder and had been off her  psychotropic medications which had included Haldol and Depakote.  She  had been increasingly aggressive towards family members.  She was  initially treated in the medical hospital and then transferred to [name]  once medically stable.  She was given an initial Axis I diagnosis of  bipolar disorder NOS.  Please refer to the admission note for further  details pertaining to the symptoms, circumstances and history that led  to her hospitalization.   MEDICAL AND LABORATORY:  The patient came to [name] with a history of  insulin nondependent diabetes mellitus.  She was continued on her usual  Glucophage 500 mg daily.  In addition, insulin was ordered, available in  the event of need for coverage.  Neurontin 600 mg four times daily,  Flexeril 10 mg q.h.s., and Lyrica 75 mg t.i.d. for fibromyalgia and pain  symptoms was continued.  The patient was also continued on Protonix 40  mg daily as a substitute for her usual Prilosec 20 mg daily.  There were  no acute medical issues.   HOSPITAL COURSE:  The patient was admitted to the adult inpatient  psychiatric service.  She presented as a moderately obese, but normally-  developed adult female who was alert, and fully oriented.  Her thoughts  initially were quite disorganized, and she appeared to have minimal  insight.  She was restarted on her usual medications as described above.  Over the  course of her hospital stay, she appeared to be better  organized, and in a consistently pleasant mood.  She reported that she  was feeling better.  She indicated that her adult daughter lives nearby,  and she was open to her daughter being involved in her aftercare  planning.  The social worker was able to contact the patient's daughter  over the telephone and coordinate discharge plans.   AFTERCARE:  The patient was to follow up with her usual physician, Dr.  Betti Cruz, a psychiatrist, on July 21, 2007.  She was also to see Cleotis Nipper for individual psychotherapy on July 15, 2007.   DISCHARGE MEDICATIONS:  1. Glucophage 500 mg daily.  2. Neurontin 600 mg q.i.d.  3. Lipitor 20 mg daily.  4. Flexeril 10 mg nightly.  5. Prilosec 20 mg daily.  6. Lyrica 75 mg t.i.d.   DISCHARGE DIAGNOSES:  AXIS I:  Major depressive disorder, recurrent with  psychotic features, resolving.  AXIS II:  Deferred.  AXIS III:  1. Non-insulin-dependent diabetes mellitus.  2. Fibromyalgia.  3. Gastroesophageal reflux disease.  4. Hyperlipidemia.  AXIS IV:  Stressors severe.  AXIS V:  Global Assessment of Functioning on discharge 55.      Anselm Jungling, MD  Electronically Signed     SPB/MEDQ  D:  07/13/2007  T:  07/13/2007  Job:  [id number]

## 2010-06-07 NOTE — Consult Note (Signed)
NAME:  Rebecca [name], Rebecca [name] NO.:  [id number]   MEDICAL RECORD NO.:  [id number]                    PATIENT TYPE:   LOCATION:                                       FACILITY:   PHYSICIAN:  Sondra Come, D.O.                 DATE OF BIRTH:   DATE OF CONSULTATION:  09/08/2001  DATE OF DISCHARGE:                  PHYSICAL MEDICINE & REHABILITATION CONSULTATION   HISTORY OF PRESENT ILLNESS:  The patient returns to the clinic today for  repeat bilateral L5-S1 facet joint injections.  She was last seen on September 03, 2001.  She states that yesterday she had a really bad day and none of  the medications seem to help her. Today seems to be somewhat better.  She  rates her pain as an 8/10 on a subjective scale.  No new neurologic  complaints. I reviewed health and history form and 14-point review of  systems.   PHYSICAL EXAMINATION:  GENERAL APPEARANCE:  A healthy female in no acute  distress.  VITAL SIGNS:  Blood pressure 109/68, pulse 84, respiratory rate 14, O2  saturation 98% on room air.  NEUROLOGIC:  No new neurologic findings in the lower extremities including  motor, sensory and reflexes.   IMPRESSION:  1. Lumbar facet syndrome.  2. Chronic low back pain.   PLAN:  1. Repeat bilateral L5-S1 facet joint injections as the patient has had     nearly complete relief of her symptoms for four to five days following     previous injection.  Repeat injections may give a longer lasting effect.  2. Continue Percocet 10 mg/650 mg just as needed for severe pain.  The     patient still has some remaining on previous prescription.  3. The patient only gets temporary relief with injections.  Would consider     diagnostic medial branch blocks and possible consideration for     radiofrequency lesioning of the medial branches to the L5-S1 facet     joints.  4. The patient to return to clinic in two weeks for reevaluation.   PROCEDURE:  Bilateral L5-S1 facet joint  injections.  The procedure again was  described to the patient including risks, benefits, limitations and  alternatives.  The patient wishes to proceed.  Informed consent was  obtained.  The patient was brought back to the fluoroscopy suite and placed  on the table in prone position.  Skin was prepped and draped in usual  sterile fashion.  Skin and subcutaneous tissues were anesthetized with 3 cc  of 1% lidocaine at two independent needle access points.  Under direct  fluoroscopic guidance a 22-gauge 5 inch spinal needle was advanced into the  inferior recesses of the L5-S1 facet joints bilaterally.  Bony contact was  made and needle tip placement was confirmed under multiple fluoroscopic  views.  Further confirmation was obtained after negative aspiration with the  injection of 0.25 cc  of Omnipaque revealing bilateral L5-S1 facet  arthrograms.  Each facet joint was then injected with 0.5 cc of Kenalog 40  mg/cc plus 1 cc of 1% lidocaine.  There were no complications.  The patient  tolerated the procedure well.  Discharge instructions given.  The patient  was released in stable condition.  Post injection pain score is 0 to 1/10.   The patient was educated on the above findings and recommendations and  understands.  No barriers to communication.                                                Sondra Come, D.O.    JJW/MEDQ  D:  09/08/2001  T:  09/08/2001  Job:  [id number]   cc:   Tanya D. Daphine Deutscher, M.D.
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
## 2010-06-07 NOTE — Assessment & Plan Note (Signed)
Rebecca [name] is back in for a recheck and refill of her medications.  She  is a 50 year old white female.  She has a long history of low back pain  dating back to the 1980s and was last seen by me on February 13.   CHIEF COMPLAINT:  Low back pain.  She also has some lower extremity  pain, predominantly below the knees into the feet, which she describes  as not an everyday problem.  The pain below her knees and into her feet  is described as being quality of some numbness and tingling.  Her low  back pain is 75% of her problem.  The worst area is between the low back  and into the buttock region bilaterally.  Pain is typically worse with  walking, sitting, standing and improves with rest, heat, medication and  injections.   She is getting fair relief with current meds that she is on per a self  report today.   Medications prescribed by this clinic over the last month include:  1. MS Contin 15 mg 1 p.o. b.i.d.  2. Mobic 7.5 mg 1 p.o. b.i.d.  3. Vicodin 7.5/750 mg 2 p.o. b.i.d. initially for the first 14 days of      last month and then down to a b.i.d., t.i.d. dosage schedule.  4. Robaxin 750 mg up to 3 times a day and she is also on Prilosec and      75 mg of Lyrica every 12 hours.   She reports that she is able to walk between 5 and 10 minutes at a time.  She is able to climb stairs and drive.   She reports that she requires some assistance with dressing, bathing,  toileting, meal prep, household duties and shopping.  She is independent  with her feeding.  She admits to some confusion, depression and anxiety,  denies suicidal ideation.  She reports overall weight gain over the last  several years.   She is divorced, lives with her son and smokes about a pack and a half  of cigarettes a day.   No other changes noted in past, medical, social or family history since  our last visit.   EXAMINATION:  Her blood pressure is 96/50 standing, 84/66 sitting, pulse  69, respirations 16, 98%  saturated on room air. With regard to her low  blood pressure we did request she discuss this with her primary care  physician.  She was not symptomatic sitting or standing today in clinic.   She is an obese white female who appears her stated age.   She is oriented x3.  Her speech is clear, her affect is bright, alert.  She is cooperative and pleasant and she follows commands without any  difficulty.  Her gait in the room was essentially normal.  She did not  display any antalgia.  She has limitations in lumbar motion in all  planes.  Her reflexes are 2+ at the patellar tendons, slightly decreased  at the Achilles tendons bilaterally. No abnormal tone is noted.  Her  motor strength is good in the lower extremities without any focal  weakness.  Sensory exam is intact as well in the lower extremities.   She has multiple areas of tenderness throughout her parascapular region  and anterior chest wall, medial elbow, medial knees over the trochanters  and multiple areas throughout her cervical, thoracic and lumbar  paraspinal musculature.   She is rather exquisitely tender over her bilateral trochanters as well.  Again, reviewing recent studies February 12, 2005 normal EMG nerve  conduction studies of the lower extremities. January 22, 2005 MRI  revealed no disk herniation, no spinal stenosis, no nerve root  encroachment, mild disk __________  at multiple levels.   IMPRESSION:  1. Mild degenerative disk disease, T12, L1, L1 L2, L3, L4 per MRI      January 22, 2005.  2. Diabetes mellitus x10 years.  3. History of right knee early degenerative joint disease.  4. Multiple tender points consistent with fibromyalgia overlay.  5. History of bipolar disorder.  6. Nicotine addiction.  7. History of mitral valve prolapse.   PLAN:  We will refill the following medications for her: Robaxin 750 mg  1 p.o. t.i.d. p.r.n. back spasm # 30, two refills; Vicodin 7.5/750 mg 1  p.o. b.i.d. to t.i.d. #  80, no refills; MS Contin 15 mg 1 p.o. t.i.d.  p.r.n. back pain # 90; Lyrica 75 mg 1 p.o. t.i.d. # 90, one refill and  Mobic 7.5 mg 1 p.o. b.i.d. # 60, no refills.   A half hour amount of time was spent discussing a treatment plan with  Rebecca [name].  I would like her to slowly increasing her overall activity  level.  We discussed multiple options including a formal physical  therapy program versus a pool program versus an independent walking  program.  At this point she feels she would like to trial increasing her  activity slowly beginning with a walking program of about 5 minutes  twice a day.  She will keep a log of her activity level.   During her activities at home she will wear her lumbar support, which  was provided by Dr. Ethelene Hal.  She finds it to be somewhat bulky and  cumbersome.  May consider getting her a little profile lumbar support  that is flexible for her.  She will also begin some vocational  evaluations within in the next month or so as well.  We will see her  back in a month.  She has been stable on pain medications provided.  We  have increased her MS Contin slightly and have also increased her Lyrica  slightly today.           ______________________________  Brantley Stage, M.D.     DMK/MedQ  D:  04/01/2006 16:25:09  T:  04/03/2006 10:04:52  Job #:  [id number]

## 2010-06-07 NOTE — H&P (Signed)
NAMEAVICE, FUNCHESS NO.:  [id number]   MEDICAL RECORD NO.:  [id number]          PATIENT TYPE:  MAT   LOCATION:  [facility]                          FACILITY:  [facility]   PHYSICIAN:  Rudy Jew. Ashley Royalty, M.D.DATE OF BIRTH:  Feb 18, 1960   DATE OF ADMISSION:  05/11/2004  DATE OF DISCHARGE:                                HISTORY & PHYSICAL   HISTORY OF PRESENT ILLNESS:  This is a 50 year old gravida 4 para 4 with  chief complaint pelvic pain. The patient complains of right lower quadrant  pain since May 10, 2004 at approximately 3 a.m. It did not begin around  her umbilicus. She describes it as sharp and exacerbating and remitting.  Yesterday she had some nausea without vomiting, none today. She denies any  subjective fever though she has not taken her temperature with a  thermometer. She denies any history of STDs; any history of exposures;  recent vacation, trip, or visits to eating establishments. At [facility]  emergency department yesterday she had CT of the abdomen and pelvis ordered.  CT of the abdomen revealed no acute abnormality. CT of the pelvis revealed a  2.6 cm structure in the right adnexa consistent with a cyst. The patient is  status post vaginal hysterectomy and left oophorectomy in separate settings.   MEDICATIONS:  Klonopin, Flexeril, Lithium, Ambien, trazodone, Neurontin,  Metformin, Methadone t.i.d.   PAST MEDICAL HISTORY:  Medical:  Bipolar, mitral valve prolapse,  nephrolithiasis, adult-onset diabetes non-insulin-dependent, chronic low-  back pain with no specific diagnosis known to the patient. Receives  Methadone from the pain management rehabilitation center in [facility].  Surgical history:  Total vaginal hysterectomy and left oophorectomy for a  benign cyst in separate settings as mentioned above. Bilateral tubal  sterilization procedure. Orthopedic surgery on her elbows bilaterally.   ALLERGIES:  SULFA.   FAMILY HISTORY:  Noncontributory.   SOCIAL HISTORY:  The patient has a 50 pack-year smoking history. She denies  the use of alcohol or drugs.   REVIEW OF SYSTEMS:  Noncontributory.   PHYSICAL EXAMINATION:  GENERAL:  Well-developed, well-nourished, pleasant  white female in no acute distress.  VITAL SIGNS:  Temperature 98 degrees, pulse 74, respirations 16, blood  pressure 107/72.  SKIN:  Warm and dry without lesions.  LYMPH:  There is no supraclavicular, cervical, or inguinal adenopathy.  HEENT:  Normocephalic.  NECK:  Supple without thyromegaly.  CHEST:  Lungs are clear.  CARDIAC:  Regular rate and rhythm.  BREAST:  Exam deferred.  ABDOMEN:  Soft and minimally tender to direct palpation. There is no rebound  tenderness whatsoever. Bowel sounds are active.  MUSCULOSKELETAL:  Reveals full range of motion without edema, cyanosis, or  CVA tenderness.  PELVIC:  External genitalia reveals an ulcer on the left labia majus. Herpes  culture was obtained. Speculum examination reveals normal vagina. The  vaginal cuff is well healed. Bimanual examination reveals surgical absence  of the uterus. I can appreciate no adnexal masses.   IMPRESSION:  1.  Vulvar ulcer - rule out herpes.  2.  Status post vaginal hysterectomy and  left oophorectomy in separate      settings.  3.  Right ovarian cyst.  4.  Pelvic pain - possibly secondary to #3.  5.  History of tobacco abuse.  6.  Bipolar disorder.  7.  Mitral valve prolapse.  8.  Nephrolithiasis.  9.  Adult-onset diabetes.  10. Chronic low-back pain.   PLAN:  Obtained urinalysis and CBC, both of which are negative. Will provide  analgesia in the form of Percocet 5/325 #25 one to two p.o. q.4-6h. p.r.n.  pain. The patient is instructed to see Dr. Farrel Gobble on May 13, 2004 and  states she already has an appointment with Dr. Farrel Gobble on that day. She is  to call sooner should her symptoms worsen or fail to be controlled with the  analgesic medication.      JAM/MEDQ  D:   05/11/2004  T:  05/11/2004  Job:  [id number]

## 2010-06-07 NOTE — Consult Note (Signed)
[name], Rebecca [name] NO.:  [id number]   MEDICAL RECORD NO.:  [id number]                   PATIENT TYPE:   LOCATION:                                       FACILITY:   PHYSICIAN:  Gustavus Messing. Orlin Hilding, M.D.          DATE OF BIRTH:   DATE OF CONSULTATION:  DATE OF DISCHARGE:                                   CONSULTATION   CHIEF COMPLAINT:  Pain in back and legs.   HISTORY OF PRESENT ILLNESS:  [name] is a 50 year old white woman with a  vaginal hysterectomy on Thursday with an epidural anesthesia. She presented  to [facility] emergency room with complaint of pain radiating down her  legs, up her shoulders, back of her head, and around her waist. She also  complains of urinary frequency of large amounts. There is no weakness,  fever, or numbness.   REVIEW OF SYSTEMS:  Chronic low back pain. She is moving her bowels. No  dysuria, numbness, or weakness noted.   PAST MEDICAL HISTORY:  Significant for chronic low back pain, bipolar  disorder, mitral valve prolapse, obesity, remote tubal ligation, recent  vaginal hysterectomy.   MEDICATIONS:  Include Lithium, Zyprexa, Tylox, Clonopin, Inderal, Prilosec,  Ambien, Amitriptyline and Neurontin.   ALLERGIES:  SULFA DRUGS.   SOCIAL HISTORY:  She is a smoker. No alcohol. She does use caffeine.   FAMILY HISTORY:  Mother is alive and well. Father is deceased of some kind  of drug interaction. He took medications with alcohol.   PHYSICAL EXAMINATION:  VITAL SIGNS: Temperature 97.6, pulse 79, respiratory  rate 20, blood pressure 99/57.  GENERAL: An obese white female in no acute distress. While waiting, she  frequently went out to smoke.  HEENT: Normocephalic, atraumatic. Pupils are equal, round, and reactive to  light and accommodation. Extraocular muscles intact without nystagmus and  ptosis.  NECK: Supple without bruits.  HEART: Regular rate and rhythm.  EXTREMITIES: There is 1+ edema in  lower extremities.  NEURO: Mental status, she is alert and awake. Normal language. Cranial  nerves 2-12 are intact. Visual fields are full to confrontation. Sensation  is normal. Facial motor activity is intact. Hearing is intact. Palate  symmetric and midline. She has normal shoulder shrug. Motor examination, she  has normal station and gait. She can demonstrate normal bulk, tone, and  strength throughout upper and lower extremities. Reflexes are 2+ in the  biceps and brachial radialis. I could not elicit the triceps. Knee jerks and  ankles are present. She had downgoing toes. Stimulation, coordination,  finger-to-nose, heel-to-chin are normal. Sensory examination is normal  without a level. She is tender to superficial palpation to just the  slightest touch. I did not even try deep palpation. Ecchymosis noted over  the epidural site.   IMPRESSION:  Back pain radiating down legs and across abdomen and urinary  frequency, siting a recent epidural. However, she  has a normal neurologic  examination with normal strength, reflexes, and sensation. She is afebrile.  No nuchal rigidity. Nothing to suggest a cord lesion such as an epidural  abscess or hematoma or meningitis. It is unclear how the urinary urgency  fits into the picture.   RECOMMENDATIONS:  I do not think that she needs admission. She should get an  MRI scan of the lower thoracic and upper lumbar spine to be sure no cord or  clonus lesion has been missed, but this could be done prior to release from  the emergency room or as an outpatient early next week. Would just use pain  control, although she may need to return to whoever is managing her chronic  pain syndrome eventually, if this does not clear up quickly.                                               Catherine A. Orlin Hilding, M.D.    CAW/MEDQ  D:  03/26/2002  T:  03/27/2002  Job:  [id number]

## 2010-06-07 NOTE — Consult Note (Signed)
NAME:  Rebecca [name], FAYSON NO.:   MEDICAL RECORD NO.:  [id number]                    PATIENT TYPE:   LOCATION:                                       FACILITY:   PHYSICIAN:  Sondra Come, D.O.                 DATE OF BIRTH:   DATE OF CONSULTATION:  10/13/2001  DATE OF DISCHARGE:                                   CONSULTATION   HISTORY OF PRESENT ILLNESS:  Rebecca [name] returns to the clinic today as  scheduled for re-evaluation. She was last seen on September 22, 2001. She  continues to complain of low back pain, rating it an eight over ten on a  subjective scale. She has undergone bilateral L5-S1 facet joint injections  times two, which gave her some moderate improvement transiently. At last  visit, she was noted to have bilateral lower extremity radicular symptoms  and I repeated an MRI of the lumbar spine, which was negative for disk  herniation or spinal stenosis. There also was no neural foraminal stenosis  noted. I started her on Neurontin and she is up to 900 mg per day, which she  states is helping her lower extremity radicular symptoms. I reviewed health  and history form on a fourteen point review of systems. Currently, she  denies radicular pain, numbness, or paresthesias. Denies bowel or bladder  dysfunction. She continues taking Neurontin 900 mg per day, Ibuprofen 800 mg  as needed, and has also tried Ultracet one or two, up to three times per  day, with minimal improvement. I reviewed the MRI report with Rebecca [name].   PHYSICAL EXAMINATION:  GENERAL: An obese female in no acute distress.  VITAL SIGNS: Blood pressure 129/70, pulse 91, respiratory rate 16, O2 sat  99% on room air.  BACK: Examination of the back reveals increased lumbar lordosis with  tenderness to palpation bilateral lumbar paraspinal muscles. No significant  spasm noted at this time. No new neurologic findings of the lower  extremities, including motor, sensory, and reflexes. The  patient is noted to  have tight hamstrings and hip flexors bilaterally.   IMPRESSION:  1. Chronic low back pain, mild fascial and mechanical.  2. History of bipolar disorder.   PLAN:  1. I had a long discussion with Rebecca [name] regarding further treatment     options. This was an extensive consultation of 25 minutes duration. We     discussed beginning a home exercise program to include a walking program     as well as a stretching program and I demonstrated hamstring and hip     flexor stretching. I would eventually like to add in pelvic tilt and     quadricep exercises. We discussed improving her health characteristics     including weight loss and smoking cessation, which I believe will help     her in the long run. Will prescribe Lidoderm  patches to apply to her     lumbar region, up to 12 hours per day, #60 with one refill. I also     provided her with two sample patches.  2. Continue current medications including Neurontin, Ultracet, and     Ibuprofen.  3. I have asked the patient to bring her MRI with her at next visit.  4. The patient to return to the clinic in one month for re-evaluation.   The patient was educated about the above findings and recommendations and  understands. There are no barriers to communication.                                               Sondra Come, D.O.    JJW/MEDQ  D:  10/13/2001  T:  10/13/2001  Job:  [id number]   cc:   Tanya D. Daphine Deutscher, M.D.

## 2010-06-07 NOTE — Consult Note (Signed)
NAME:  Rebecca [name], Rebecca [name]                           ACCOUNT NO.:  [id number]   MEDICAL RECORD NO.:  [id number]                   PATIENT TYPE:  REC   LOCATION:  [facility]                                  FACILITY:  [facility]   PHYSICIAN:  Zachary George, DO                      DATE OF BIRTH:  July 10, 1960   DATE OF CONSULTATION:  01/05/2002  DATE OF DISCHARGE:                                   CONSULTATION   REASON FOR CONSULTATION:  The patient returns to the clinic today for re-  evaluation.  She was last seen on November 05, 2001.  She has had a TENS unit  trial in the interim without any significant relief.  She continues to  complain of low back pain stating that she cannot stand or sit for any  prolonged period of time.  She denies any lower extremity radicular pain at  this time.  Her pain is an 8:10 on a subjective scale.  She states she  started back at the gym and her symptoms got worse after using a treadmill  and bicycle.  She continues on Neurontin 600 mg t.i.d. and asks about  possibly weaning off of this medication.  She is not performing any lumbar  stabilization exercises and continues to smoke.  Her function and quality of  life, however, seems to be improved to some degree.  I reviewed health and  history form and 14 point review of systems.  The patient has been off work  for several months and we discussed this at length.   PHYSICAL EXAMINATION:  GENERAL:  Examination reveals an obese female in no  acute distress.  VITAL SIGNS: Blood pressure 122/78, pulse 120, respirations 20, oxygen  saturation 97% on room air.  MUSCULOSKELETAL:  Examination of the back reveals a level pelvis without  scoliosis.  There is tenderness to palpation in the lumbar paraspinal  muscles diffusely.  There is also some tight ropey texture to her paraspinal  muscles without any spasm.  Range of motion of the lumbar spine is limited  in all planes secondary to guarding and pulling sensation in the  paraspinous  muscles.  Manual muscle testing is 5/5 bilateral lower extremities.  Sensory  examination is intact to light touch bilateral lower extremities at this  time.  Muscle stretch reflexes are symmetric bilateral lower extremities.  Straight leg raise is negative bilaterally but with significantly tight  hamstrings.  Legrand Rams is negative bilaterally but with significantly tight hip  flexors.   IMPRESSION:  1. Chronic low back pain, myofascial, mechanical.  2. History of bipolar disorder.   PLAN:  1. I discussed further treatment with the patient.  At this point I     recommend pursuing a healthier lifestyle with smoking cessation and     continued aerobic conditioning program.  I recommend trying the  elliptical jogger at her gym.  I also recommend resuming her lumbar     stabilization exercise program.  2. Will increase her Pamelor to 20 mg at bedtime as she has complained of     difficulty sleeping.  3. Will have her decrease Neurontin to 600 mg b.i.d. X1 week then daily X1     week and then off and monitor for symptoms.  4. In terms of the patient's work status, I do not feel like she is a long     term disability candidate, in fact, I believe she should resume work at     this point.  She does not currently have a job to go to but she is     certainly not disabled at this point and needs to be reintroduced into     the work force.  I think initially she may need some work restrictions     and we will make these when she gains employment.  I recommend a job     where she can change positions frequently and does not have to sit or     stand for prolonged periods of time.  Also recommend a job where she is     not bending and lifting heavy objects.  5. Patient is to return to the clinic in three months for re-evaluation.   Patient was educated regarding the above findings and recommendations and  understands.  There were no barriers to communication.                                                Zachary George, DO    JW/MEDQ  D:  01/05/2002  T:  01/06/2002  Job:  [id number]   cc:   Tanya D. Daphine Deutscher, M.D.  [phone] [address], [address]  [place]  [place] [address]  Fax: [phone]

## 2010-06-07 NOTE — Consult Note (Signed)
NAME:  Rebecca [name], Rebecca [name]                           ACCOUNT NO.:  [id number]   MEDICAL RECORD NO.:  [id number]                   PATIENT TYPE:  INP   LOCATION:  2010                                 FACILITY:  [facility]   PHYSICIAN:  Sondra Come, D.O.                 DATE OF BIRTH:  02-07-1960   DATE OF CONSULTATION:  08/19/2001  DATE OF DISCHARGE:  08/23/2001                  PHYSICAL MEDICINE & REHABILITATION CONSULTATION   Dear Dr. Daphine Deutscher:   Thank you very much for kindly referring [name].  Rebecca [name] was seen in our  clinic today.  Please refer to the following for details regarding the  history, physical examination, and treatment plan.  Once again, thank you  for allowing [name] to participate in the care of Rebecca [name].   CHIEF COMPLAINT:  Chronic low back pain and lower extremity pain.   HISTORY OF PRESENT ILLNESS:  Rebecca [name] is a pleasant 50 year old right hand  dominant female with a long history of recurrent low back pain who states  that she most recently hurt her back approximately one and a half years ago  at work.  She states she was holding a computer monitor, leaning over a  desk, and pulled something in her back and it just has not healed up.  She  has treated this with trial of physical therapy on four different occasions  which made her symptoms worse.  She also has been involved in aquatic  therapy which also aggravated her symptoms.  She states that she has been  trying to walk on a treadmill at the gym which has increased her pain.  She  complains mainly of low back pain across the belt line region radiating into  the bilateral lower extremities down the center which feels achy and at  times throbbing.  Her pain is an 8/10 on a subjective scale today and  described as constant, achy, throbbing, sharp with associated burning,  stabbing, numbness, tingling, and weakness sensations intermittently.  Function and quality of life indexes have declined.  Her sleep is poor.  She  was originally treated for this problem by Dr. Tiburcio Pea, but changed  physicians to Dr. Daphine Deutscher.  Dr. Tiburcio Pea had been treating her with Percocet  and occasional Demerol shots when the pain got severe enough.  She was also  getting Toradol shots on occasion.  Recently, she has been receiving  Percocet from Dr. Daphine Deutscher to control her pain.  In the past she has been on  OxyContin which helped her pain but she states she wanted to be off of  OxyContin and this was changed to Percocet.  She has also tried Demerol with  some improvement.  She has been on Ultram and Ultracet without improvement.  She has been on Flexeril without improvement, ibuprofen, Naprosyn, Vioxx  without improvement.  At one point she was on Neurontin which  she states  helped to some degree.  I reviewed health and history form and 14 point  review of systems.  The patient's pain is worse with bending, sitting,  working, and therapy and improved with rest, ice, and medications.  She  denies bowel and bladder dysfunction.  Denies fever, chills, night sweats,  weight loss, or severe night pain.  She had an MRI of her lumbar spine at  [facility] and states that she has some degenerative disk changes, but  cannot remember whether or not she had facet arthropathy.   PAST MEDICAL HISTORY:  Bipolar disorder, panic attacks, mitral valve  prolapse, hypoglycemia.   PAST SURGICAL HISTORY:  Left ulnar nerve transposition, ovary resection  secondary to cyst, and tubal ligation.   FAMILY HISTORY:  Diabetes.   SOCIAL HISTORY:  The patient smokes one and a half packs of cigarettes per  day and I counseled her on the importance of smoking cessation in terms of  low back pain and overall health.  The patient admits to occasional alcohol  use and denies illicit drug use.  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  She enjoys her job.   ALLERGIES:  SULFA.   MEDICATIONS:  Ambien, lithium, Trilafon, Zyprexa, Klonopin, Precose,  Inderal, Percocet 5/325 mg two to three q.d.  The patient states she just  ran out.   PHYSICAL EXAMINATION:  GENERAL:  Healthy female in no acute distress.  VITAL SIGNS:  Blood pressure 111/62, pulse 81, respirations 16, O2  saturation 96% on room air.  BACK:  Examination of the back reveals level pelvis without scoliosis.  There is mildly increased lumbar lordosis.  Range of motion of the lumbar  spine reveals full flexion without discomfort.  The patient has full  extension, but with significant discomfort bilateral lumbosacral region.  There is also increased pain with rotation and extension bilaterally.  Palpatory examination reveals tenderness to palpation bilateral lumbosacral  paraspinal muscles.  NEUROLOGIC:  Manual muscle testing is 5/5 bilateral lower extremities.  Sensory examination is intact to light touch bilateral lower extremities at  this time.  Muscle stretch reflexes are 2+/4 bilateral patellar, 1+/4  bilateral medial hamstrings, and 2+/4 bilateral Achilles.  Straight leg  raise is negative bilaterally, but with tight hamstrings right greater than  left.  FABER is negative bilaterally, but with tight hip flexors right  greater than left.  EXTREMITIES:  No heat, erythema, or edema in the lower extremities.   IMPRESSION:  1. Chronic low back pain.  History and physical examination are consistent     with posterior element pain, i.e., facet joint arthropathy.  2. Degenerative disk disease of the lumbar spine by history.  3. Poor lower extremity flexibility likely contributing to patient's low     back pain.  4. History of bipolar disorder.   PLAN:  1. Had a long discussion with Rebecca [name] regarding treatment options    including medications and interventional approach.  Would like to get the     report  of patient's MRI for review and enhanced database.  2. It is reasonable to proceed with diagnostic/therapeutic bilateral lumbar     facet joint injections given patient's history and presentation     consistent with facet arthropathy.  I described the procedure to Ms.     [name] in detail including the risks, benefits, limitations, and  alternatives.  She wishes to proceed in this fashion.  3. If patient is not getting any relief with lumbar facet injections, would     consider lumbar epidural steroid injections.  4. In terms of medications, will continue with Percocet 5/325 mg one p.o.     t.i.d. as needed 50 without refills for now.  The patient does express     desire to get off of narcotic based pain medication and the     interventional procedures should help in this regard.  5. Counseled patient on importance of smoking cessation.  6. Instructed patient on hamstring and hip flexor stretching exercises to be     performed daily.  7. Anticipate we can get patient back into some type of exercise program to     assist in her overall pain management.  8. The patient is to return to clinic next week for diagnostic/therapeutic     lumbar facet injections.   The patient was educated on the above findings and recommendations and  understands.  There were no barriers to communication.                                              Sondra Come, D.O.   JJW/MEDQ  D:  08/19/2001  T:  08/25/2001  Job:  [id number]   cc:   Tanya D. Daphine Deutscher, M.D.

## 2010-06-12 ENCOUNTER — Other Ambulatory Visit: Payer: Self-pay | Admitting: Orthopedic Surgery

## 2010-06-12 DIAGNOSIS — M545 Low back pain: Secondary | ICD-10-CM

## 2010-06-12 DIAGNOSIS — IMO0002 Reserved for concepts with insufficient information to code with codable children: Secondary | ICD-10-CM

## 2010-06-20 ENCOUNTER — Ambulatory Visit
Admission: RE | Admit: 2010-06-20 | Discharge: 2010-06-20 | Disposition: A | Discharge status: Home / Self Care | Payer: Medicare Other | Source: Ambulatory Visit | Attending: Orthopedic Surgery | Admitting: Orthopedic Surgery

## 2010-06-20 DIAGNOSIS — M545 Low back pain: Secondary | ICD-10-CM

## 2010-06-20 DIAGNOSIS — IMO0002 Reserved for concepts with insufficient information to code with codable children: Secondary | ICD-10-CM

## 2010-08-20 ENCOUNTER — Other Ambulatory Visit (HOSPITAL_COMMUNITY): Payer: Self-pay | Admitting: Orthopedic Surgery

## 2010-08-20 ENCOUNTER — Encounter (HOSPITAL_COMMUNITY)
Admission: RE | Admit: 2010-08-20 | Discharge: 2010-08-20 | Disposition: A | Discharge status: Home / Self Care | Payer: Medicare Other | Source: Ambulatory Visit | Attending: Orthopedic Surgery | Admitting: Orthopedic Surgery

## 2010-08-20 ENCOUNTER — Ambulatory Visit (HOSPITAL_COMMUNITY)
Admission: RE | Admit: 2010-08-20 | Discharge: 2010-08-20 | Disposition: A | Discharge status: Home / Self Care | Payer: Medicare Other | Source: Ambulatory Visit | Attending: Orthopedic Surgery | Admitting: Orthopedic Surgery

## 2010-08-20 DIAGNOSIS — R0602 Shortness of breath: Secondary | ICD-10-CM | POA: Insufficient documentation

## 2010-08-20 DIAGNOSIS — F17200 Nicotine dependence, unspecified, uncomplicated: Secondary | ICD-10-CM | POA: Insufficient documentation

## 2010-08-20 DIAGNOSIS — G8929 Other chronic pain: Secondary | ICD-10-CM

## 2010-08-20 DIAGNOSIS — Z01812 Encounter for preprocedural laboratory examination: Secondary | ICD-10-CM | POA: Insufficient documentation

## 2010-08-20 DIAGNOSIS — Z01818 Encounter for other preprocedural examination: Secondary | ICD-10-CM | POA: Insufficient documentation

## 2010-08-20 LAB — BASIC METABOLIC PANEL
CO2: 29 mEq/L (ref 19–32)
Calcium: 10.2 mg/dL (ref 8.4–10.5)
Sodium: 141 mEq/L (ref 135–145)

## 2010-08-20 LAB — SURGICAL PCR SCREEN
MRSA, PCR: NEGATIVE
Staphylococcus aureus: NEGATIVE

## 2010-08-20 LAB — CBC
HCT: 48.6 % — ABNORMAL HIGH (ref 36.0–46.0)
Hemoglobin: 16.8 g/dL — ABNORMAL HIGH (ref 12.0–15.0)
MCV: 93.3 fL (ref 78.0–100.0)
Platelets: 190 10*3/uL (ref 150–400)

## 2010-08-22 ENCOUNTER — Ambulatory Visit (HOSPITAL_COMMUNITY): Admission: RE | Admit: 2010-08-22 | Payer: Medicare Other | Source: Ambulatory Visit | Admitting: Orthopedic Surgery

## 2010-09-14 ENCOUNTER — Inpatient Hospital Stay (HOSPITAL_COMMUNITY)
Admission: RE | Admit: 2010-09-14 | Discharge: 2010-09-14 | Disposition: A | Discharge status: Home / Self Care | Payer: Medicare Other | Source: Ambulatory Visit | Attending: Family Medicine | Admitting: Family Medicine

## 2010-09-15 ENCOUNTER — Emergency Department (HOSPITAL_COMMUNITY)
Admission: EM | Admit: 2010-09-15 | Discharge: 2010-09-15 | Disposition: A | Discharge status: Home / Self Care | Payer: Medicare Other | Attending: Emergency Medicine | Admitting: Emergency Medicine

## 2010-09-15 DIAGNOSIS — IMO0002 Reserved for concepts with insufficient information to code with codable children: Secondary | ICD-10-CM | POA: Insufficient documentation

## 2010-09-15 DIAGNOSIS — E119 Type 2 diabetes mellitus without complications: Secondary | ICD-10-CM | POA: Insufficient documentation

## 2010-09-15 DIAGNOSIS — M79609 Pain in unspecified limb: Secondary | ICD-10-CM | POA: Insufficient documentation

## 2010-09-15 DIAGNOSIS — J45909 Unspecified asthma, uncomplicated: Secondary | ICD-10-CM | POA: Insufficient documentation

## 2010-09-15 DIAGNOSIS — M545 Low back pain, unspecified: Secondary | ICD-10-CM | POA: Insufficient documentation

## 2010-09-15 DIAGNOSIS — F319 Bipolar disorder, unspecified: Secondary | ICD-10-CM | POA: Insufficient documentation

## 2010-09-15 DIAGNOSIS — K219 Gastro-esophageal reflux disease without esophagitis: Secondary | ICD-10-CM | POA: Insufficient documentation

## 2010-09-15 DIAGNOSIS — E785 Hyperlipidemia, unspecified: Secondary | ICD-10-CM | POA: Insufficient documentation

## 2010-09-15 DIAGNOSIS — R51 Headache: Secondary | ICD-10-CM | POA: Insufficient documentation

## 2010-09-27 ENCOUNTER — Emergency Department (HOSPITAL_COMMUNITY)
Admission: EM | Admit: 2010-09-27 | Discharge: 2010-09-27 | Disposition: A | Discharge status: Home / Self Care | Payer: Medicare Other | Attending: Emergency Medicine | Admitting: Emergency Medicine

## 2010-09-27 ENCOUNTER — Emergency Department (HOSPITAL_COMMUNITY): Payer: Medicare Other

## 2010-09-27 DIAGNOSIS — J45909 Unspecified asthma, uncomplicated: Secondary | ICD-10-CM | POA: Insufficient documentation

## 2010-09-27 DIAGNOSIS — F411 Generalized anxiety disorder: Secondary | ICD-10-CM | POA: Insufficient documentation

## 2010-09-27 DIAGNOSIS — M549 Dorsalgia, unspecified: Secondary | ICD-10-CM | POA: Insufficient documentation

## 2010-09-27 DIAGNOSIS — M542 Cervicalgia: Secondary | ICD-10-CM | POA: Insufficient documentation

## 2010-09-27 DIAGNOSIS — R259 Unspecified abnormal involuntary movements: Secondary | ICD-10-CM | POA: Insufficient documentation

## 2010-09-27 DIAGNOSIS — E119 Type 2 diabetes mellitus without complications: Secondary | ICD-10-CM | POA: Insufficient documentation

## 2010-09-27 DIAGNOSIS — E785 Hyperlipidemia, unspecified: Secondary | ICD-10-CM | POA: Insufficient documentation

## 2010-09-27 DIAGNOSIS — G8929 Other chronic pain: Secondary | ICD-10-CM | POA: Insufficient documentation

## 2010-09-27 DIAGNOSIS — R110 Nausea: Secondary | ICD-10-CM | POA: Insufficient documentation

## 2010-09-27 DIAGNOSIS — H53149 Visual discomfort, unspecified: Secondary | ICD-10-CM | POA: Insufficient documentation

## 2010-09-27 DIAGNOSIS — F319 Bipolar disorder, unspecified: Secondary | ICD-10-CM | POA: Insufficient documentation

## 2010-09-27 DIAGNOSIS — H538 Other visual disturbances: Secondary | ICD-10-CM | POA: Insufficient documentation

## 2010-09-27 DIAGNOSIS — H11419 Vascular abnormalities of conjunctiva, unspecified eye: Secondary | ICD-10-CM | POA: Insufficient documentation

## 2010-09-27 DIAGNOSIS — Z79899 Other long term (current) drug therapy: Secondary | ICD-10-CM | POA: Insufficient documentation

## 2010-09-27 DIAGNOSIS — M62838 Other muscle spasm: Secondary | ICD-10-CM | POA: Insufficient documentation

## 2010-09-27 DIAGNOSIS — K219 Gastro-esophageal reflux disease without esophagitis: Secondary | ICD-10-CM | POA: Insufficient documentation

## 2010-09-27 DIAGNOSIS — R51 Headache: Secondary | ICD-10-CM | POA: Insufficient documentation

## 2010-09-27 LAB — URINALYSIS, ROUTINE W REFLEX MICROSCOPIC
Bilirubin Urine: NEGATIVE
Ketones, ur: NEGATIVE mg/dL
Nitrite: NEGATIVE
Protein, ur: NEGATIVE mg/dL
Specific Gravity, Urine: 1.022 (ref 1.005–1.030)
Urobilinogen, UA: 1 mg/dL (ref 0.0–1.0)

## 2010-09-27 LAB — RAPID URINE DRUG SCREEN, HOSP PERFORMED
Barbiturates: NOT DETECTED
Cocaine: NOT DETECTED

## 2010-10-01 ENCOUNTER — Encounter (HOSPITAL_COMMUNITY)
Admission: RE | Admit: 2010-10-01 | Discharge: 2010-10-01 | Disposition: A | Discharge status: Home / Self Care | Payer: Medicare Other | Source: Ambulatory Visit | Attending: Orthopedic Surgery | Admitting: Orthopedic Surgery

## 2010-10-01 LAB — CBC
HCT: 48.9 % — ABNORMAL HIGH (ref 36.0–46.0)
Hemoglobin: 17.7 g/dL — ABNORMAL HIGH (ref 12.0–15.0)
RBC: 5.43 MIL/uL — ABNORMAL HIGH (ref 3.87–5.11)
WBC: 13.2 10*3/uL — ABNORMAL HIGH (ref 4.0–10.5)

## 2010-10-01 LAB — SURGICAL PCR SCREEN: Staphylococcus aureus: NEGATIVE

## 2010-10-09 LAB — WET PREP, GENITAL
Trich, Wet Prep: NONE SEEN
Yeast Wet Prep HPF POC: NONE SEEN

## 2010-10-09 LAB — GC/CHLAMYDIA PROBE AMP, GENITAL
Chlamydia, DNA Probe: NEGATIVE
GC Probe Amp, Genital: NEGATIVE

## 2010-10-10 ENCOUNTER — Ambulatory Visit (HOSPITAL_COMMUNITY)
Admission: RE | Admit: 2010-10-10 | Discharge: 2010-10-11 | DRG: 093 | Disposition: A | Discharge status: Home / Self Care | Payer: Medicare Other | Source: Ambulatory Visit | Attending: Orthopedic Surgery | Admitting: Orthopedic Surgery

## 2010-10-10 ENCOUNTER — Ambulatory Visit (HOSPITAL_COMMUNITY): Payer: Medicare Other

## 2010-10-10 DIAGNOSIS — M549 Dorsalgia, unspecified: Secondary | ICD-10-CM | POA: Insufficient documentation

## 2010-10-10 DIAGNOSIS — G8929 Other chronic pain: Secondary | ICD-10-CM | POA: Insufficient documentation

## 2010-10-10 DIAGNOSIS — Z01812 Encounter for preprocedural laboratory examination: Secondary | ICD-10-CM | POA: Insufficient documentation

## 2010-10-10 LAB — GC/CHLAMYDIA PROBE AMP, GENITAL
Chlamydia, DNA Probe: NEGATIVE
GC Probe Amp, Genital: NEGATIVE

## 2010-10-10 LAB — CBC
HCT: 43.1
Hemoglobin: 15.1 — ABNORMAL HIGH
MCHC: 34.9
MCHC: 35.2
MCV: 90.8
Platelets: 212
Platelets: 235
RBC: 4.75
RDW: 12.6
RDW: 12.8
WBC: 9

## 2010-10-10 LAB — RAPID URINE DRUG SCREEN, HOSP PERFORMED
Amphetamines: NOT DETECTED
Benzodiazepines: POSITIVE — AB
Opiates: NOT DETECTED
Tetrahydrocannabinol: NOT DETECTED

## 2010-10-10 LAB — COMPREHENSIVE METABOLIC PANEL
ALT: 25
AST: 21
Calcium: 9.7
GFR calc Af Amer: >60
Sodium: 142
Total Protein: 6.8

## 2010-10-10 LAB — DIFFERENTIAL
Basophils Absolute: 0.1
Basophils Relative: 1
Eosinophils Absolute: 0.1
Eosinophils Absolute: 0.1
Eosinophils Relative: 1
Eosinophils Relative: 2
Lymphocytes Relative: 44
Lymphs Abs: 2.2
Lymphs Abs: 3.9
Monocytes Absolute: 0.6
Monocytes Relative: 5
Monocytes Relative: 7
Neutro Abs: 4.2
Neutrophils Relative %: 47

## 2010-10-10 LAB — BASIC METABOLIC PANEL
BUN: 3 — ABNORMAL LOW
Calcium: 9.4
Chloride: 108
Creatinine, Ser: 0.78
GFR calc Af Amer: >60
GFR calc non Af Amer: >60

## 2010-10-10 LAB — URINALYSIS, ROUTINE W REFLEX MICROSCOPIC
Bilirubin Urine: NEGATIVE
Glucose, UA: NEGATIVE
Hgb urine dipstick: NEGATIVE
Ketones, ur: NEGATIVE
Nitrite: NEGATIVE
Protein, ur: NEGATIVE
Specific Gravity, Urine: 1.02
Urobilinogen, UA: 0.2
pH: 6.5

## 2010-10-10 LAB — WET PREP, GENITAL
Clue Cells Wet Prep HPF POC: NONE SEEN
Trich, Wet Prep: NONE SEEN
Yeast Wet Prep HPF POC: NONE SEEN

## 2010-10-10 LAB — BASIC METABOLIC PANEL WITH GFR
CO2: 28
Glucose, Bld: 90
Potassium: 4.4
Sodium: 141

## 2010-10-10 LAB — URINE CULTURE: Colony Count: 10000

## 2010-10-11 LAB — CBC
Hemoglobin: 15
MCHC: 34
Platelets: 178
RDW: 12.4

## 2010-10-11 LAB — CARDIAC PANEL(CRET KIN+CKTOT+MB+TROPI)
CK, MB: 5.3 ng/mL — ABNORMAL HIGH (ref 0.3–4.0)
Relative Index: 1.5 (ref 0.0–2.5)
Relative Index: 1.4 (ref 0.0–2.5)
Total CK: 350 U/L — ABNORMAL HIGH (ref 7–177)
Total CK: 383 U/L — ABNORMAL HIGH (ref 7–177)
Troponin I: <0.3 ng/mL (ref <0.30)

## 2010-10-11 LAB — COMPREHENSIVE METABOLIC PANEL
ALT: 21
Albumin: 3.6
Alkaline Phosphatase: 31 — ABNORMAL LOW
BUN: 8
Calcium: 9
Glucose, Bld: 94
Potassium: 3.9
Sodium: 132 — ABNORMAL LOW
Total Protein: 6.1

## 2010-10-11 LAB — DIFFERENTIAL
Lymphs Abs: 2.6
Monocytes Absolute: 0.6
Monocytes Relative: 7
Neutro Abs: 4.7
Neutrophils Relative %: 58

## 2010-10-14 LAB — COMPREHENSIVE METABOLIC PANEL
AST: 19
Albumin: 3.4 — ABNORMAL LOW
Alkaline Phosphatase: 42
BUN: 3 — ABNORMAL LOW
GFR calc Af Amer: >60
Potassium: 4.1
Sodium: 138
Total Protein: 6.1

## 2010-10-14 LAB — URINALYSIS, ROUTINE W REFLEX MICROSCOPIC
Ketones, ur: NEGATIVE
Nitrite: NEGATIVE
Protein, ur: NEGATIVE
Urobilinogen, UA: 1

## 2010-10-14 LAB — CBC
Platelets: 216
RDW: 13.3
WBC: 10.6 — ABNORMAL HIGH

## 2010-10-14 LAB — DIFFERENTIAL
Basophils Relative: 1
Eosinophils Relative: 2
Monocytes Absolute: 0.7
Monocytes Relative: 7
Neutro Abs: 6.3

## 2010-10-14 LAB — POCT I-STAT CREATININE
Creatinine, Ser: 0.8
Operator id: 294511

## 2010-10-14 LAB — I-STAT 8, (EC8 V) (CONVERTED LAB)
Acid-base deficit: 3 — ABNORMAL HIGH
BUN: <3 — ABNORMAL LOW
Operator id: 294511
Sodium: 139
pCO2, Ven: 41.6 — ABNORMAL LOW
pH, Ven: 7.347 — ABNORMAL HIGH

## 2010-10-14 LAB — VALPROIC ACID LEVEL: Valproic Acid Lvl: 27.7 — ABNORMAL LOW

## 2010-10-14 LAB — POCT PREGNANCY, URINE
Operator id: 285841
Preg Test, Ur: NEGATIVE

## 2010-10-15 NOTE — Op Note (Signed)
NAMEKAYLLA, COBOS NO.:  [id number]  MEDICAL RECORD NO.:  [id number]  LOCATION:                                 FACILITY:  PHYSICIAN:  Alvy Beal, MD    DATE OF BIRTH:  [birth year]  DATE OF PROCEDURE: DATE OF DISCHARGE:                              OPERATIVE REPORT   PREOPERATIVE DIAGNOSIS:  Chronic pain.  POSTOPERATIVE DIAGNOSIS:  Chronic pain.  OPERATIVE PROCEDURE:  Implantation of spinal cord stimulator. Instrumentation system used is the St. Jude tripolar lead placed from the midbody of T8 to the midbody of T9 consistent with preoperative trial.  COMPLICATIONS:  None.  ESTIMATED BLOOD LOSS:  Minimal blood loss.  FIRST ASSISTANT:  Norval Gable, [place].  HISTORY:  This is a very pleasant 50 year old woman who has had chronic debilitating pain.  She had a trial stimulator placed by Dr. Jari Sportsman and had excellent result.  She was referred to me for permanent implantation.  The patient had subsequently left that practice and is now at [facility] who is managing her chronic pain medications and issues.  She requested a permanent implantation given the positive results of her trial.  All appropriate risks, benefits, and alternative of the surgery were discussed with the patient, and consent was obtained.  OPERATIVE NOTE:  The patient is brought to the operating room, placed supine on the operating table.  After successful induction of general anesthesia endotracheal intubation, the patient was turned prone onto the Wilson frame.  All bony prominences were well-padded.  Appropriate time-out was then done confirming patient procedure and all other pertinent important data.  It should be noted that in the preop holding area, the patient did indicate she want a left-sided battery placement, and I did in a standing position, identified the incision sites for this.  Once the patient was prepped and draped and the time-out  was completed, we proceeded with the surgery.  Using lateral fluoroscopy, identified the T10-T11 vertebral bodies and centered my incision over this level.  Sharp dissection was carried down to the deep fascia.  Deep fascia was sharply incised and I exposed the T10-T11 posterior elements, this is spinous process and lamina.  I then placed in self-retaining retractor and then encountered again both in the AP and lateral planes using the last rib bearing vertebrae T12.  I again confirmed the T10 space.  Using a double-action Leksell rongeur, I removed the majority of the T10 spinous process.  I used a small nerve curette to develop a plane underneath the ligamentum flavum and then used a 2-mm Kerrison to perform a generous laminotomy of T10.  I kept the ligamentum flavum intact to protect the underlying thecal sac.  I then used a Penfield four to develop plane through the central raphe and between the ligamentum flavum and the underlying dura.  I then used a 2- mm Kerrison to resect the ligamentum flavum and exposed the underlying thecal sac.  Once the dura was identified, I then passed a dural spatula up along the dorsal surface of the thecal sac without any problems.  I then placed the tripolar lead from about the midportion of  T8 to the inferior aspect of T9 which according to the [facility]. Jude's wrap was where she was doing the majority of her program.  This was midline and centered between the pedicles.  Once I had satisfactory positioning of the leads, I then made two bone holes in the T11 spinous process and then secured the leads down to the spinous process through the bone holes with FiberWire.  I then wrapped them around the T11-T12 interspace.  A second incision was made over the left gluteal region for the battery.  I dissected down a depth of 2-1/2 cm and created a pocket. I then used the submuscular transferring device and ultimately passed the leads submuscular to the battery  incision site.  I then secured them to the battery and tested it.  All leads were functioning properly.  I then wrapped the leads underneath the battery and secured the battery to the deep fascia with two #1 Vicryl sutures.  With the battery now in the pocket, I re-tested it to make sure that it was working satisfactory in the body.  Once this was done, all wounds were copiously irrigated with normal saline.  Hemostasis was obtained using bipolar electrocautery. The deep fascia of both wounds were closed with interrupted #1 Vicryl sutures and then superficial 2-0 Vicryl sutures, and 3-0 Monocryl for both skin sites.  Steri-Strips, dry dressings were applied.  The patient was extubated, transferred to the PACU without incident.  At the end of case, all needle and sponge counts were correct.  Norval Gable, my PA was instrumental in assisting with the wound closure, suction, maintaining, assisting in tying leads, and retraction.     Alvy Beal, MD     DDB/MEDQ  D:  10/10/2010  T:  10/10/2010  Job:  [id number]  Electronically Signed by Venita Lick MD on 10/15/2010 08:46:05 AM

## 2010-10-17 LAB — CBC
HCT: 46
HCT: 37.4
HCT: 41.8
HCT: 46.9 — ABNORMAL HIGH
Hemoglobin: 15.7 — ABNORMAL HIGH
Hemoglobin: 12.9
Hemoglobin: 16.1 — ABNORMAL HIGH
MCHC: 34.1
MCHC: 34.2
MCV: 91.1
Platelets: 216
Platelets: 198
RBC: 5.19 — ABNORMAL HIGH
RDW: 12.3
RDW: 12.9
WBC: 10.7 — ABNORMAL HIGH

## 2010-10-17 LAB — RAPID URINE DRUG SCREEN, HOSP PERFORMED
Barbiturates: NOT DETECTED
Barbiturates: NOT DETECTED
Opiates: NOT DETECTED

## 2010-10-17 LAB — COMPREHENSIVE METABOLIC PANEL
ALT: 19
Alkaline Phosphatase: 44
BUN: 5 — ABNORMAL LOW
CO2: 19
GFR calc non Af Amer: 52 — ABNORMAL LOW
Glucose, Bld: 112 — ABNORMAL HIGH
Potassium: 3.3 — ABNORMAL LOW
Sodium: 141

## 2010-10-17 LAB — HEPATIC FUNCTION PANEL
ALT: 16
AST: 24
Bilirubin, Direct: 0.2
Total Bilirubin: 0.8

## 2010-10-17 LAB — BASIC METABOLIC PANEL
BUN: 5 — ABNORMAL LOW
CO2: 21
Chloride: 110
Glucose, Bld: 110 — ABNORMAL HIGH
Potassium: 3.3 — ABNORMAL LOW

## 2010-10-17 LAB — URINALYSIS, ROUTINE W REFLEX MICROSCOPIC
Bilirubin Urine: NEGATIVE
Glucose, UA: NEGATIVE
Glucose, UA: NEGATIVE
Hgb urine dipstick: NEGATIVE
Hgb urine dipstick: NEGATIVE
Ketones, ur: NEGATIVE
Ketones, ur: NEGATIVE
Ketones, ur: NEGATIVE
Nitrite: NEGATIVE
Nitrite: NEGATIVE
Protein, ur: NEGATIVE
Specific Gravity, Urine: 1.007
Urobilinogen, UA: 1
Urobilinogen, UA: 1
pH: 7

## 2010-10-17 LAB — DIFFERENTIAL
Basophils Absolute: 0.1
Basophils Absolute: 0.1
Basophils Relative: 1
Basophils Relative: 1
Eosinophils Absolute: 0.1
Eosinophils Absolute: 0.1
Eosinophils Relative: 1
Monocytes Absolute: 0.5
Neutrophils Relative %: 66

## 2010-10-17 LAB — POCT PREGNANCY, URINE
Operator id: 22937
Preg Test, Ur: NEGATIVE

## 2010-10-17 LAB — SALICYLATE LEVEL: Salicylate Lvl: <4

## 2010-10-17 LAB — POCT I-STAT, CHEM 8
Calcium, Ion: 1.22
Creatinine, Ser: 1
Glucose, Bld: 90
HCT: 45
Hemoglobin: 15.3 — ABNORMAL HIGH
Potassium: 3.9
TCO2: 26

## 2010-10-17 LAB — ETHANOL: Alcohol, Ethyl (B): <5

## 2010-10-17 LAB — VALPROIC ACID LEVEL: Valproic Acid Lvl: <1 — ABNORMAL LOW

## 2010-10-17 LAB — ACETAMINOPHEN LEVEL: Acetaminophen (Tylenol), Serum: <10 — ABNORMAL LOW

## 2010-10-21 LAB — GLUCOSE, CAPILLARY

## 2010-10-23 LAB — POCT I-STAT, CHEM 8
BUN: 6
Calcium, Ion: 1.18
Chloride: 109
Creatinine, Ser: 0.8
Glucose, Bld: 96

## 2010-10-29 ENCOUNTER — Emergency Department (HOSPITAL_COMMUNITY)
Admission: EM | Admit: 2010-10-29 | Discharge: 2010-11-01 | Disposition: A | Discharge status: Other Acute Inpatient Hospital | Payer: Medicare Other | Source: Home / Self Care | Attending: Emergency Medicine | Admitting: Emergency Medicine

## 2010-10-29 DIAGNOSIS — F319 Bipolar disorder, unspecified: Secondary | ICD-10-CM | POA: Insufficient documentation

## 2010-10-29 DIAGNOSIS — K219 Gastro-esophageal reflux disease without esophagitis: Secondary | ICD-10-CM | POA: Insufficient documentation

## 2010-10-29 DIAGNOSIS — E119 Type 2 diabetes mellitus without complications: Secondary | ICD-10-CM | POA: Insufficient documentation

## 2010-10-29 DIAGNOSIS — E876 Hypokalemia: Secondary | ICD-10-CM | POA: Insufficient documentation

## 2010-10-29 DIAGNOSIS — J45909 Unspecified asthma, uncomplicated: Secondary | ICD-10-CM | POA: Insufficient documentation

## 2010-10-29 DIAGNOSIS — Z79899 Other long term (current) drug therapy: Secondary | ICD-10-CM | POA: Insufficient documentation

## 2010-10-29 DIAGNOSIS — F29 Unspecified psychosis not due to a substance or known physiological condition: Secondary | ICD-10-CM | POA: Insufficient documentation

## 2010-10-29 DIAGNOSIS — G8929 Other chronic pain: Secondary | ICD-10-CM | POA: Insufficient documentation

## 2010-10-29 DIAGNOSIS — E785 Hyperlipidemia, unspecified: Secondary | ICD-10-CM | POA: Insufficient documentation

## 2010-10-29 DIAGNOSIS — M549 Dorsalgia, unspecified: Secondary | ICD-10-CM | POA: Insufficient documentation

## 2010-10-30 LAB — COMPREHENSIVE METABOLIC PANEL
ALT: 8 U/L (ref 0–35)
AST: 9 U/L (ref 0–37)
Albumin: 4.2 g/dL (ref 3.5–5.2)
CO2: 20 mEq/L (ref 19–32)
Calcium: 9.9 mg/dL (ref 8.4–10.5)
Creatinine, Ser: 0.64 mg/dL (ref 0.50–1.10)
GFR calc non Af Amer: >90 mL/min (ref >90)
Sodium: 138 mEq/L (ref 135–145)

## 2010-10-30 LAB — CBC
HCT: 43.5 % (ref 36.0–46.0)
Hemoglobin: 15.3 g/dL — ABNORMAL HIGH (ref 12.0–15.0)
MCHC: 35.2 g/dL (ref 30.0–36.0)
MCV: 90.2 fL (ref 78.0–100.0)
RDW: 12.5 % (ref 11.5–15.5)

## 2010-10-30 LAB — DIFFERENTIAL
Eosinophils Relative: 1 % (ref 0–5)
Lymphocytes Relative: 36 % (ref 12–46)
Lymphs Abs: 3.4 10*3/uL (ref 0.7–4.0)
Monocytes Absolute: 1 10*3/uL (ref 0.1–1.0)

## 2010-10-30 LAB — LITHIUM LEVEL: Lithium Lvl: <0.25 mEq/L — ABNORMAL LOW (ref 0.80–1.40)

## 2010-10-30 LAB — URINALYSIS, ROUTINE W REFLEX MICROSCOPIC
Glucose, UA: NEGATIVE mg/dL
Hgb urine dipstick: NEGATIVE
Ketones, ur: 15 mg/dL — AB
pH: 6.5 (ref 5.0–8.0)

## 2010-10-30 LAB — RAPID URINE DRUG SCREEN, HOSP PERFORMED
Barbiturates: NOT DETECTED
Benzodiazepines: POSITIVE — AB
Tetrahydrocannabinol: NOT DETECTED

## 2010-11-01 ENCOUNTER — Inpatient Hospital Stay (HOSPITAL_COMMUNITY)
Admission: AD | Admit: 2010-11-01 | Discharge: 2010-11-06 | DRG: 897 | Disposition: A | Discharge status: Home / Self Care | Payer: Medicare Other | Attending: Psychiatry | Admitting: Psychiatry

## 2010-11-01 DIAGNOSIS — F609 Personality disorder, unspecified: Secondary | ICD-10-CM

## 2010-11-01 DIAGNOSIS — J45909 Unspecified asthma, uncomplicated: Secondary | ICD-10-CM

## 2010-11-01 DIAGNOSIS — K219 Gastro-esophageal reflux disease without esophagitis: Secondary | ICD-10-CM

## 2010-11-01 DIAGNOSIS — F411 Generalized anxiety disorder: Secondary | ICD-10-CM

## 2010-11-01 DIAGNOSIS — Z560 Unemployment, unspecified: Secondary | ICD-10-CM

## 2010-11-01 DIAGNOSIS — G8929 Other chronic pain: Secondary | ICD-10-CM

## 2010-11-01 DIAGNOSIS — F1320 Sedative, hypnotic or anxiolytic dependence, uncomplicated: Principal | ICD-10-CM

## 2010-11-01 DIAGNOSIS — F17200 Nicotine dependence, unspecified, uncomplicated: Secondary | ICD-10-CM

## 2010-11-01 DIAGNOSIS — F319 Bipolar disorder, unspecified: Secondary | ICD-10-CM

## 2010-11-01 DIAGNOSIS — E119 Type 2 diabetes mellitus without complications: Secondary | ICD-10-CM

## 2010-11-01 DIAGNOSIS — M549 Dorsalgia, unspecified: Secondary | ICD-10-CM

## 2010-11-02 DIAGNOSIS — F1520 Other stimulant dependence, uncomplicated: Secondary | ICD-10-CM

## 2010-11-05 NOTE — Assessment & Plan Note (Signed)
Rebecca [name], BIBLE NO.:  [id number]  MEDICAL RECORD NO.:  [id number]  LOCATION:  0406                          FACILITY:  [facility]  PHYSICIAN:  Eulogio Ditch, MD DATE OF BIRTH:  [birth year]  DATE OF ADMISSION:  11/01/2010 DATE OF DISCHARGE:                      PSYCHIATRIC ADMISSION ASSESSMENT   This is an involuntary commitment to the services of Dr. Rogers Blocker.  Apparently she had called some family members, and her sister was one of the family members she called.  She recently had a spinal cord stimulator implanted on October 11, 2010, for chronic pain.  She was talking nonsense.  Her sister called her daughter, who went over to the house.  The daughter also found her to have an altered mental status and hence she called EMS.  They stated that she had been off of her psychiatric meds for at least a year.  It was unknown whether she was taking any other meds.  Originally they felt that she was confused. They could not stabilize her, and due to the recent implant they felt it best to bring her to the hospital.  PAST PSYCHIATRIC HISTORY:  She was with [name] December 19 to January 11, 2009, and also June 18 to July 12, 2007.  SOCIAL HISTORY:  She is a high school graduate in 65.  Married and divorced x4.  She has a daughter, age 15.  She is not employed at present.  She currently gets Hydrologist.  FAMILY HISTORY:  She denies.  ALCOHOL AND DRUG HISTORY:  Nothing recent.  PAST MEDICAL PROBLEMS:  She is status post a spinal cord implant on September 21 for chronic pain, apparently with good results.  She is also thought to have GERD and asthma.  It says diabetes, but she is not on any diabetes medications.  PRIMARY CARE PROVIDER:  [facility].  She talks to a therapist, a Dr. Evelena Peat.  I called [facility] on [address], [phone].  She was given 90 oxycodone 10/650 on August 28.  She was also given Valium 10 mg #30 on that same  date, and Benadryl 50 mg p.o. q.6 h p.r.n. for 3 days, and she reports Lipitor 20 mg p.o. daily, dicyclomine q.i.d. 10, and gabapentin 600 mg t.i.d.  DRUG ALLERGIES:  She denies.  POSITIVE PHYSICAL FINDINGS:  Her urine drug screen was positive for benzos.  She had no alcohol and her lithium level was checked but it was 0.25.  Her vital signs in the emergency room showed that she was afebrile at 97.4 to 98.6.  Her pulse ranged from 80 to 114. Respirations were 18 to 20.  Blood pressure ranged from 99/67 to 168/132.  Urinalysis was negative.  Her CBC had no unusual findings. Her potassium was a little low at 3.  She was positive for benzodiazepines.  Her incision site for her spinal cord stimulator is clean, dry and intact.  No evidence for infection.  MENTAL STATUS EXAM:  Tonight she is alert and oriented.  She is appropriately groomed, dressed and nourished in hospital scrubs.  Her speech is normal rate, rhythm, and tone.  Her mood is appropriate to the situation.  Her affect has a  normal range.  Her thought processes are clear, rational, goal oriented.  Judgment and insight are good. Concentration and memory are good.  Intelligence is at least average. She denied being suicidal or homicidal.  She denied having any auditory or visual hallucinations.  Apparently she told the intake people that she had just received a letter of eviction and that may have caused her altered mental status.  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 Axis III:  Recent spinal cord stimulator implanted on September 21. History for gastroesophageal reflux disease.  Chronic pain has been relieved with the implant. Axis IV:  Housing and relationship issues. Axis V:  45.  The plan is to admit for safety and stabilization.  The need for psychotropic medication will be assessed.  She has not been on any meds recently and does not really appear to need to  be on any.  Estimated length of stay is 3 to 5 days.     Mickie Leonarda Salon, P.A.-C.   ______________________________ Eulogio Ditch, MD    MD/MEDQ  D:  11/01/2010  T:  11/01/2010  Job:  [id number]  Electronically Signed by Jaci Lazier ADAMS P.A.-C. on 11/04/2010 11:50:19 AM Electronically Signed by Eulogio Ditch  on 11/05/2010 09:56:55 AM

## 2010-11-06 LAB — I-STAT 8, (EC8 V) (CONVERTED LAB)
BUN: 8
Bicarbonate: 24.7 — ABNORMAL HIGH
Chloride: 108
Glucose, Bld: 86
HCT: 48 — ABNORMAL HIGH
Hemoglobin: 16.3 — ABNORMAL HIGH
Operator id: 288831
pCO2, Ven: 46.2

## 2010-11-06 LAB — RAPID URINE DRUG SCREEN, HOSP PERFORMED
Amphetamines: NOT DETECTED
Benzodiazepines: POSITIVE — AB
Cocaine: NOT DETECTED

## 2010-11-06 LAB — URINALYSIS, ROUTINE W REFLEX MICROSCOPIC
Bilirubin Urine: NEGATIVE
Bilirubin Urine: NEGATIVE
Glucose, UA: NEGATIVE
Hgb urine dipstick: NEGATIVE
Ketones, ur: 15 — AB
Nitrite: NEGATIVE
Protein, ur: NEGATIVE
Specific Gravity, Urine: 1.011
Urobilinogen, UA: 1
pH: 6.5

## 2010-11-06 LAB — COMPREHENSIVE METABOLIC PANEL
ALT: 33
AST: 20
Albumin: 3.8
Alkaline Phosphatase: 37 — ABNORMAL LOW
BUN: 8
Chloride: 110
GFR calc Af Amer: >60
Potassium: 4.1
Sodium: 142
Total Bilirubin: 1
Total Protein: 6.1

## 2010-11-06 LAB — POCT I-STAT CREATININE
Creatinine, Ser: 0.9
Operator id: 288831

## 2010-11-06 LAB — POCT CARDIAC MARKERS
Myoglobin, poc: 43.1
Operator id: 288831

## 2010-11-06 LAB — ETHANOL: Alcohol, Ethyl (B): 7

## 2010-11-07 LAB — URINALYSIS, ROUTINE W REFLEX MICROSCOPIC
Glucose, UA: NEGATIVE
Hgb urine dipstick: NEGATIVE
Ketones, ur: 15 — AB
Specific Gravity, Urine: 1.024
pH: 7

## 2010-11-08 NOTE — Discharge Summary (Signed)
NAMEAPRILL, Rebecca [name] NO.:  [id number]  MEDICAL RECORD NO.:  [id number]  LOCATION:  0305                          FACILITY:  BH  PHYSICIAN:  Orson Aloe, MD       DATE OF BIRTH:  [birth year]  DATE OF ADMISSION:  11/01/2010 DATE OF DISCHARGE:  11/06/2010                              DISCHARGE SUMMARY   This is an involuntary commitment to the service of Dr. Rogers Blocker. Apparently she called some family members and her sister was one of the family members she called.  She recently had a spinal cord stimulator implanted October 11, 2010 for chronic pain.  She has been talking nonsense.  Her sister called her daughter.  Her daughter went to the house.  The daughter found her in an altered state of consciousness and hence called EMS.  She stated that she had been off her psychotropic medicines for at least a year.  It was unknown whether she was taking any other meds.  Originally they felt that she was confused, they could not stabilize her and due to recent implant, they felt it best to bring her into the hospital.  SIGNIFICANT FINDINGS:  Urine drug screen was positive for benzo's.  She had no alcohol and her lithium level was checked that was 0.25.  Her vital signs in the emergency room showed that she was afebrile. Potassium was low at 3.  She was positive for benzo's.  Urinalysis was negative.  CBC had no unusual findings.  ADMITTING DIAGNOSES:  Axis I:  Mood disorder, NOS.  Personality disorder, NOS. Axis II:  Married and divorced 4 times. Axis III:  Recent spinal cord stimulator implant on October 11, 2010, history of gastroesophageal reflux disease and chronic pain that has been relieved by the implant. Axis IV:  Housing and relationship issues. Axis V:  46.  The patient was admitted, given Ativan for anxiety q.6 hours, Vistaril 50 mg at bedtime.  She took the Ativan pretty regularly.  She was started on Neurontin 600 mg 3 times a day for anxiety  and Bentyl 10 mg q.i.d. p.r.n. for nausea and then given a nicotine patch.  She was moved to 300 hall and given Cepacol lozenges for sore throat.  She stated that made her mouth numb, so she did not take it anymore.  It was requested that the staff call the daughter for family discharged session.  She was noted to have been on Valium 10 mg 3 times a day for years, up until September 17, 2010 at which time, her family doctor prescribes for only 1 a day for a month and then on the 6 weeks point after that, she was noted to be quite confused.  This could actually be benzo withdrawal problems. Tegretol 200 mg 3 times a day was started for that and Thorazine 25 mg twice a day was also started to replace the anxiety affect that the Valium was helping for her.  The Ativan was stopped altogether.  She noted she was having more anxiety off of the Ativan and requested to have Bentyl changed to routine since she takes it routinely at home, 10 mg 2 tablets  3 times a day was ordered for her.  Neurontin 600 mg increased to 4 times a day and Tegretol was increased to 200 mg 3 times a day and 400 at bedtime to help with her anxiety and pain management. Her daughter however,  informed us that she had a past history of being on Depakote which caused her to gain a whole lot of extra weight and get diabetes and that when she stopped it she lost a whole lot of weight, but also her mood became dysregulated and she was worst.  We are switching her to Tegretol and pushing that to see if that would help with her.  We will discharge the patient to home with samples.  During the hospital course, she was not overly involved in groups, and in fact, she tended to kind of be rather stealth in her participation and the suicide prevention education interactions.  It was noted that she has no weapons at home and events of previous suicidal ideation, but she would tend to be inconsistent with her medications.  She was  eventually changed to the 300 hall and she attended group briefly.  She left before check in, found her in the hallway during group time, stated she plans to return to her apartment and as the Proventil was poisoning her, states that she needs to get rid of the tank when she gets home.  She said she met with her therapist in the past and would like to return. She also attends a pain clinic doctor on [facility].  It was thought that the propane smell that she was noticing in her apartment may have been an olfactory hallucinations associated with partial onset seizures, the result of her stopping the Valium.  The patient seems to be very uninterested in having the diagnosis of bipolar disorder, but is interested in having her pain relieved.  It seems that the combination of the increase in Tegretol and the increase in Neurontin along with the Thorazine has helped her.  She asked to have the Thorazine increased to 3 times a day and we will order that upon discharge. She will be sent home with scripts, follow up with [facility] and [facility]. She asked about an act team for services and we will contact them to seeif they would be willing to do that.  CONDITION ON DISCHARGE:  She denied any suicidal or homicidal ideation. Denied hallucinations, illusions or delusions.  She described her anxiety as being a 5 and depression as being a 1 on a scale from 1 the least, 10 the most.  She had good eye contact, and, in some obvious pain and needs to adjust her back pain equipment for optimal effect.  She had natural conversational speech, volume rate and tone.  She is oriented x4. Had recent and remote memory that was intact.  Her judgment seemed to deny.  She denies learning anything from coming here and her insight seemed to be quite limited.  DISCHARGE DIAGNOSIS:  Axis I:  Benzo dependence.  Probable partial onset seizure as a result of stopping benzo's a couple weeks ago.   History of bipolar disorder.  History of anxiety disorder. Axis II:  Deferred. Axis III:  Chronic back pain.  Injury on the job and injury at home. History of gastroesophageal reflux disease.  Recent spinal cord stimulator implanted on October 11, 2010. Axis IV:  Moderate psychosocial issues related to substance use and psychosocial issues related to chronic pain. Axis V:  55.  DISCHARGE RECOMMENDATION: The patient was recommended to resume typical diet, but focus on protein and water instead of and ignore any hunger for carbohydrates.  Also to resume typical activities as tolerated.  Also continue  the Tegretol 200 mg 3 times a day and 400 mg at nighttime, Thorazine 25 mg 3 times a day, Bentyl 20 mg 3 times a day, gabapentin 600 mg 4 times a day and Tylenol for pain as needed.  DISCHARGE PLACEMENT:  Return home and follow up at [facility] on November 11, 2010 at 8:00 am and [facility] on November 08, 2010 at 830.          ______________________________ Orson Aloe, MD     EW/MEDQ  D:  11/06/2010  T:  11/07/2010  Job:  [id number]  Electronically Signed by Orson Aloe  on 11/08/2010 10:23:17 AM

## 2010-12-08 ENCOUNTER — Encounter: Payer: Self-pay | Admitting: *Deleted

## 2010-12-08 ENCOUNTER — Emergency Department (INDEPENDENT_AMBULATORY_CARE_PROVIDER_SITE_OTHER)
Admission: EM | Admit: 2010-12-08 | Discharge: 2010-12-08 | Disposition: A | Discharge status: Home / Self Care | Payer: Medicare Other | Source: Home / Self Care | Attending: Family Medicine | Admitting: Family Medicine

## 2010-12-08 DIAGNOSIS — G8929 Other chronic pain: Secondary | ICD-10-CM

## 2010-12-08 DIAGNOSIS — J069 Acute upper respiratory infection, unspecified: Secondary | ICD-10-CM

## 2010-12-08 HISTORY — DX: Other chronic pain: G89.29

## 2010-12-08 HISTORY — DX: Gastro-esophageal reflux disease without esophagitis: K21.9

## 2010-12-08 HISTORY — DX: Unspecified mood (affective) disorder: F39

## 2010-12-08 HISTORY — DX: Dorsalgia, unspecified: M54.9

## 2010-12-08 MED ORDER — KETOROLAC TROMETHAMINE 10 MG PO TABS: 10.0000 mg | ORAL_TABLET | Freq: Four times a day (QID) | ORAL | Status: AC | PRN

## 2010-12-08 MED ORDER — DM-GUAIFENESIN ER 30-600 MG PO TB12: 1.0000 | ORAL_TABLET | Freq: Two times a day (BID) | ORAL | Status: DC

## 2010-12-08 MED ORDER — KETOROLAC TROMETHAMINE 60 MG/2ML IM SOLN
60.0000 mg | Freq: Once | INTRAMUSCULAR | Status: AC
  Administered 2010-12-08: 60 mg via INTRAMUSCULAR

## 2010-12-08 MED ORDER — KETOROLAC TROMETHAMINE 60 MG/2ML IM SOLN
INTRAMUSCULAR | Status: AC
  Filled 2010-12-08: qty 2

## 2010-12-08 NOTE — ED Provider Notes (Signed)
History     CSN: [id number] Arrival date & time: 12/08/2010  4:00 PM   First MD Initiated Contact with Patient 12/08/10 1442      Chief Complaint  Patient presents with  . Back Pain    pain back from nerve stimulator implant  also c/o cough congestion     (Consider location/radiation/quality/duration/timing/severity/associated sxs/prior treatment) Patient is a 50 y.o. female presenting with cough. The history is provided by the patient.  Cough This is a new problem. The current episode started more than 2 days ago. The problem occurs constantly. The problem has not changed since onset.The cough is non-productive. There has been no fever. Associated symptoms include chills, rhinorrhea and sore throat. She has tried nothing for the symptoms. She is a smoker.  states she is also having pain at the sight of her nerve stimulator. States she had the setting increased 3 days ago. Her pcp is not in.   Past Medical History  Diagnosis Date  . Mood disorder   . GERD (gastroesophageal reflux disease)   . Chronic pain   . Back pain     Past Surgical History  Procedure Date  . Mood disorder   . Spinal cord stimulator implant     History reviewed. No pertinent family history.  History  Substance Use Topics  . Smoking status: Current Everyday Smoker  . Smokeless tobacco: Not on file  . Alcohol Use: Yes    OB History    Grav Para Term Preterm Abortions TAB SAB Ect Mult Living                  Review of Systems  Constitutional: Positive for chills.  HENT: Positive for sore throat and rhinorrhea.   Respiratory: Positive for cough.     Allergies  Ciprofloxacin; Keflex; Reglan; Restoril; Risperdal; and Sulfa antibiotics  Home Medications   Current Outpatient Rx  Name Route Sig Dispense Refill  . ATORVASTATIN CALCIUM 20 MG PO TABS Oral Take 20 mg by mouth daily.      Marland Kitchen CARBAMAZEPINE 200 MG PO TABS Oral Take 200 mg by mouth 3 (three) times daily.      Marland Kitchen DIAZEPAM 10 MG PO  TABS Oral Take 10 mg by mouth 1 day or 1 dose.      Marland Kitchen DICYCLOMINE HCL 20 MG PO TABS Oral Take 20 mg by mouth every 6 (six) hours.      Marland Kitchen GABAPENTIN 600 MG PO TABS Oral Take 600 mg by mouth 3 (three) times daily.      Marland Kitchen MEDROXYPROGESTERONE ACETATE 150 MG/ML IM SUSP Intramuscular Inject 150 mg into the muscle every 3 (three) months.      Marland Kitchen ZOLPIDEM TARTRATE 10 MG PO TABS Oral Take 10 mg by mouth at bedtime as needed.      Marland Kitchen DEXTROMETHORPHAN-GUAIFENESIN 30-600 MG PO TB12 Oral Take 1 tablet by mouth every 12 (twelve) hours. 14 tablet 0  . KETOROLAC TROMETHAMINE 10 MG PO TABS Oral Take 1 tablet (10 mg total) by mouth every 6 (six) hours as needed for pain. 15 tablet 0    BP 100/70  Pulse 74  Temp(Src) 97.6 F (36.4 C) (Oral)  Resp 16  SpO2 97%  Physical Exam  Nursing note and vitals reviewed. Constitutional: She appears well-developed and well-nourished. No distress.  HENT:  Head: Normocephalic.  Right Ear: External ear normal.  Left Ear: External ear normal.  Nose: Nose normal.  Mouth/Throat: Oropharynx is clear and moist.  Eyes: Conjunctivae are normal.  Pupils are equal, round, and reactive to light.  Neck: Normal range of motion.  Cardiovascular: Normal rate and regular rhythm.   Pulmonary/Chest: Effort normal and breath sounds normal. She has no wheezes.  Neurological: She is alert.  Skin: Skin is warm and dry.    ED Course  Procedures (including critical care time)  Labs Reviewed - No data to display No results found.   1. Chronic pain   2. URI (upper respiratory infection)       MDM          Randa Spike, MD 12/08/10 [phone]

## 2010-12-15 ENCOUNTER — Encounter (HOSPITAL_COMMUNITY): Payer: Self-pay

## 2010-12-15 ENCOUNTER — Emergency Department (HOSPITAL_COMMUNITY)
Admission: EM | Admit: 2010-12-15 | Discharge: 2010-12-15 | Disposition: A | Discharge status: Home / Self Care | Payer: Medicare Other | Attending: Emergency Medicine | Admitting: Emergency Medicine

## 2010-12-15 ENCOUNTER — Emergency Department (HOSPITAL_COMMUNITY): Payer: Medicare Other

## 2010-12-15 DIAGNOSIS — R569 Unspecified convulsions: Secondary | ICD-10-CM | POA: Insufficient documentation

## 2010-12-15 DIAGNOSIS — R200 Anesthesia of skin: Secondary | ICD-10-CM

## 2010-12-15 DIAGNOSIS — E7800 Pure hypercholesterolemia, unspecified: Secondary | ICD-10-CM | POA: Insufficient documentation

## 2010-12-15 DIAGNOSIS — H53149 Visual discomfort, unspecified: Secondary | ICD-10-CM | POA: Insufficient documentation

## 2010-12-15 DIAGNOSIS — R209 Unspecified disturbances of skin sensation: Secondary | ICD-10-CM | POA: Insufficient documentation

## 2010-12-15 DIAGNOSIS — G8929 Other chronic pain: Secondary | ICD-10-CM | POA: Insufficient documentation

## 2010-12-15 DIAGNOSIS — M549 Dorsalgia, unspecified: Secondary | ICD-10-CM | POA: Insufficient documentation

## 2010-12-15 DIAGNOSIS — K219 Gastro-esophageal reflux disease without esophagitis: Secondary | ICD-10-CM | POA: Insufficient documentation

## 2010-12-15 DIAGNOSIS — M542 Cervicalgia: Secondary | ICD-10-CM | POA: Insufficient documentation

## 2010-12-15 DIAGNOSIS — R51 Headache: Secondary | ICD-10-CM | POA: Insufficient documentation

## 2010-12-15 DIAGNOSIS — R110 Nausea: Secondary | ICD-10-CM | POA: Insufficient documentation

## 2010-12-15 LAB — BASIC METABOLIC PANEL
Chloride: 103 mEq/L (ref 96–112)
GFR calc Af Amer: >90 mL/min (ref >90)
GFR calc non Af Amer: >90 mL/min (ref >90)
Potassium: 3.9 mEq/L (ref 3.5–5.1)
Sodium: 136 mEq/L (ref 135–145)

## 2010-12-15 LAB — CBC
HCT: 41.6 % (ref 36.0–46.0)
Hemoglobin: 14.3 g/dL (ref 12.0–15.0)
MCHC: 34.4 g/dL (ref 30.0–36.0)
RBC: 4.43 MIL/uL (ref 3.87–5.11)
WBC: 6.4 10*3/uL (ref 4.0–10.5)

## 2010-12-15 LAB — DIFFERENTIAL
Basophils Relative: 0 % (ref 0–1)
Lymphocytes Relative: 39 % (ref 12–46)
Monocytes Absolute: 0.4 10*3/uL (ref 0.1–1.0)
Monocytes Relative: 6 % (ref 3–12)
Neutro Abs: 3.4 10*3/uL (ref 1.7–7.7)
Neutrophils Relative %: 54 % (ref 43–77)

## 2010-12-15 MED ORDER — SODIUM CHLORIDE 0.9 % IV SOLN: INTRAVENOUS | Status: DC

## 2010-12-15 MED ORDER — DIPHENHYDRAMINE HCL 50 MG/ML IJ SOLN
25.0000 mg | Freq: Once | INTRAMUSCULAR | Status: AC
  Administered 2010-12-15: 25 mg via INTRAVENOUS
  Filled 2010-12-15: qty 1

## 2010-12-15 MED ORDER — FENTANYL CITRATE 0.05 MG/ML IJ SOLN: 50.0000 ug | INTRAMUSCULAR | Status: DC | PRN

## 2010-12-15 MED ORDER — KETOROLAC TROMETHAMINE 30 MG/ML IJ SOLN
30.0000 mg | Freq: Once | INTRAMUSCULAR | Status: AC
  Administered 2010-12-15: 30 mg via INTRAVENOUS
  Filled 2010-12-15: qty 1

## 2010-12-15 MED ORDER — DIAZEPAM 5 MG/ML IJ SOLN
10.0000 mg | Freq: Once | INTRAMUSCULAR | Status: AC
  Administered 2010-12-15: 10 mg via INTRAVENOUS
  Filled 2010-12-15: qty 2

## 2010-12-15 NOTE — ED Provider Notes (Signed)
50yo, F, c/o acute flair chronic neck/back pain and headache that began several days ago.  Also c/o right side of face is "numb" but unclear when this started (husband states last night, pt states this morning) which is new for her.  Has long hx chronic pain with TS spinal stimulator implanted.  Unclear HPI.  Mildy hypotensive on VS.  IVF and Dx testing ordered.  I saw this patient for screening purposes.  Pt is stable, but further workup and evaluation is needed beyond triage capabilities.   Laray Anger, DO 12/15/10 1239

## 2010-12-15 NOTE — ED Notes (Signed)
Pt given gown to put on; husband at bedside

## 2010-12-15 NOTE — ED Notes (Signed)
Pt aware of need of urine specimen; warm blankets given; husband at bedside

## 2010-12-15 NOTE — ED Provider Notes (Signed)
History     CSN: [id number] Arrival date & time: 12/15/2010 11:14 AM   First MD Initiated Contact with Patient 12/15/10 1205      Chief Complaint  Patient presents with  . Headache    (Consider location/radiation/quality/duration/timing/severity/associated sxs/prior treatment) HPI  Patient has chronic pain in her neck and her back. She had a neurostimulator placed by Dr. Shon Baton on September 20. She states she was getting injections in her spine which helped however she states the providers that she doesn't know why. She states she's had chronic headaches since about May or June of this year. States she started getting headaches about 3 days ago including last night and states this morning about 5:30 she started getting a headache which she states actually starts from her back runs up her spine into her neck into her head and says it shoots around in different areas of her head. She also now has a burning frontal discomfort she states it hurts to move her head. Nothing makes it feel better. She states she's had this before but states this is different from her prior migraines. She has nausea without vomiting she has photophobia and noise sensitivity. She states this morning about 10:30 she was sitting in church and noted numbness of the right side of her face. She does not have numbness in her extremities. She states she's having difficulty walking but cannot say why. She denies chest pain or shortness of breath. Husband states she's not dragging her legs but he has to help her to walk. Patient is supposed to be doing another neurostimulator placed by Dr. Shon Baton.  Primary care Dr Melven Sartorius of [facility] Orthopedist Dr Shon Baton  Past Medical History  Diagnosis Date  . Mood disorder   . GERD (gastroesophageal reflux disease)   . Chronic pain   . Back pain    migraine headaches High cholesterol Seizures  Past Surgical History  Procedure Date  . Mood disorder   . Spinal cord  stimulator implant     History reviewed. No pertinent family history.  History  Substance Use Topics  . Smoking status: Current Everyday Smoker  . Smokeless tobacco: Not on file  . Alcohol Use: Yes   lives with spouse Not currently employed  OB History    Grav Para Term Preterm Abortions TAB SAB Ect Mult Living                  Review of Systems  Allergies  Sulfa antibiotics; Ciprofloxacin; Keflex; Reglan; Restoril; and Risperdal  Home Medications   Current Outpatient Rx  Name Route Sig Dispense Refill  . ATORVASTATIN CALCIUM 20 MG PO TABS Oral Take 10 mg by mouth at bedtime.     Marland Kitchen CARBAMAZEPINE 200 MG PO TABS Oral Take 200 mg by mouth 3 (three) times daily.     Marland Kitchen DIAZEPAM 10 MG PO TABS Oral Take 10 mg by mouth 1 day or 1 dose.      Marland Kitchen DICYCLOMINE HCL 20 MG PO TABS Oral Take 20 mg by mouth every 6 (six) hours.      Marland Kitchen GABAPENTIN 300 MG PO CAPS Oral Take 300-600 mg by mouth 4 (four) times daily. 300 mg three times daily and 600 mg at bedtime     . KETOROLAC TROMETHAMINE 10 MG PO TABS Oral Take 10 mg by mouth every 6 (six) hours as needed. For pain     . OXYCODONE-ACETAMINOPHEN 10-650 MG PO TABS Oral Take 1 tablet by mouth every  6 (six) hours as needed. For pain     . PROMETHAZINE HCL 25 MG PO TABS Oral Take 25 mg by mouth every 6 (six) hours as needed. For nausea or vomiting     . MEDROXYPROGESTERONE ACETATE 150 MG/ML IM SUSP Intramuscular Inject 150 mg into the muscle every 3 (three) months.        BP 102/70  Pulse 62  Temp(Src) 98 F (36.7 C) (Oral)  Resp 16  Ht 5\' 2"  (1.575 m)  Wt 135 lb (61.236 kg)  BMI 24.69 kg/m2  SpO2 97%  Vital signs normal  Physical Exam  Vitals reviewed. Constitutional: She is oriented to person, place, and time. She appears well-developed and well-nourished.  Non-toxic appearance. She does not appear ill. No distress.       Patient acts uncomfortable and is holding her neck  HENT:  Head: Normocephalic and atraumatic.  Right Ear:  External ear normal.  Left Ear: External ear normal.  Nose: Nose normal. No mucosal edema or rhinorrhea.  Mouth/Throat: Oropharynx is clear and moist and mucous membranes are normal. No dental abscesses or uvula swelling.  Eyes: Conjunctivae and EOM are normal. Pupils are equal, round, and reactive to light.  Neck: Full passive range of motion without pain.       Patient has diffuse tenderness of the paraspinous muscles of the cervical spine and the thoracic spine and also the lumbar spine with her muscles feeling very firm and tight to palpation. She has pain on any movement of her head in any direction.  Cardiovascular: Normal rate, regular rhythm and normal heart sounds.  Exam reveals no gallop and no friction rub.   No murmur heard. Pulmonary/Chest: Effort normal and breath sounds normal. No respiratory distress. She has no wheezes. She has no rhonchi. She has no rales. She exhibits no tenderness and no crepitus.  Abdominal: Soft. Normal appearance and bowel sounds are normal. She exhibits no distension. There is no tenderness. There is no rebound and no guarding.  Musculoskeletal: Normal range of motion. She exhibits no edema and no tenderness.       Moves all extremities well.   Neurological: She is alert and oriented to person, place, and time. She has normal strength. No cranial nerve deficit.       Patient has no cranial nerve deficit. Of note she does states it hurts her forehead to move her eyebrows and makes her headache worse. She has no pronator drift. She has no weakness in her lower extremities or her grip strength. Her speech is normal without dysarthria.  Skin: Skin is warm, dry and intact. No rash noted. No erythema. No pallor.  Psychiatric: She has a normal mood and affect. Her speech is normal and behavior is normal. Her mood appears not anxious.    ED Course  Procedures (including critical care time)  Patient had been given fentanyl per triage doctor.  Patient given IV  fluids, IV Valium, Benadryl, and Toradol IV. When rechecked at 1600 patients moving her head freely which she could not do before. She states her facial numbness is gone. However she states she still has neck pain and headache however patient appears to be much improved. She is very somnolent and hard to awaken I'm not giving her any more medication at this time. Husband relates she does not sleep at night she sleeps during the day.  Patient has a stimulator in place so she is  not be able to have MRI done. The facial numbness  is resolved however she had pain when she moved the muscles in her face. I do not feel the numbness was a central problem but more of a peripheral problem. She had no motor deficit or any other localizing neurological problem on her exam  Results for orders placed during the hospital encounter of 12/15/10  BASIC METABOLIC PANEL      Component Value Range   Sodium 136  135 - 145 (mEq/L)   Potassium 3.9  3.5 - 5.1 (mEq/L)   Chloride 103  96 - 112 (mEq/L)   CO2 27  19 - 32 (mEq/L)   Glucose, Bld 116 (*) 70 - 99 (mg/dL)   BUN 18  6 - 23 (mg/dL)   Creatinine, Ser 1.61  0.50 - 1.10 (mg/dL)   Calcium 8.6  8.4 - 09.6 (mg/dL)   GFR calc non Af Amer >90  >90 (mL/min)   GFR calc Af Amer >90  >90 (mL/min)  CBC      Component Value Range   WBC 6.4  4.0 - 10.5 (K/uL)   RBC 4.43  3.87 - 5.11 (MIL/uL)   Hemoglobin 14.3  12.0 - 15.0 (g/dL)   HCT 04.5  40.9 - 81.1 (%)   MCV 93.9  78.0 - 100.0 (fL)   MCH 32.3  26.0 - 34.0 (pg)   MCHC 34.4  30.0 - 36.0 (g/dL)   RDW 91.4  78.2 - 95.6 (%)   Platelets 182  150 - 400 (K/uL)  DIFFERENTIAL      Component Value Range   Neutrophils Relative 54  43 - 77 (%)   Neutro Abs 3.4  1.7 - 7.7 (K/uL)   Lymphocytes Relative 39  12 - 46 (%)   Lymphs Abs 2.5  0.7 - 4.0 (K/uL)   Monocytes Relative 6  3 - 12 (%)   Monocytes Absolute 0.4  0.1 - 1.0 (K/uL)   Eosinophils Relative 1  0 - 5 (%)   Eosinophils Absolute 0.1  0.0 - 0.7 (K/uL)   Basophils  Relative 0  0 - 1 (%)   Basophils Absolute 0.0  0.0 - 0.1 (K/uL)    Laboratory interpretation normal  Dg Chest 2 View  12/15/2010  *RADIOLOGY REPORT*  Clinical Data: 50 year old female with headache.  CHEST - 2 VIEW  Comparison: 08/20/2010 prior chest radiographs  Findings: The cardiomediastinal silhouette is unremarkable. Mild peribronchial thickening is noted. A thoracic neurostimulator device is present. There is no evidence of focal airspace disease, pulmonary edema, pulmonary nodule/mass, pleural effusion, or pneumothorax. No acute bony abnormalities are identified.  IMPRESSION: No evidence of acute cardiopulmonary disease.  Thoracic neurostimulator.  Original Report Authenticated By: Rosendo Gros, M.D.   Ct Head Wo Contrast  12/15/2010  *RADIOLOGY REPORT*  Clinical Data: Headache and numbness.  CT HEAD WITHOUT CONTRAST  Technique:  Contiguous axial images were obtained from the base of the skull through the vertex without contrast.  Comparison: 09/27/2010.  Findings: No evidence of acute infarct, acute hemorrhage, mass lesion, mass effect or hydrocephalus.  Visualized portions of paranasal sinuses and mastoid air cells are clear.  IMPRESSION: No acute findings.  Original Report Authenticated By: Reyes Ivan, M.D.       1. Neck pain, chronic   2. Chronic headache   3. Right facial numbness resolved    Plan discharge  Devoria Albe, MD, FACEP   MDM          Ward Givens, MD 12/15/10 [phone]

## 2010-12-15 NOTE — ED Notes (Signed)
Pt. Stated, I started having HA May and I go to the pain clinic and the D stated, its from my spine

## 2010-12-22 ENCOUNTER — Emergency Department (HOSPITAL_COMMUNITY)
Admission: EM | Admit: 2010-12-22 | Discharge: 2010-12-22 | Disposition: A | Discharge status: Home / Self Care | Payer: Medicare Other

## 2010-12-23 IMAGING — US US ABDOMEN COMPLETE
1 series · 14 of 25 positions shown · non-contrast
Comparison: None

CLINICAL DATA: Severe right upper quadrant abdominal pain.
Nausea.  Diarrhea.

ABDOMINAL ULTRASOUND
TECHNIQUE: Abdominal ultrasound examination was performed
including evaluation of the liver, gallbladder, bile ducts,
pancreas, kidneys, spleen, IVC, and abdominal aorta.

[Series 1: us abdomen complete · 0.37mm/px · 14 of 79 slices shown]
[im 1/79]
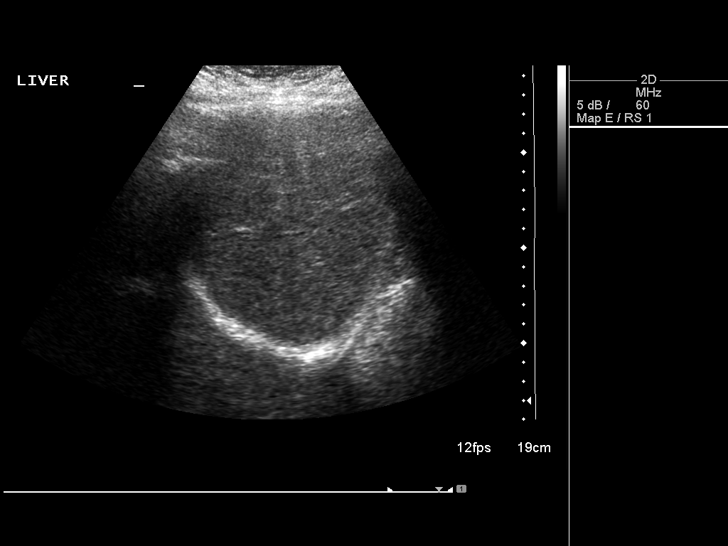
[im 7/79]
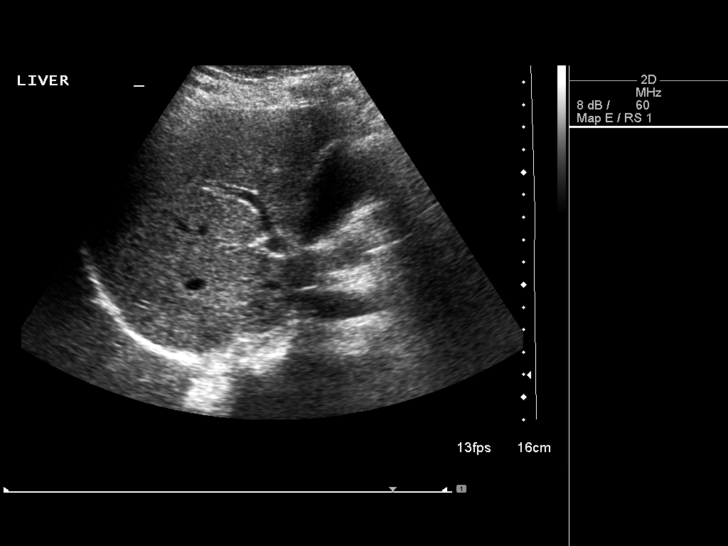
[im 14/79]
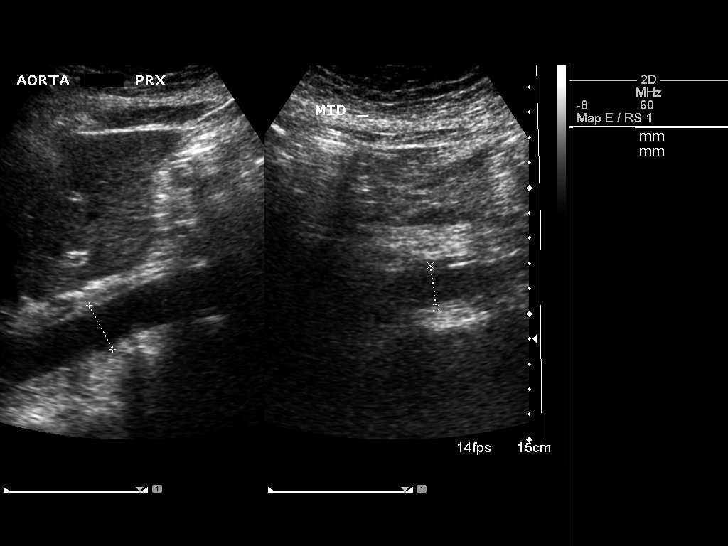
[im 20/79]
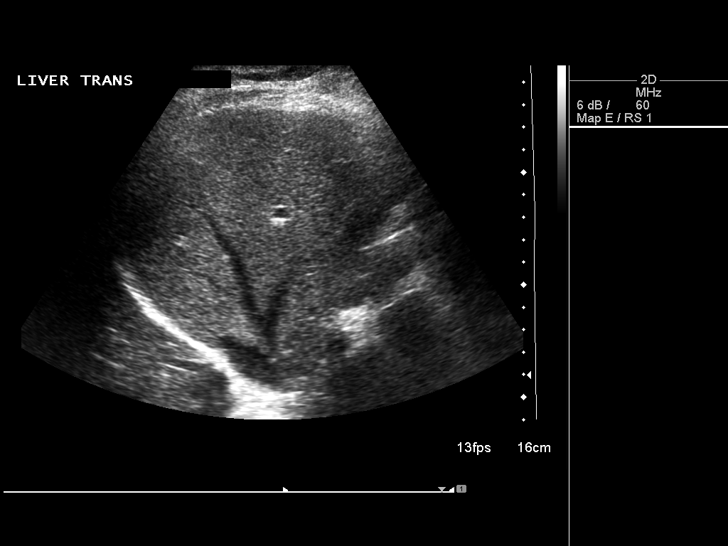
[im 27/79]
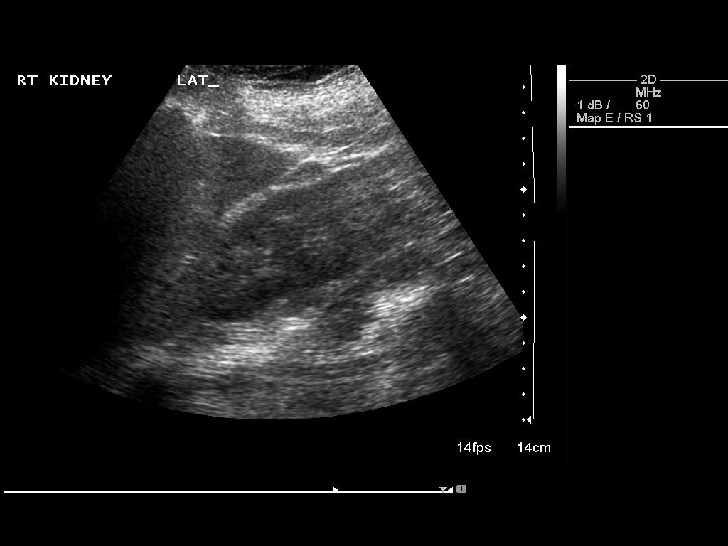
[im 30/79]
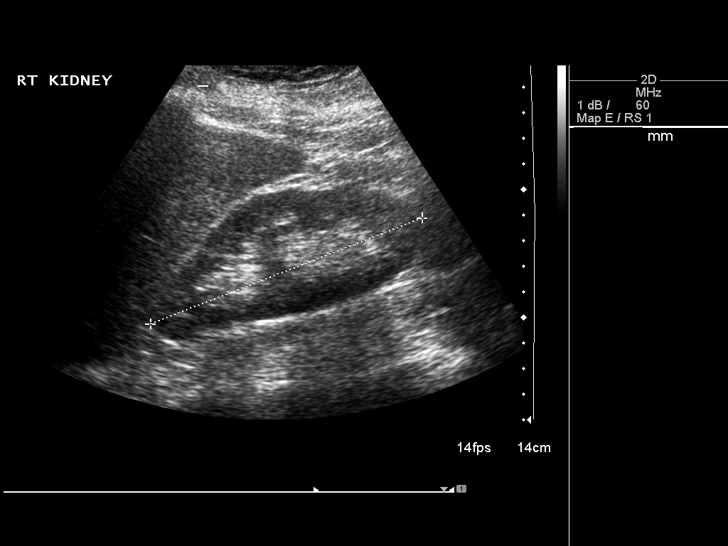
[im 36/79]
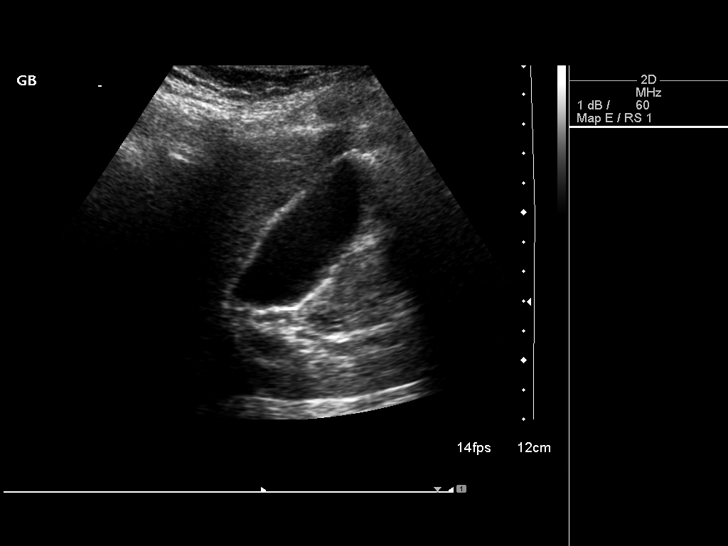
[im 43/79]
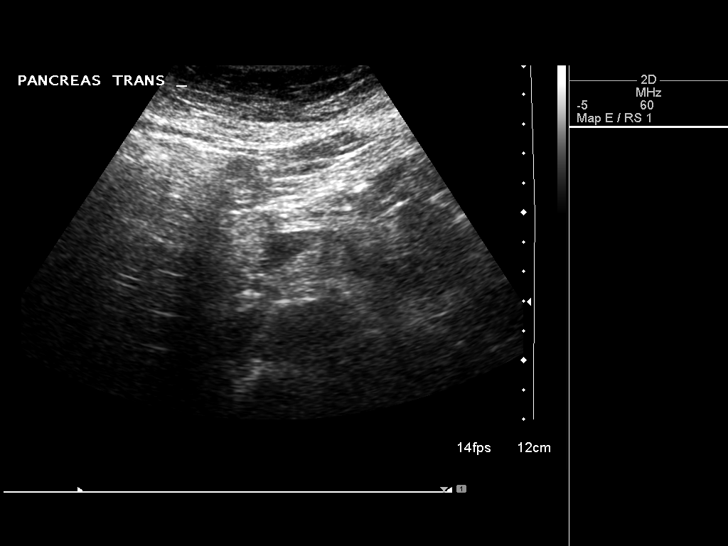
[im 49/79]
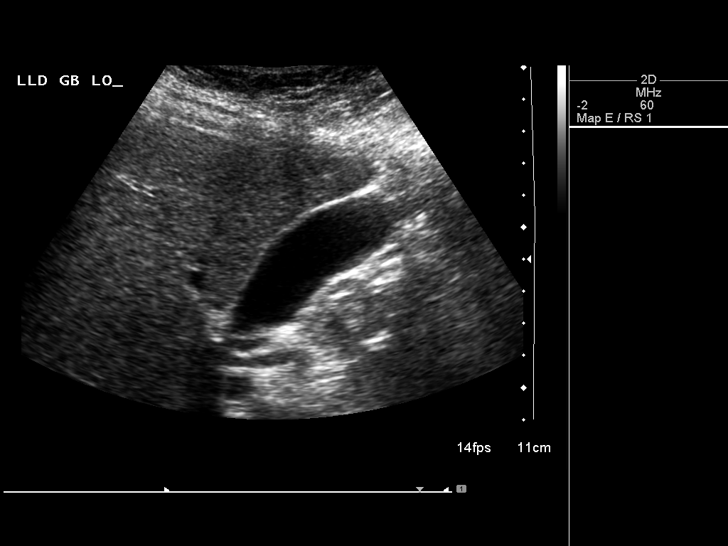
[im 53/79]
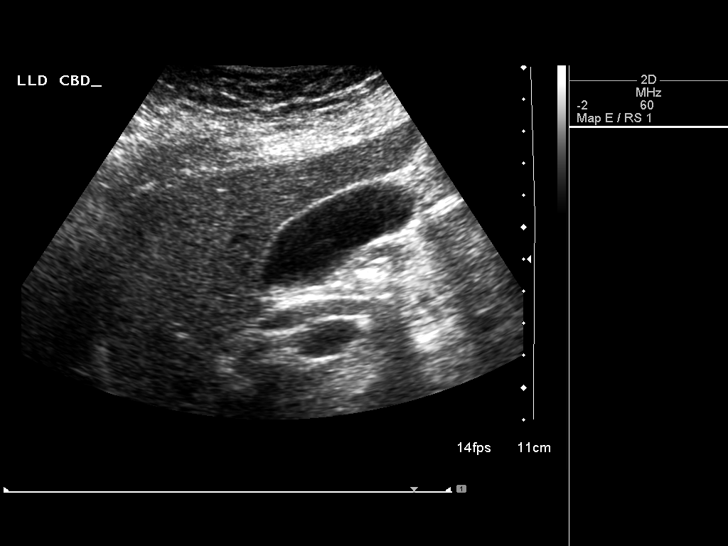
[im 59/79]
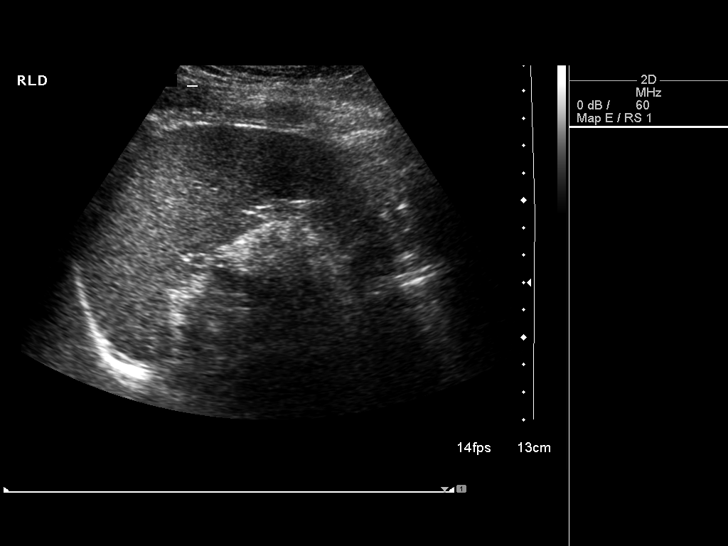
[im 66/79]
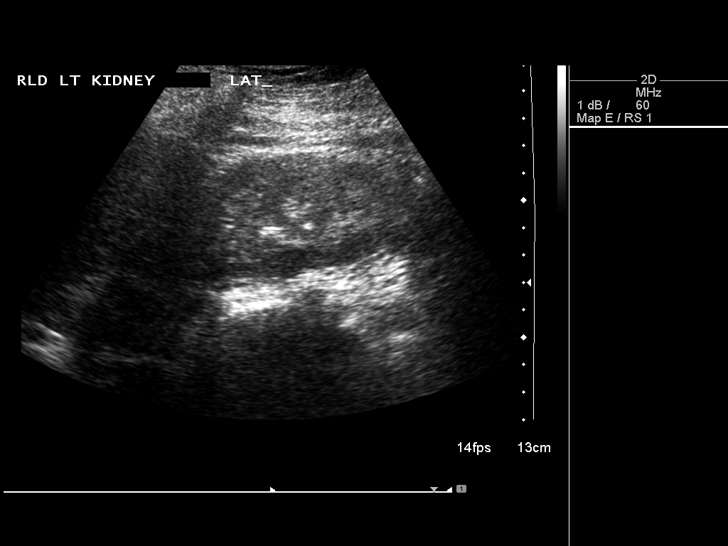
[im 72/79]
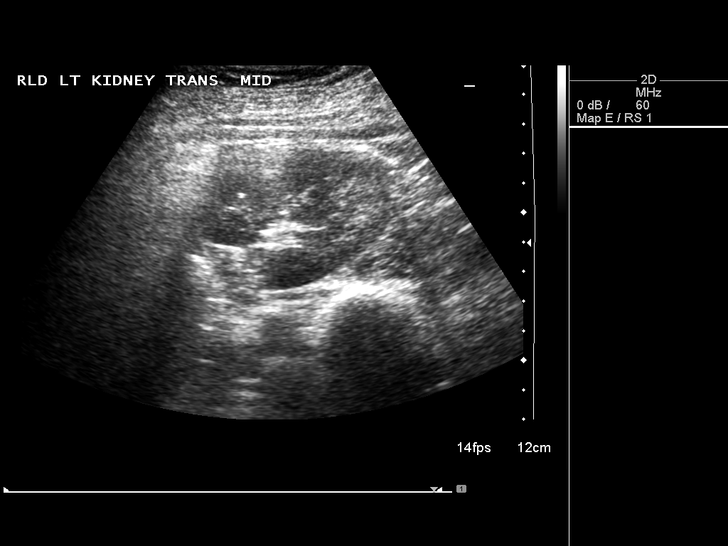
[im 79/79]
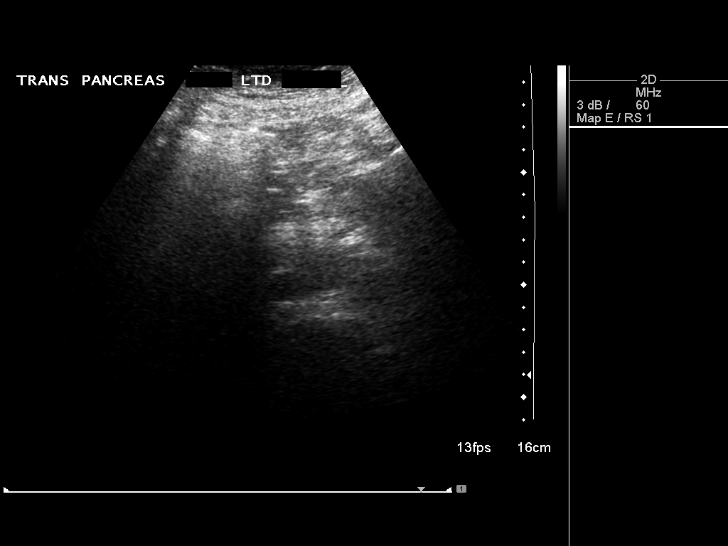

[14 of 25 positions shown; findings below may reference images not displayed]

FINDINGS: Gallbladder:  No gallstones, gallbladder wall thickening, or
pericholecystic fluid.

Common bile duct: Within normal limits in caliber.

Liver:  No focal parenchymal abnormalities.  Within normal limits
in parenchymal echogenicity.

Inferior vena cava:  Visualized portion unremarkable.

Pancreas:  Not well visualized due to overlying bowel gas, but no
abnormality noted.

Spleen:  Within normal limits in size and echogenicity.

Right kidney:  Within normal limits in size and echogenicity. No
evidence of mass or hydronephrosis.

Left kidney:  Within normal limits in size and echogenicity. No
evidence of mass or hydronephrosis.

Abdominal aorta:  Within normal limits in caliber.
IMPRESSION: Negative abdominal ultrasound. No evidence of gallstones or other
acute findings.

## 2010-12-26 ENCOUNTER — Encounter (HOSPITAL_COMMUNITY): Payer: Self-pay | Admitting: Emergency Medicine

## 2010-12-26 ENCOUNTER — Encounter (HOSPITAL_COMMUNITY): Payer: Self-pay | Admitting: *Deleted

## 2010-12-26 ENCOUNTER — Emergency Department (INDEPENDENT_AMBULATORY_CARE_PROVIDER_SITE_OTHER)
Admission: EM | Admit: 2010-12-26 | Discharge: 2010-12-26 | Disposition: A | Discharge status: Nursing Home (Includes ICF) | Payer: Medicare Other | Source: Home / Self Care | Attending: Family Medicine | Admitting: Family Medicine

## 2010-12-26 ENCOUNTER — Emergency Department (HOSPITAL_COMMUNITY)
Admission: EM | Admit: 2010-12-26 | Discharge: 2010-12-26 | Disposition: A | Discharge status: Home / Self Care | Payer: Medicare Other | Attending: Emergency Medicine | Admitting: Emergency Medicine

## 2010-12-26 DIAGNOSIS — G894 Chronic pain syndrome: Secondary | ICD-10-CM

## 2010-12-26 DIAGNOSIS — M549 Dorsalgia, unspecified: Secondary | ICD-10-CM | POA: Insufficient documentation

## 2010-12-26 DIAGNOSIS — Z79899 Other long term (current) drug therapy: Secondary | ICD-10-CM | POA: Insufficient documentation

## 2010-12-26 DIAGNOSIS — H53149 Visual discomfort, unspecified: Secondary | ICD-10-CM | POA: Insufficient documentation

## 2010-12-26 DIAGNOSIS — K219 Gastro-esophageal reflux disease without esophagitis: Secondary | ICD-10-CM | POA: Insufficient documentation

## 2010-12-26 DIAGNOSIS — G8929 Other chronic pain: Secondary | ICD-10-CM | POA: Insufficient documentation

## 2010-12-26 DIAGNOSIS — R51 Headache: Secondary | ICD-10-CM | POA: Insufficient documentation

## 2010-12-26 DIAGNOSIS — IMO0001 Reserved for inherently not codable concepts without codable children: Secondary | ICD-10-CM | POA: Insufficient documentation

## 2010-12-26 DIAGNOSIS — R5381 Other malaise: Secondary | ICD-10-CM | POA: Insufficient documentation

## 2010-12-26 DIAGNOSIS — F411 Generalized anxiety disorder: Secondary | ICD-10-CM | POA: Insufficient documentation

## 2010-12-26 DIAGNOSIS — M542 Cervicalgia: Secondary | ICD-10-CM | POA: Insufficient documentation

## 2010-12-26 DIAGNOSIS — R209 Unspecified disturbances of skin sensation: Secondary | ICD-10-CM | POA: Insufficient documentation

## 2010-12-26 MED ORDER — KETOROLAC TROMETHAMINE 30 MG/ML IJ SOLN
30.0000 mg | Freq: Once | INTRAMUSCULAR | Status: AC
  Administered 2010-12-26: 30 mg via INTRAVENOUS
  Filled 2010-12-26: qty 1

## 2010-12-26 MED ORDER — METOCLOPRAMIDE HCL 5 MG/ML IJ SOLN
10.0000 mg | Freq: Once | INTRAMUSCULAR | Status: DC
  Filled 2010-12-26: qty 2

## 2010-12-26 MED ORDER — HYDROMORPHONE HCL PF 2 MG/ML IJ SOLN
2.0000 mg | Freq: Once | INTRAMUSCULAR | Status: AC
  Administered 2010-12-26: 2 mg via INTRAMUSCULAR
  Filled 2010-12-26 (×2): qty 1

## 2010-12-26 MED ORDER — DIPHENHYDRAMINE HCL 50 MG/ML IJ SOLN
25.0000 mg | Freq: Once | INTRAMUSCULAR | Status: AC
  Administered 2010-12-26: 25 mg via INTRAVENOUS
  Filled 2010-12-26: qty 1

## 2010-12-26 NOTE — ED Provider Notes (Deleted)
Medical screening examination/treatment/procedure(s) were performed by non-physician practitioner and as supervising physician I was immediately available for consultation/collaboration.   Kierstin January L Ruta Capece, MD 12/26/10 2026 

## 2010-12-26 NOTE — ED Notes (Signed)
Pt states that this am she started having severe headache. States that she was at urgent care and was sent here for further eval. Pt states she has had recurrent migraines. Pt reports photophobia. Denies any nausea. Pt is alert and oriented x 3 neuro intact. Pt reports her entire face feels tingling. No signs or symptoms of stroke.

## 2010-12-26 NOTE — ED Provider Notes (Signed)
History     CSN: [id number] Arrival date & time: 12/26/2010  5:42 PM   First MD Initiated Contact with Patient 12/26/10 1828      Chief Complaint  Patient presents with  . Headache    (Consider location/radiation/quality/duration/timing/severity/associated sxs/prior treatment) HPI Comments: Patient presented at the urgent care today for severe headache.  She was sent here for further evaluation of this headache.  Of note patient was evaluated last week by Dr. Lynelle Doctor for headache.  A CT was performed at that time with no acute findings.  MRI cannot be performed due to patient having nerve stimulator.  Patient was seen by Dr. Shon Baton yesterday for possible implantation of an additional stimulator, however he decided not to do the surgery and have the patient followup with the [facility] or as well as begins physical therapy. Pt is also being treated by Dr. Retia Passe, orthopedic back and spine.  Patient is a 50 y.o. female presenting with headaches. The history is provided by the patient.  Headache  Pertinent negatives include no nausea.    Past Medical History  Diagnosis Date  . Mood disorder   . GERD (gastroesophageal reflux disease)   . Chronic pain   . Back pain     Past Surgical History  Procedure Date  . Mood disorder   . Spinal cord stimulator implant   . Abdominal hysterectomy     History reviewed. No pertinent family history.  History  Substance Use Topics  . Smoking status: Current Everyday Smoker  . Smokeless tobacco: Not on file  . Alcohol Use: Yes    OB History    Grav Para Term Preterm Abortions TAB SAB Ect Mult Living                  Review of Systems  HENT: Positive for neck pain. Negative for neck stiffness.   Eyes: Positive for photophobia. Negative for visual disturbance.  Gastrointestinal: Negative for nausea and abdominal pain.  Musculoskeletal: Positive for back pain. Negative for gait problem.  Neurological: Positive for  weakness, numbness and headaches. Negative for dizziness, seizures, syncope, facial asymmetry, speech difficulty and light-headedness.  Psychiatric/Behavioral: Negative for confusion.    Allergies  Sulfa antibiotics; Ciprofloxacin; Keflex; Reglan; Restoril; and Risperdal  Home Medications   Current Outpatient Rx  Name Route Sig Dispense Refill  . ATORVASTATIN CALCIUM 20 MG PO TABS Oral Take 10 mg by mouth at bedtime.     Marland Kitchen CARBAMAZEPINE 200 MG PO TABS Oral Take 200 mg by mouth 3 (three) times daily.     . CHLORPROMAZINE HCL 25 MG PO TABS Oral Take 25 mg by mouth 3 (three) times daily.      Marland Kitchen DIAZEPAM 10 MG PO TABS Oral Take 10 mg by mouth daily as needed. For anxiety    . GABAPENTIN 300 MG PO CAPS Oral Take 300-600 mg by mouth 4 (four) times daily. 300 mg three times daily and 600 mg at bedtime    . KETOROLAC TROMETHAMINE 10 MG PO TABS Oral Take 10 mg by mouth every 6 (six) hours as needed. For pain     . MEDROXYPROGESTERONE ACETATE 150 MG/ML IM SUSP Intramuscular Inject 150 mg into the muscle every 3 (three) months.      . OXYCODONE-ACETAMINOPHEN 10-650 MG PO TABS Oral Take 1 tablet by mouth every 6 (six) hours as needed. For pain     . PROMETHAZINE HCL 25 MG PO TABS Oral Take 25 mg by mouth every 6 (  six) hours as needed. For nausea or vomiting     . TIZANIDINE HCL 4 MG PO CAPS Oral Take 4 mg by mouth 3 (three) times daily.      Marland Kitchen ZOLMITRIPTAN 5 MG PO TABS Oral Take 5 mg by mouth daily as needed. For migraine      BP 104/70  Pulse 69  Temp(Src) 98 F (36.7 C) (Oral)  Resp 20  SpO2 97%  Physical Exam  Nursing note and vitals reviewed. Constitutional: She is oriented to person, place, and time. She appears well-developed and well-nourished. No distress.  HENT:  Head: Normocephalic and atraumatic.  Eyes:       Normal appearance  Neck: Normal range of motion. Neck supple. Muscular tenderness present. No spinous process tenderness present. No rigidity. Normal range of motion  present. No Brudzinski's sign and no Kernig's sign noted.  Cardiovascular: Normal rate, regular rhythm and intact distal pulses.   Pulmonary/Chest: Effort normal and breath sounds normal.  Musculoskeletal: Normal range of motion.  Neurological: She is alert and oriented to person, place, and time. She has normal reflexes. She displays no tremor. No cranial nerve deficit or sensory deficit. She displays a negative Romberg sign. Coordination and gait normal. GCS eye subscore is 4. GCS verbal subscore is 5. GCS motor subscore is 6.       5/5 and equal upper and lower extremity strength.  No pronator drift.  No nystagmus.   Skin: Skin is warm and dry. No rash noted.  Psychiatric: Her mood appears anxious. Her affect is inappropriate.    ED Course  Procedures (including critical care time)  Labs Reviewed - No data to display No results found.   No diagnosis found.     MDM  Chronic Neck, Back pain  Chronic Headaches   Pt evaluated by both myself & Dr. Estell Harpin. She is a chronic pain management pt with no signs of meningitis & full work up by Dr. Lynelle Doctor last week. Pt unable to receive MRI d/t back simulator. Discussed need to follow up with [facility] and chronic pain mangt. Dr. Estell Harpin agrees with my plan to manage pts pain here in the ED & discharge with out further work up        [place], [place] 12/26/10 2012

## 2010-12-26 NOTE — ED Notes (Signed)
PT HERE WITH C/O SEVERE H/A THAT STARTS IN SPINE AND SHOOTS CONSTANT PAIN FOR MNTHS BUT HAS WORSENED IN THE LAST WEEK WITH NUMBNESS TO LEFT FACE RADIATING DOWN TO CHIN AREA BUT PT POINTS TO RIGHT SIDE OF FACE??SX NUMBNESS STARTED Sunday WHEN PT WENT TO [place] AND WAS GIVEN MUSCLE RELAXERS BUT STATES NO RELIEF IN PAIN .

## 2010-12-26 NOTE — ED Provider Notes (Signed)
History     CSN: [id number] Arrival date & time: 12/26/2010  3:15 PM   First MD Initiated Contact with Patient 12/26/10 1511      Chief Complaint  Patient presents with  . Headache  . Numbness    (Consider location/radiation/quality/duration/timing/severity/associated sxs/prior treatment) Patient is a 50 y.o. female presenting with headaches. The history is provided by the patient and the spouse.  Headache The primary symptoms include headaches and paresthesias. The symptoms began more than 1 week ago. The symptoms are worsening.  The headache is associated with photophobia and paresthesias. The headache is not associated with weakness.  Additional symptoms include pain, photophobia, hyperacusis and dysphoric mood. Additional symptoms do not include weakness. Associated medical issues comments: complicated medical hx, --chronic pain synd with nerve stim, seen by lmd yest, given zanaflex, no relief.. Procedure history comments: extensive..    Past Medical History  Diagnosis Date  . Mood disorder   . GERD (gastroesophageal reflux disease)   . Chronic pain   . Back pain     Past Surgical History  Procedure Date  . Mood disorder   . Spinal cord stimulator implant     No family history on file.  History  Substance Use Topics  . Smoking status: Current Everyday Smoker  . Smokeless tobacco: Not on file  . Alcohol Use: Yes    OB History    Grav Para Term Preterm Abortions TAB SAB Ect Mult Living                  Review of Systems  Constitutional: Negative.   HENT: Positive for neck pain.   Eyes: Positive for photophobia.  Neurological: Positive for numbness, headaches and paresthesias. Negative for tremors, syncope, facial asymmetry and weakness.  Psychiatric/Behavioral: Positive for dysphoric mood.    Allergies  Sulfa antibiotics; Ciprofloxacin; Keflex; Reglan; Restoril; and Risperdal  Home Medications   Current Outpatient Rx  Name Route Sig Dispense Refill    . TIZANIDINE HCL 4 MG PO CAPS Oral Take 4 mg by mouth 3 (three) times daily.      Marland Kitchen ZOLMITRIPTAN 5 MG PO TABS Oral Take 5 mg by mouth as needed.      . ATORVASTATIN CALCIUM 20 MG PO TABS Oral Take 10 mg by mouth at bedtime.     Marland Kitchen CARBAMAZEPINE 200 MG PO TABS Oral Take 200 mg by mouth 3 (three) times daily.     Marland Kitchen DIAZEPAM 10 MG PO TABS Oral Take 10 mg by mouth 1 day or 1 dose.      Marland Kitchen DICYCLOMINE HCL 20 MG PO TABS Oral Take 20 mg by mouth every 6 (six) hours.      Marland Kitchen GABAPENTIN 300 MG PO CAPS Oral Take 300-600 mg by mouth 4 (four) times daily. 300 mg three times daily and 600 mg at bedtime     . KETOROLAC TROMETHAMINE 10 MG PO TABS Oral Take 10 mg by mouth every 6 (six) hours as needed. For pain     . MEDROXYPROGESTERONE ACETATE 150 MG/ML IM SUSP Intramuscular Inject 150 mg into the muscle every 3 (three) months.      . OXYCODONE-ACETAMINOPHEN 10-650 MG PO TABS Oral Take 1 tablet by mouth every 6 (six) hours as needed. For pain     . PROMETHAZINE HCL 25 MG PO TABS Oral Take 25 mg by mouth every 6 (six) hours as needed. For nausea or vomiting       BP 123/79  Pulse 77  Temp(Src) 98.1  F (36.7 C) (Oral)  Resp 16  SpO2 98%  Physical Exam  Nursing note and vitals reviewed. Constitutional: She is oriented to person, place, and time. She appears well-developed and well-nourished. She appears distressed.  HENT:  Head: Normocephalic.  Right Ear: External ear normal.  Left Ear: External ear normal.  Mouth/Throat: Oropharynx is clear and moist.  Eyes: Conjunctivae and EOM are normal. Pupils are equal, round, and reactive to light.  Neck: Normal range of motion. Neck supple.  Neurological: She is alert and oriented to person, place, and time. No cranial nerve deficit. Coordination normal.  Psychiatric: Her mood appears anxious. Her affect is inappropriate. Her speech is tangential. She is agitated.    ED Course  Procedures (including critical care time)  Labs Reviewed - No data to  display No results found.   No diagnosis found.    MDM          Barkley Bruns, MD 12/26/10 [phone]

## 2010-12-26 NOTE — ED Notes (Addendum)
Pt is stating numbness of the right side face and arm. The numbness comes and goes. Pt is speaking full sentences well. Pt is oriented x3 and Pt chose to set in car to wait to be called.  I returned medication list to Pt at their car. Pt seen smoking in car. If Pt begins to fine worst, they are advise to let the front desk know.

## 2010-12-26 NOTE — ED Provider Notes (Signed)
Medical screening examination/treatment/procedure(s) were conducted as a shared visit with non-physician practitioner(s) and myself.  I personally evaluated the patient during the encounter Pt with headache.  pe heent neg neuro normal  Benny Lennert, MD 12/26/10 2033

## 2010-12-27 ENCOUNTER — Ambulatory Visit: Payer: Medicare Other | Attending: Rehabilitation | Admitting: Physical Therapy

## 2010-12-27 DIAGNOSIS — M542 Cervicalgia: Secondary | ICD-10-CM | POA: Insufficient documentation

## 2010-12-27 DIAGNOSIS — IMO0001 Reserved for inherently not codable concepts without codable children: Secondary | ICD-10-CM | POA: Insufficient documentation

## 2010-12-31 ENCOUNTER — Ambulatory Visit: Payer: Medicare Other | Admitting: Physical Therapy

## 2011-01-02 ENCOUNTER — Ambulatory Visit: Payer: Medicare Other | Admitting: Physical Therapy

## 2011-01-06 ENCOUNTER — Ambulatory Visit: Payer: Medicare Other | Admitting: Physical Therapy

## 2011-01-08 ENCOUNTER — Ambulatory Visit: Payer: Medicare Other | Admitting: Physical Therapy

## 2011-01-16 ENCOUNTER — Ambulatory Visit: Payer: Medicare Other | Admitting: Physical Therapy

## 2011-01-20 ENCOUNTER — Ambulatory Visit: Payer: Medicare Other | Admitting: Physical Therapy

## 2011-01-22 ENCOUNTER — Ambulatory Visit: Payer: Medicare Other | Admitting: Physical Therapy

## 2011-02-07 ENCOUNTER — Emergency Department (HOSPITAL_COMMUNITY): Payer: Medicare Other

## 2011-02-07 ENCOUNTER — Emergency Department (HOSPITAL_COMMUNITY)
Admission: EM | Admit: 2011-02-07 | Discharge: 2011-02-07 | Disposition: A | Discharge status: Home / Self Care | Payer: Medicare Other | Attending: Emergency Medicine | Admitting: Emergency Medicine

## 2011-02-07 ENCOUNTER — Encounter (HOSPITAL_COMMUNITY): Payer: Self-pay | Admitting: *Deleted

## 2011-02-07 DIAGNOSIS — M79609 Pain in unspecified limb: Secondary | ICD-10-CM | POA: Insufficient documentation

## 2011-02-07 DIAGNOSIS — F17200 Nicotine dependence, unspecified, uncomplicated: Secondary | ICD-10-CM | POA: Insufficient documentation

## 2011-02-07 DIAGNOSIS — Z79899 Other long term (current) drug therapy: Secondary | ICD-10-CM | POA: Insufficient documentation

## 2011-02-07 DIAGNOSIS — S6000XA Contusion of unspecified finger without damage to nail, initial encounter: Secondary | ICD-10-CM | POA: Insufficient documentation

## 2011-02-07 DIAGNOSIS — K219 Gastro-esophageal reflux disease without esophagitis: Secondary | ICD-10-CM | POA: Insufficient documentation

## 2011-02-07 DIAGNOSIS — M79641 Pain in right hand: Secondary | ICD-10-CM

## 2011-02-07 DIAGNOSIS — M25539 Pain in unspecified wrist: Secondary | ICD-10-CM | POA: Insufficient documentation

## 2011-02-07 MED ORDER — DIAZEPAM 5 MG PO TABS: 5.0000 mg | ORAL_TABLET | Freq: Once | ORAL | Status: DC

## 2011-02-07 MED ORDER — IBUPROFEN 200 MG PO TABS
400.0000 mg | ORAL_TABLET | Freq: Once | ORAL | Status: AC
  Administered 2011-02-07: 400 mg via ORAL
  Filled 2011-02-07: qty 2
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
## 2011-02-07 NOTE — Progress Notes (Signed)
Orthopedic Tech Progress Note Patient Details:  Rebecca [name] [birth year] [id number]  Type of Splint: Thumb velcro Splint Location: right hand Splint Interventions: Application    Nikki Dom 02/07/2011, 5:27 PM

## 2011-03-21 ENCOUNTER — Telehealth: Payer: Self-pay | Admitting: *Deleted

## 2011-03-21 MED ORDER — MEDROXYPROGESTERONE ACETATE 150 MG/ML IM SUSP: 150.0000 mg | INTRAMUSCULAR | Status: DC

## 2011-03-21 NOTE — Telephone Encounter (Signed)
Please inform patient that she should administer a full dose of Depo-Provera 150 mg IM to make sure that she is covered for contraception.

## 2011-03-21 NOTE — Telephone Encounter (Signed)
Pt informed with the below note, rx sent [facility] with no refills pt annual is due this month and will need OV to get additional refills.

## 2011-03-21 NOTE — Telephone Encounter (Signed)
Pt called stating she gives herself self injections of her depro-provera 150 mg IM. Pt said that she tried to inject herself and the needle came out of her skin. Pt said that she didn't get her full injection of the shot, she wants to know if she should give herself another injection? Please advise.

## 2011-03-25 ENCOUNTER — Encounter (HOSPITAL_COMMUNITY): Payer: Self-pay | Admitting: Emergency Medicine

## 2011-03-25 ENCOUNTER — Emergency Department (HOSPITAL_COMMUNITY)
Admission: EM | Admit: 2011-03-25 | Discharge: 2011-03-25 | Disposition: A | Discharge status: Home / Self Care | Payer: Medicare Other | Attending: Emergency Medicine | Admitting: Emergency Medicine

## 2011-03-25 ENCOUNTER — Emergency Department (HOSPITAL_COMMUNITY): Payer: Medicare Other

## 2011-03-25 DIAGNOSIS — M79609 Pain in unspecified limb: Secondary | ICD-10-CM | POA: Insufficient documentation

## 2011-03-25 DIAGNOSIS — F39 Unspecified mood [affective] disorder: Secondary | ICD-10-CM | POA: Insufficient documentation

## 2011-03-25 DIAGNOSIS — IMO0001 Reserved for inherently not codable concepts without codable children: Secondary | ICD-10-CM | POA: Insufficient documentation

## 2011-03-25 DIAGNOSIS — G8929 Other chronic pain: Secondary | ICD-10-CM | POA: Insufficient documentation

## 2011-03-25 DIAGNOSIS — M7918 Myalgia, other site: Secondary | ICD-10-CM

## 2011-03-25 DIAGNOSIS — F17200 Nicotine dependence, unspecified, uncomplicated: Secondary | ICD-10-CM | POA: Insufficient documentation

## 2011-03-25 DIAGNOSIS — E119 Type 2 diabetes mellitus without complications: Secondary | ICD-10-CM | POA: Insufficient documentation

## 2011-03-25 LAB — CBC
HCT: 42.2 % (ref 36.0–46.0)
Hemoglobin: 14.8 g/dL (ref 12.0–15.0)
MCH: 33.3 pg (ref 26.0–34.0)
MCHC: 35.1 g/dL (ref 30.0–36.0)
RBC: 4.45 MIL/uL (ref 3.87–5.11)

## 2011-03-25 LAB — DIFFERENTIAL
Eosinophils Absolute: 0.2 10*3/uL (ref 0.0–0.7)
Lymphs Abs: 2.3 10*3/uL (ref 0.7–4.0)
Monocytes Absolute: 0.5 10*3/uL (ref 0.1–1.0)
Monocytes Relative: 7 % (ref 3–12)
Neutro Abs: 4.4 10*3/uL (ref 1.7–7.7)
Neutrophils Relative %: 59 % (ref 43–77)

## 2011-03-25 LAB — BASIC METABOLIC PANEL
BUN: 12 mg/dL (ref 6–23)
Chloride: 110 mEq/L (ref 96–112)
Creatinine, Ser: 0.65 mg/dL (ref 0.50–1.10)
Glucose, Bld: 99 mg/dL (ref 70–99)
Potassium: 4 mEq/L (ref 3.5–5.1)

## 2011-03-25 MED ORDER — HYDROMORPHONE HCL PF 1 MG/ML IJ SOLN
1.0000 mg | Freq: Once | INTRAMUSCULAR | Status: AC
  Administered 2011-03-25: 1 mg via INTRAVENOUS
  Filled 2011-03-25: qty 1

## 2011-03-25 MED ORDER — OXYCODONE-ACETAMINOPHEN 5-325 MG PO TABS: 1.0000 | ORAL_TABLET | ORAL | Status: DC | PRN

## 2011-03-25 MED ORDER — SODIUM CHLORIDE 0.9 % IV SOLN
Freq: Once | INTRAVENOUS | Status: AC
  Administered 2011-03-25: 08:00:00 via INTRAVENOUS

## 2011-03-25 MED ORDER — ONDANSETRON HCL 4 MG/2ML IJ SOLN
4.0000 mg | Freq: Once | INTRAMUSCULAR | Status: AC
  Administered 2011-03-25: 4 mg via INTRAVENOUS
  Filled 2011-03-25: qty 2

## 2011-03-25 MED ORDER — HYDROMORPHONE HCL PF 2 MG/ML IJ SOLN
2.0000 mg | Freq: Once | INTRAMUSCULAR | Status: AC
  Administered 2011-03-25: 2 mg via INTRAVENOUS
  Filled 2011-03-25: qty 1

## 2011-03-25 NOTE — ED Notes (Signed)
Report received from [facility], RN

## 2011-03-25 NOTE — ED Provider Notes (Signed)
History     CSN: [id number]  Arrival date & time 03/25/11  8413   First MD Initiated Contact with Patient 03/25/11 0720     Chief Complaint  Patient presents with  . Leg Pain    Patient is a 51 y.o. female presenting with leg pain. The history is provided by the patient.  Leg Pain  The incident occurred yesterday. There was no injury mechanism. Pain location: right buttock. The quality of the pain is described as burning. The pain is severe. The pain has been constant since onset. Pertinent negatives include no inability to bear weight, no loss of motion and no muscle weakness. The symptoms are aggravated by activity and bearing weight. She has tried immobilization for the symptoms. The treatment provided no relief.  the pain radiates to her right knee  She reports right buttock pain that radiates to right knee since last night The pain started gradually It is worsening No falls No fever/abd pain Reports only mild back pain No focal leg weakness No urinary/fecal incontinence Reports h/o nerve stimulator.  Saw her orthopedist last week for "injection" in her back for an unrelated complaint   Past Medical History  Diagnosis Date  . Mood disorder   . GERD (gastroesophageal reflux disease)   . Chronic pain   . Back pain   . Diabetes mellitus     Past Surgical History  Procedure Date  . Mood disorder   . Spinal cord stimulator implant   . Abdominal hysterectomy   . Back surgery   . Carpal tunnel release     History reviewed. No pertinent family history.  History  Substance Use Topics  . Smoking status: Current Everyday Smoker  . Smokeless tobacco: Not on file  . Alcohol Use: Yes    OB History    Grav Para Term Preterm Abortions TAB SAB Ect Mult Living                  Review of Systems  All other systems reviewed and are negative.    Allergies  Sulfa antibiotics; Ciprofloxacin; Keflex; Reglan; Restoril; and Risperdal  Home Medications   Current  Outpatient Rx  Name Route Sig Dispense Refill  . ATORVASTATIN CALCIUM 10 MG PO TABS Oral Take 5 mg by mouth at bedtime.    Marland Kitchen DIAZEPAM 10 MG PO TABS Oral Take 10 mg by mouth daily.     Marland Kitchen DIPHENHYDRAMINE-APAP (SLEEP) 25-500 MG PO TABS Oral Take 1 tablet by mouth at bedtime as needed. For sleep    . GABAPENTIN 300 MG PO CAPS Oral Take 600-900 mg by mouth 4 (four) times daily. 1 capsule threes times daily  2 capsules at bedtime    . IBUPROFEN 200 MG PO TABS Oral Take 200 mg by mouth every 6 (six) hours as needed. For pain    . MEDROXYPROGESTERONE ACETATE 150 MG/ML IM SUSP Intramuscular Inject 1 mL (150 mg total) into the muscle every 3 (three) months. 1 mL 0    BP 107/57  Pulse 90  Temp(Src) 98.3 F (36.8 C) (Oral)  Resp 20  SpO2 98%  Physical Exam CONSTITUTIONAL: Well developed/well nourished, uncomfortable appearing HEAD AND FACE: Normocephalic/atraumatic EYES: EOMI/PERRL ENMT: Mucous membranes moist NECK: supple no meningeal signs SPINE:entire spine nontender.  Well healed scar to thoracic spine.  Stimulator noted to lumbar paraspinal region, no tenderness/fluctuance CV: S1/S2 noted, no murmurs/rubs/gallops noted LUNGS: Lungs are clear to auscultation bilaterally, no apparent distress ABDOMEN: soft, nontender, no rebound or guarding GU:no  cva tenderness NEURO: Awake/alert, equal distal motor: hip flexion/knee flexion/extension, ankle dorsi/plantar flexion, great toe extension intact bilaterally, no clonus bilaterally, plantar reflex appropriate, no apparent sensory deficit in any dermatome.  Equal patellar/achilles reflex noted.   EXTREMITIES: pulses normal, full ROM. Tenderness to palpation of right buttock/posterior thigh but no erythema/abscess/edema/crepitance noted.  The LE are symmetric, no edema.  They are warm to touch.  No calf tenderness/palpable cord noted.  Distal pulses symmetric/equal SKIN: warm, color normal PSYCH: no abnormalities of mood noted  ED Course  Procedures     Labs Reviewed  CBC  DIFFERENTIAL  BASIC METABOLIC PANEL   1:47 AM Plan is to treat pain, check imaging to ensure no dislogdement of hardware, and reassess.  Will likely call her orthopedist for f/u.  No neuro deficits noted.  Afebrile here.    10:06 AM D/w dr Shon Baton, her orthopedist He injected her SI joint earlier this month We discussed that she has no neuro deficits No further workup recommended if improved He reports she has been followed by pain management in the past She can f/u with him next week  Pt ambulated for me without difficulty I did give her #10 percocet and advised close f/u with PCP (no recent narcotics per database)  The patient appears reasonably screened and/or stabilized for discharge and I doubt any other medical condition or other [facility] requiring further screening, evaluation, or treatment in the ED at this time prior to discharge.    MDM  Nursing notes reviewed and considered in documentation All labs/vitals reviewed and considered xrays reviewed and considered Previous records reviewed and considered         Joya Gaskins, MD 03/25/11 [phone]

## 2011-03-25 NOTE — Discharge Instructions (Signed)

## 2011-03-25 NOTE — ED Notes (Signed)
Patient reporting pain medication not working, Rebecca [name], [place] notified and will attend to patient shortly.

## 2011-03-25 NOTE — ED Notes (Signed)
PT. REPORTS PAIN AT RIGHT BUTTOCKS RADIATING DOWN TO TIGHT LEG ONSET LAST NIGHT , DENIES INJURY OR FALL . AMBULATORY.

## 2011-03-25 NOTE — ED Notes (Signed)
Patient reporting pain 9/10 and is requesting more medication.  Will notify [place], [place]

## 2011-03-25 NOTE — ED Notes (Signed)
Patient refusing to ambulate, reporting Dilaudid is not working to manage her pain.  [place], [place] notified.

## 2011-03-25 NOTE — ED Notes (Addendum)
Right leg pain , feel like a big knot right thigh and buttock. started today , denies any hx of fall. Hx of back surg

## 2011-03-30 ENCOUNTER — Encounter (HOSPITAL_COMMUNITY): Payer: Self-pay

## 2011-03-30 ENCOUNTER — Emergency Department (HOSPITAL_COMMUNITY)
Admission: EM | Admit: 2011-03-30 | Discharge: 2011-03-30 | Disposition: A | Discharge status: Home / Self Care | Payer: Medicare Other | Attending: Emergency Medicine | Admitting: Emergency Medicine

## 2011-03-30 DIAGNOSIS — F17200 Nicotine dependence, unspecified, uncomplicated: Secondary | ICD-10-CM | POA: Insufficient documentation

## 2011-03-30 DIAGNOSIS — M545 Low back pain, unspecified: Secondary | ICD-10-CM | POA: Insufficient documentation

## 2011-03-30 DIAGNOSIS — E119 Type 2 diabetes mellitus without complications: Secondary | ICD-10-CM | POA: Insufficient documentation

## 2011-03-30 DIAGNOSIS — M549 Dorsalgia, unspecified: Secondary | ICD-10-CM | POA: Diagnosis present

## 2011-03-30 DIAGNOSIS — G8929 Other chronic pain: Secondary | ICD-10-CM | POA: Insufficient documentation

## 2011-03-30 LAB — URINALYSIS, ROUTINE W REFLEX MICROSCOPIC
Bilirubin Urine: NEGATIVE
Glucose, UA: NEGATIVE mg/dL
Hgb urine dipstick: NEGATIVE
Protein, ur: NEGATIVE mg/dL
Urobilinogen, UA: 1 mg/dL (ref 0.0–1.0)

## 2011-03-30 MED ORDER — KETOROLAC TROMETHAMINE 30 MG/ML IJ SOLN
30.0000 mg | Freq: Once | INTRAMUSCULAR | Status: AC
  Administered 2011-03-30: 30 mg via INTRAVENOUS

## 2011-03-30 MED ORDER — HYDROMORPHONE HCL PF 1 MG/ML IJ SOLN
1.0000 mg | Freq: Once | INTRAMUSCULAR | Status: AC
  Administered 2011-03-30: 1 mg via INTRAMUSCULAR
  Filled 2011-03-30: qty 1

## 2011-03-30 MED ORDER — KETOROLAC TROMETHAMINE 30 MG/ML IJ SOLN
INTRAMUSCULAR | Status: AC
  Filled 2011-03-30: qty 1

## 2011-03-30 MED ORDER — OXYCODONE-ACETAMINOPHEN 5-325 MG PO TABS: 1.0000 | ORAL_TABLET | Freq: Four times a day (QID) | ORAL | Status: DC | PRN

## 2011-03-30 MED ORDER — CYCLOBENZAPRINE HCL 10 MG PO TABS: 10.0000 mg | ORAL_TABLET | Freq: Two times a day (BID) | ORAL | Status: DC | PRN

## 2011-03-30 MED ORDER — METHOCARBAMOL 500 MG PO TABS
1000.0000 mg | ORAL_TABLET | Freq: Once | ORAL | Status: AC
  Administered 2011-03-30: 1000 mg via ORAL
  Filled 2011-03-30: qty 2

## 2011-03-30 NOTE — ED Notes (Signed)
Here a week ago for severe low. Back pain. Received demerol and prescribed Vicodin and ineffective. Has neuro stimulator which is to help with pain. Dr. Shon Baton his her dr. Did not call for a follow-up.

## 2011-03-30 NOTE — ED Provider Notes (Signed)
History     CSN: [id number]  Arrival date & time 03/30/11  4098   First MD Initiated Contact with Patient 03/30/11 0919      Chief Complaint  Patient presents with  . Back Pain  . Leg Pain  . Dysuria    (Consider location/radiation/quality/duration/timing/severity/associated sxs/prior treatment) HPI Comments: Patient comes in today with chronic lower back pain.  Pain radiates down the legs posteriorly.  She describes the pain as both muscle spasms and a burning sensation.  She was seen for the same complaint 5 days ago.  She reports that the pain today is no different than the pain she had 5 days ago.  At that time she had xrays done of her thoracic spine and lumbar spine, which did not show anything new.  At that time she was given Percocet for her pain, which she does not feel helps.  She has a spinal stimulator for her back pain and sees Dr. Shon Baton.  She denies any recent injury.  She was instructed at her last visit in the ED five days ago to follow up with Orthopedics, which she has not done.  Patient is also concerned that she is urinating less.  However, she denies any abdominal pain and is not retaining urine.  No loss of bowel or bladder function, no saddle anaesthesia.    Patient is a 52 y.o. female presenting with back pain. The history is provided by the patient.  Back Pain  This is a chronic problem. The pain is present in the lumbar spine. The quality of the pain is described as burning. The symptoms are aggravated by bending. Pertinent negatives include no fever, no numbness, no bowel incontinence, no perianal numbness, no bladder incontinence, no dysuria, no paresthesias, no paresis, no tingling and no weakness.    Past Medical History  Diagnosis Date  . Mood disorder   . GERD (gastroesophageal reflux disease)   . Chronic pain   . Back pain   . Diabetes mellitus     Past Surgical History  Procedure Date  . Mood disorder   . Spinal cord stimulator implant   .  Abdominal hysterectomy   . Back surgery   . Carpal tunnel release     No family history on file.  History  Substance Use Topics  . Smoking status: Current Everyday Smoker  . Smokeless tobacco: Not on file  . Alcohol Use: Yes    OB History    Grav Para Term Preterm Abortions TAB SAB Ect Mult Living                  Review of Systems  Constitutional: Negative for fever.  HENT: Negative for neck pain and neck stiffness.   Gastrointestinal: Negative for nausea, vomiting and bowel incontinence.  Genitourinary: Positive for decreased urine volume. Negative for bladder incontinence, dysuria, hematuria and flank pain.  Musculoskeletal: Positive for back pain.  Skin: Negative for rash.  Neurological: Negative for tingling, weakness, numbness and paresthesias.    Allergies  Sulfa antibiotics; Ciprofloxacin; Keflex; Reglan; Restoril; and Risperdal  Home Medications   Current Outpatient Rx  Name Route Sig Dispense Refill  . ATORVASTATIN CALCIUM 10 MG PO TABS Oral Take 5 mg by mouth at bedtime.    Marland Kitchen DIAZEPAM 10 MG PO TABS Oral Take 10 mg by mouth daily.     Marland Kitchen GABAPENTIN 300 MG PO CAPS Oral Take 600-900 mg by mouth 4 (four) times daily. 1 capsule threes times daily  2 capsules  at bedtime    . IBUPROFEN 800 MG PO TABS Oral Take 800 mg by mouth every 8 (eight) hours as needed. For pain.    . OXYCODONE-ACETAMINOPHEN 5-325 MG PO TABS Oral Take 1 tablet by mouth every 4 (four) hours as needed. For pain.    Marland Kitchen MEDROXYPROGESTERONE ACETATE 150 MG/ML IM SUSP Intramuscular Inject 1 mL (150 mg total) into the muscle every 3 (three) months. 1 mL 0    BP 103/71  Pulse 73  Temp(Src) 98 F (36.7 C) (Oral)  Resp 20  SpO2 97%  Physical Exam  Nursing note and vitals reviewed. Constitutional: She is oriented to person, place, and time. She appears well-developed and well-nourished. No distress.  HENT:  Head: Normocephalic and atraumatic.  Eyes: Conjunctivae and EOM are normal. Pupils are  equal, round, and reactive to light. No scleral icterus.  Neck: Normal range of motion and full passive range of motion without pain. Neck supple. No spinous process tenderness and no muscular tenderness present. No rigidity. Normal range of motion present. No Brudzinski's sign noted.  Cardiovascular: Normal rate, regular rhythm, normal heart sounds and intact distal pulses.  Exam reveals no gallop and no friction rub.   No murmur heard. Pulmonary/Chest: Effort normal and breath sounds normal. No respiratory distress. She has no wheezes. She has no rales. She exhibits no tenderness.  Musculoskeletal:       Cervical back: She exhibits normal range of motion, no tenderness, no bony tenderness and no pain.       Thoracic back: She exhibits no tenderness, no bony tenderness and no pain.       Lumbar back: She exhibits tenderness, bony tenderness and pain. She exhibits no spasm and normal pulse.       Right foot: She exhibits no swelling.       Left foot: She exhibits no swelling.       Bilateral lower extremities nontender without color change, baseline range of motion of extremities with intact distal pulses.  Pt has increased pain w ROM of lumbar spine. Pain w ambulation, no sign of ataxia.  Neurological: She is alert and oriented to person, place, and time. She has normal strength and normal reflexes. No sensory deficit. Gait (no ataxia, slowed and hunched d/t pain ) abnormal.       sensation at baseline for light touch in all 4 distal extremities, motor symmetric & bilateral 5/5  Abduction,adduction,flexion of hips, knee flexion & extension, foot dorsiflexion, foot plantar flexion.  Skin: Skin is warm and dry. No rash noted. She is not diaphoretic. No erythema. No pallor.  Psychiatric: She has a normal mood and affect.    ED Course  Procedures (including critical care time)   Labs Reviewed  URINALYSIS, ROUTINE W REFLEX MICROSCOPIC   No results found.   1. Chronic low back pain      11:23 AM Reassessed patient.  She reports that her pain has improved a lot.  MDM  Patient with back pain.  No neurological deficits and normal neuro exam.  Patient can walk but states is painful.  No loss of bowel or bladder control.  No concern for cauda equina.  No fever, night sweats, weight loss, h/o cancer, IVDU.  RICE protocol and pain medicine indicated and discussed with patient.   Patient instructed to follow up with Dr. Shon Baton Orthopedic surgery.        Pascal Lux [facility], PA-C 03/30/11 2118

## 2011-03-30 NOTE — Discharge Instructions (Signed)
Followup with orthopedics. Use conservative methods at home including heat therapy and cold therapy as we discussed. More information on cold therapy is listed below.  It is not recommended to use heat treatment directly after an acute injury.  Take pain medication as directed.  Do not drive or operate heavy machinery while taking this medication.  SEEK IMMEDIATE MEDICAL ATTENTION IF: New numbness, tingling, weakness, or problem with the use of your arms or legs.  Severe back pain not relieved with medications.  Change in bowel or bladder control.  Increasing pain in any areas of the body (such as chest or abdominal pain).  Shortness of breath, dizziness or fainting.  Nausea (feeling sick to your stomach), vomiting, fever, or sweats.  COLD THERAPY DIRECTIONS:  Ice or gel packs can be used to reduce both pain and swelling. Ice is the most helpful within the first 24 to 48 hours after an injury or flareup from overusing a muscle or joint.  Ice is effective, has very few side effects, and is safe for most people to use.   If you expose your skin to cold temperatures for too long or without the proper protection, you can damage your skin or nerves. Watch for signs of skin damage due to cold.   HOME CARE INSTRUCTIONS  Follow these tips to use ice and cold packs safely.  Place a dry or damp towel between the ice and skin. A damp towel will cool the skin more quickly, so you may need to shorten the time that the ice is used.  For a more rapid response, add gentle compression to the ice.  Ice for no more than 10 to 20 minutes at a time. The bonier the area you are icing, the less time it will take to get the benefits of ice.  Check your skin after 5 minutes to make sure there are no signs of a poor response to cold or skin damage.  Rest 20 minutes or more in between uses.  Once your skin is numb, you can end your treatment. You can test numbness by very lightly touching your skin. The touch should be  so light that you do not see the skin dimple from the pressure of your fingertip. When using ice, most people will feel these normal sensations in this order: cold, burning, aching, and numbness.  Do not use ice on someone who cannot communicate their responses to pain, such as small children or people with dementia.   HOW TO MAKE AN ICE PACK  To make an ice pack, do one of the following:  Place crushed ice or a bag of frozen vegetables in a sealable plastic bag. Squeeze out the excess air. Place this bag inside another plastic bag. Slide the bag into a pillowcase or place a damp towel between your skin and the bag.  Mix 3 parts water with 1 part rubbing alcohol. Freeze the mixture in a sealable plastic bag. When you remove the mixture from the freezer, it will be slushy. Squeeze out the excess air. Place this bag inside another plastic bag. Slide the bag into a pillowcase or place a damp towel between your skin and the bag.   SEEK MEDICAL CARE IF:  You develop white spots on your skin. This may give the skin a blotchy (mottled) appearance.  Your skin turns blue or pale.  Your skin becomes waxy or hard.  Your swelling gets worse.  MAKE SURE YOU:  Understand these instructions.  Will watch  your condition.  Will get help right away if you are not doing well or get worse.    Chronic Pain Discharge Instructions  Emergency care providers appreciate that many patients coming to [name] are in severe pain and we wish to address their pain in the safest, most responsible manner.  It is important to recognize however, that the proper treatment of chronic pain differs from that of the pain of injuries and acute illnesses.  Our goal is to provide quality, safe, personalized care and we thank you for giving [name] the opportunity to serve you. The use of narcotics and related agents for chronic pain syndromes may lead to additional physical and psychological problems.  Nearly as many people die from prescription  narcotics each year as die from car crashes.  Additionally, this risk is increased if such prescriptions are obtained from a variety of sources.  Therefore, only your primary care physician or a pain management specialist is able to safely treat such syndromes with narcotic medications long-term.    Documentation revealing such prescriptions have been sought from multiple sources may prohibit [name] from providing a refill or different narcotic medication.  Your name may be checked first through the [facility] Controlled Substances Reporting System.  This database is a record of controlled substance medication prescriptions that the patient has received.  This has been established by [facility] in an effort to eliminate the dangerous, and often life threatening, practice of obtaining multiple prescriptions from different medical providers.   If you have a chronic pain syndrome (i.e. chronic headaches, recurrent back or neck pain, dental pain, abdominal or pelvis pain without a specific diagnosis, or neuropathic pain such as fibromyalgia) or recurrent visits for the same condition without an acute diagnosis, you may be treated with non-narcotics and other non-addictive medicines.  Allergic reactions or negative side effects that may be reported by a patient to such medications will not typically lead to the use of a narcotic analgesic or other controlled substance as an alternative.   Patients managing chronic pain with a personal physician should have provisions in place for breakthrough pain.  If you are in crisis, you should call your physician.  If your physician directs you to the emergency department, please have the doctor call and speak to our attending physician concerning your care.   When patients come to the Emergency Department (ED) with acute medical conditions in which the Emergency Department physician feels appropriate to prescribe narcotic or sedating pain medication, the physician will  prescribe these in very limited quantities.  The amount of these medications will last only until you can see your primary care physician in his/her office.  Any patient who returns to the ED seeking refills should expect only non-narcotic pain medications.   In the event of an acute medical condition exists and the emergency physician feels it is necessary that the patient be given a narcotic or sedating medication -  a responsible adult driver should be present in the room prior to the medication being given by the nurse.   Prescriptions for narcotic or sedating medications that have been lost, stolen or expired will not be refilled in the Emergency Department.    Patients who have chronic pain may receive non-narcotic prescriptions until seen by their primary care physician.  It is every patient's personal responsibility to maintain active prescriptions with his or her primary care physician or specialist.

## 2011-03-31 NOTE — ED Provider Notes (Signed)
Medical screening examination/treatment/procedure(s) were performed by non-physician practitioner and as supervising physician I was immediately available for consultation/collaboration.   Suzi Roots, MD 03/31/11 1110

## 2011-04-04 ENCOUNTER — Encounter (HOSPITAL_COMMUNITY): Payer: Self-pay | Admitting: Emergency Medicine

## 2011-04-04 ENCOUNTER — Emergency Department (HOSPITAL_COMMUNITY)
Admission: EM | Admit: 2011-04-04 | Discharge: 2011-04-04 | Disposition: A | Discharge status: Home / Self Care | Payer: Medicare Other | Attending: Emergency Medicine | Admitting: Emergency Medicine

## 2011-04-04 DIAGNOSIS — F17200 Nicotine dependence, unspecified, uncomplicated: Secondary | ICD-10-CM | POA: Insufficient documentation

## 2011-04-04 DIAGNOSIS — M545 Low back pain, unspecified: Secondary | ICD-10-CM | POA: Insufficient documentation

## 2011-04-04 DIAGNOSIS — G8929 Other chronic pain: Secondary | ICD-10-CM | POA: Insufficient documentation

## 2011-04-04 DIAGNOSIS — K219 Gastro-esophageal reflux disease without esophagitis: Secondary | ICD-10-CM | POA: Insufficient documentation

## 2011-04-04 DIAGNOSIS — F39 Unspecified mood [affective] disorder: Secondary | ICD-10-CM | POA: Insufficient documentation

## 2011-04-04 DIAGNOSIS — E119 Type 2 diabetes mellitus without complications: Secondary | ICD-10-CM | POA: Insufficient documentation

## 2011-04-04 MED ORDER — ONDANSETRON 4 MG PO TBDP
4.0000 mg | ORAL_TABLET | Freq: Once | ORAL | Status: AC
  Administered 2011-04-04: 4 mg via ORAL
  Filled 2011-04-04: qty 1

## 2011-04-04 MED ORDER — HYDROMORPHONE HCL PF 2 MG/ML IJ SOLN
2.0000 mg | Freq: Once | INTRAMUSCULAR | Status: AC
  Administered 2011-04-04: 2 mg via INTRAMUSCULAR
  Filled 2011-04-04: qty 1

## 2011-04-04 MED ORDER — CYCLOBENZAPRINE HCL 10 MG PO TABS: 10.0000 mg | ORAL_TABLET | Freq: Three times a day (TID) | ORAL | Status: DC | PRN

## 2011-04-04 MED ORDER — KETOROLAC TROMETHAMINE 60 MG/2ML IM SOLN
60.0000 mg | Freq: Once | INTRAMUSCULAR | Status: AC
  Administered 2011-04-04: 60 mg via INTRAMUSCULAR
  Filled 2011-04-04: qty 2

## 2011-04-04 NOTE — ED Notes (Signed)
Knots on legs and back pain  X 2 weeks was seen here on the 10th for same

## 2011-04-04 NOTE — ED Provider Notes (Signed)
History     CSN: [id number]  Arrival date & time 04/04/11  0741   First MD Initiated Contact with Patient 04/04/11 [phone]      Chief Complaint  Patient presents with  . Leg Pain    (Consider location/radiation/quality/duration/timing/severity/associated sxs/prior treatment) HPI Comments: Chronic lower back pain with chronic radiation down the back of both legs. She's been seen for this complaint twice in the emergency department in the last 10 days. At her initial visit, there were imaging studies performed of her thoracic and lumbar spine with no acute findings. No new injury today. Pain in back is unchanged, though she does report new tingling to bilateral feet intermittently. Denies fever, chills, fecal incontinence, urinary retention, saddle anesthesia, LE numbness or weakness. She has a history of back surgery performed by Dr. Shon Baton, who she is continued to see but he has recently advised that she followup with pain management Dr. she has been taking Percocet, Flexeril, Valium, Neurontin for her pain with only some relief.  Patient is a 51 y.o. female presenting with back pain. The history is provided by the patient and the spouse.  Back Pain  This is a chronic problem. The current episode started more than 1 week ago. The problem occurs constantly. The problem has been gradually worsening. The pain is associated with no known injury. The pain is present in the lumbar spine.    Past Medical History  Diagnosis Date  . Mood disorder   . GERD (gastroesophageal reflux disease)   . Chronic pain   . Back pain   . Diabetes mellitus     Past Surgical History  Procedure Date  . Mood disorder   . Spinal cord stimulator implant   . Abdominal hysterectomy   . Back surgery   . Carpal tunnel release     No family history on file.  History  Substance Use Topics  . Smoking status: Current Everyday Smoker  . Smokeless tobacco: Not on file  . Alcohol Use: Yes     Review of Systems   Musculoskeletal: Positive for back pain.  10 systems reviewed and are otherwise negative for acute change except as noted in the HPI.   Allergies  Sulfa antibiotics; Ciprofloxacin; Keflex; Reglan; Restoril; and Risperdal  Home Medications   Current Outpatient Rx  Name Route Sig Dispense Refill  . ATORVASTATIN CALCIUM 10 MG PO TABS Oral Take 5 mg by mouth at bedtime.    . CYCLOBENZAPRINE HCL 10 MG PO TABS Oral Take 1 tablet (10 mg total) by mouth 2 (two) times daily as needed for muscle spasms. 20 tablet 0  . DIAZEPAM 10 MG PO TABS Oral Take 10 mg by mouth daily.     Marland Kitchen GABAPENTIN 300 MG PO CAPS Oral Take 300-900 mg by mouth 4 (four) times daily. 1 capsule threes times daily  2 capsules at bedtime    . IBUPROFEN 800 MG PO TABS Oral Take 800 mg by mouth every 8 (eight) hours as needed. For pain.    Marland Kitchen MEDROXYPROGESTERONE ACETATE 150 MG/ML IM SUSP Intramuscular Inject 1 mL (150 mg total) into the muscle every 3 (three) months. 1 mL 0  . OXYCODONE-ACETAMINOPHEN 5-325 MG PO TABS Oral Take 1 tablet by mouth every 4 (four) hours as needed. For pain.      BP 102/79  Pulse 85  Temp 97.9 F (36.6 C)  Resp 16  SpO2 99%  Physical Exam  Nursing note and vitals reviewed. Constitutional: She is oriented to person,  place, and time. She appears well-developed and well-nourished. Distressed: uncomfortable appearing.  HENT:  Head: Normocephalic and atraumatic.  Right Ear: External ear normal.  Left Ear: External ear normal.  Neck: Normal range of motion. Neck supple.  Cardiovascular: Normal rate and regular rhythm.   Pulmonary/Chest: Effort normal and breath sounds normal. No respiratory distress. She has no wheezes.  Abdominal: Soft. Bowel sounds are normal. She exhibits no distension. There is no tenderness.  Musculoskeletal:       Cervical back: She exhibits normal range of motion, no tenderness, no bony tenderness and no spasm.       Thoracic back: She exhibits normal range of motion, no  tenderness, no bony tenderness and no spasm.       Lumbar back: She exhibits tenderness and bony tenderness. She exhibits normal range of motion and no spasm.       BLE non-tender with full passive ROM with pain, no overlying skin changes, 2+ DP pulses. Pain increased with back flexion.  Neurological: She is alert and oriented to person, place, and time. No cranial nerve deficit.       Sensation intact to light touch and symmetric in BLE. Motor symmetric bilaterally and 5/5 as tested in hip flex/ext/abd, knee flex/ext, foot plantar/sorsi flex, great toe flex/ext. Patellar reflexes are 2+ bilaterally. Gait slow with antalgia but no ataxia  Skin: Skin is warm and dry. No rash noted.    ED Course  Procedures (including critical care time)  Labs Reviewed - No data to display No results found.   1. Chronic low back pain       MDM  Chronic back pain with new subjective tingling to bilateral feet with normal neuro/motor findings on exam. No back pain red flags, no signs of cauda equina. No indication for repeat imaging or MRI in ED. Discussed chronic pain management with patient and advised f/u with Dr Shon Baton for re-evaluation and determination of the need for further testing. Discussed concerning neuro findings that should prompt her return for re-eval in ED. She will be given pain medication in ED to treat her pain here, but no additional narcotic rx will be given from ED at this time. She will be given a new rx for flexeril as requested. Will d./c home. Pt and spouse voice understanding of plan.        Shaaron Adler, [place] 04/04/11 [phone]

## 2011-04-04 NOTE — ED Notes (Signed)
Patient assisted out to car via wheelchair.  Patient very unhappy with her discharge, Dr. Effie Shy spoke with patient and family.

## 2011-04-04 NOTE — ED Notes (Signed)
Tresa Endo, charge RN at bedside speaking with patient and family.

## 2011-04-04 NOTE — ED Notes (Signed)
Judeth Cornfield PA talked with patient and patient acknowledged reason for discharge. Patient reports she's still having muscle spasms.

## 2011-04-04 NOTE — ED Notes (Signed)
Patient presents reporting muscle spasms to bilateral legs and lower back x several days.  Patient also reporting "lumps" to bilateral legs with pain 10/10 upon movement.  No lumps, bruising, abrasions or lacerations noted to either lower extremities.  Warm blanket applied.

## 2011-04-04 NOTE — ED Provider Notes (Signed)
Medical screening examination/treatment/procedure(s) were performed by non-physician practitioner and as supervising physician I was immediately available for consultation/collaboration.   Gavin Pound. Malajah Oceguera, MD 04/04/11 1052

## 2011-04-04 NOTE — Discharge Instructions (Signed)
Chronic Back Pain When back pain lasts longer than 3 months, it is called chronic back pain.This pain can be frustrating, but the cause of the pain is rarely dangerous.People with chronic back pain often go through certain periods that are more intense (flare-ups). CAUSES Chronic back pain can be caused by wear and tear (degeneration) on different structures in your back. These structures may include bones, ligaments, or discs. This degeneration may result in more pressure being placed on the nerves that travel to your legs and feet. This can lead to pain traveling from the low back down the back of the legs. When pain lasts longer than 3 months, it is not unusual for people to experience anxiety or depression. Anxiety and depression can also contribute to low back pain. TREATMENT  Establish a regular exercise plan. This is critical to improving your functional level.   Have a self-management plan for when you flare-up. Flare-ups rarely require a medical visit. Regular exercise will help reduce the intensity and frequency of your flare-ups.   Manage how you feel about your back pain and the rest of your life. Anxiety, depression, and feeling that you cannot alter your back pain have been shown to make back pain more intense and debilitating.   Medicines should never be your only treatment. They should be used along with other treatments to help you return to a more active lifestyle.   Procedures such as injections or surgery may be helpful but are rarely necessary. You may be able to get the same results with physical therapy or chiropractic care.  HOME CARE INSTRUCTIONS  Avoid bending, heavy lifting, prolonged sitting, and activities which make the problem worse.   Continue normal activity as much as possible.   Take brief periods of rest throughout the day to reduce your pain during flare-ups.   Follow your back exercise rehabilitation program. This can help reduce symptoms and prevent  more pain.   Only take over-the-counter or prescription medicines as directed by your caregiver. Muscle relaxants are sometimes prescribed. Narcotic pain medicine is discouraged for long-term pain, since addiction is a possible outcome.   If you smoke, quit.   Eat healthy foods and maintain a recommended body weight.  SEEK IMMEDIATE MEDICAL CARE IF:   You have weakness or numbness in one of your legs or feet.   You have trouble controlling your bladder or bowels.   You develop nausea, vomiting, abdominal pain, shortness of breath, or fainting.  Document Released: 02/14/2004 Document Revised: 12/26/2010 Document Reviewed: 12/21/2010 [facility] Patient Information 2012 [place], [place].        Back Exercises Back exercises help treat and prevent back injuries. The goal of back exercises is to increase the strength of your abdominal and back muscles and the flexibility of your back. These exercises should be started when you no longer have back pain. Back exercises include:  Pelvic Tilt. Lie on your back with your knees bent. Tilt your pelvis until the lower part of your back is against the floor. Hold this position 5 to 10 sec and repeat 5 to 10 times.   Knee to Chest. Pull first 1 knee up against your chest and hold for 20 to 30 seconds, repeat this with the other knee, and then both knees. This may be done with the other leg straight or bent, whichever feels better.   Sit-Ups or Curl-Ups. Bend your knees 90 degrees. Start with tilting your pelvis, and do a partial, slow sit-up, lifting your trunk only  30 to 45 degrees off the floor. Take at least 2 to 3 seconds for each sit-up. Do not do sit-ups with your knees out straight. If partial sit-ups are difficult, simply do the above but with only tightening your abdominal muscles and holding it as directed.   Hip-Lift. Lie on your back with your knees flexed 90 degrees. Push down with your feet and shoulders as you raise your hips a couple  inches off the floor; hold for 10 seconds, repeat 5 to 10 times.   Back arches. Lie on your stomach, propping yourself up on bent elbows. Slowly press on your hands, causing an arch in your low back. Repeat 3 to 5 times. Any initial stiffness and discomfort should lessen with repetition over time.   Shoulder-Lifts. Lie face down with arms beside your body. Keep hips and torso pressed to floor as you slowly lift your head and shoulders off the floor.  Do not overdo your exercises, especially in the beginning. Exercises may cause you some mild back discomfort which lasts for a few minutes; however, if the pain is more severe, or lasts for more than 15 minutes, do not continue exercises until you see your caregiver. Improvement with exercise therapy for back problems is slow.  See your caregivers for assistance with developing a proper back exercise program. Document Released: 02/14/2004 Document Revised: 12/26/2010 Document Reviewed: 01/06/2005 [facility] Patient Information 2012 [place], [place].        Chronic Pain Discharge Instructions  Emergency care providers appreciate that many patients coming to [name] are in severe pain and we wish to address their pain in the safest, most responsible manner.  It is important to recognize however, that the proper treatment of chronic pain differs from that of the pain of injuries and acute illnesses.  Our goal is to provide quality, safe, personalized care and we thank you for giving [name] the opportunity to serve you. The use of narcotics and related agents for chronic pain syndromes may lead to additional physical and psychological problems.  Nearly as many people die from prescription narcotics each year as die from car crashes.  Additionally, this risk is increased if such prescriptions are obtained from a variety of sources.  Therefore, only your primary care physician or a pain management specialist is able to safely treat such syndromes with narcotic  medications long-term.    Documentation revealing such prescriptions have been sought from multiple sources may prohibit [name] from providing a refill or different narcotic medication.  Your name may be checked first through the [facility] Controlled Substances Reporting System.  This database is a record of controlled substance medication prescriptions that the patient has received.  This has been established by [facility] in an effort to eliminate the dangerous, and often life threatening, practice of obtaining multiple prescriptions from different medical providers.   If you have a chronic pain syndrome (i.e. chronic headaches, recurrent back or neck pain, dental pain, abdominal or pelvis pain without a specific diagnosis, or neuropathic pain such as fibromyalgia) or recurrent visits for the same condition without an acute diagnosis, you may be treated with non-narcotics and other non-addictive medicines.  Allergic reactions or negative side effects that may be reported by a patient to such medications will not typically lead to the use of a narcotic analgesic or other controlled substance as an alternative.   Patients managing chronic pain with a personal physician should have provisions in place for breakthrough pain.  If you are in crisis,  you should call your physician.  If your physician directs you to the emergency department, please have the doctor call and speak to our attending physician concerning your care.   When patients come to the Emergency Department (ED) with acute medical conditions in which the Emergency Department physician feels appropriate to prescribe narcotic or sedating pain medication, the physician will prescribe these in very limited quantities.  The amount of these medications will last only until you can see your primary care physician in his/her office.  Any patient who returns to the ED seeking refills should expect only non-narcotic pain medications.   In the event  of an acute medical condition exists and the emergency physician feels it is necessary that the patient be given a narcotic or sedating medication -  a responsible adult driver should be present in the room prior to the medication being given by the nurse.   Prescriptions for narcotic or sedating medications that have been lost, stolen or expired will not be refilled in the Emergency Department.    Patients who have chronic pain may receive non-narcotic prescriptions until seen by their primary care physician.  It is every patient's personal responsibility to maintain active prescriptions with his or her primary care physician or specialist.

## 2011-04-04 NOTE — ED Notes (Signed)
Patient reports she is not leaving until she feels better, [place], [place] notified and states she has discharged the patient. Patient moved to room 4 in ST

## 2011-04-15 ENCOUNTER — Encounter (HOSPITAL_COMMUNITY): Payer: Self-pay | Admitting: *Deleted

## 2011-04-15 ENCOUNTER — Emergency Department (HOSPITAL_COMMUNITY)
Admission: EM | Admit: 2011-04-15 | Discharge: 2011-04-15 | Disposition: A | Discharge status: Home / Self Care | Payer: Medicare Other | Attending: Emergency Medicine | Admitting: Emergency Medicine

## 2011-04-15 DIAGNOSIS — M545 Low back pain, unspecified: Secondary | ICD-10-CM | POA: Insufficient documentation

## 2011-04-15 DIAGNOSIS — G8929 Other chronic pain: Secondary | ICD-10-CM

## 2011-04-15 DIAGNOSIS — M549 Dorsalgia, unspecified: Secondary | ICD-10-CM | POA: Insufficient documentation

## 2011-04-15 MED ORDER — CYCLOBENZAPRINE HCL 10 MG PO TABS: 10.0000 mg | ORAL_TABLET | Freq: Three times a day (TID) | ORAL | Status: DC | PRN

## 2011-04-15 MED ORDER — HYDROMORPHONE HCL PF 2 MG/ML IJ SOLN
2.0000 mg | Freq: Once | INTRAMUSCULAR | Status: AC
  Administered 2011-04-15: 2 mg via INTRAMUSCULAR
  Filled 2011-04-15: qty 1

## 2011-04-15 MED ORDER — OXYCODONE-ACETAMINOPHEN 5-325 MG PO TABS
ORAL_TABLET | ORAL | Status: AC
  Filled 2011-04-15: qty 2

## 2011-04-15 MED ORDER — CYCLOBENZAPRINE HCL 10 MG PO TABS: 10.0000 mg | ORAL_TABLET | Freq: Three times a day (TID) | ORAL | Status: AC | PRN

## 2011-04-15 MED ORDER — OXYCODONE-ACETAMINOPHEN 5-325 MG PO TABS
2.0000 | ORAL_TABLET | Freq: Once | ORAL | Status: AC
  Administered 2011-04-15: 2 via ORAL

## 2011-04-15 MED ORDER — OXYCODONE-ACETAMINOPHEN 10-325 MG PO TABS: 1.0000 | ORAL_TABLET | ORAL | Status: AC | PRN

## 2011-04-15 MED ORDER — CYCLOBENZAPRINE HCL 10 MG PO TABS
10.0000 mg | ORAL_TABLET | Freq: Once | ORAL | Status: AC
  Administered 2011-04-15: 10 mg via ORAL
  Filled 2011-04-15: qty 1

## 2011-04-15 NOTE — ED Provider Notes (Signed)
History     CSN: [id number]  Arrival date & time 04/15/11  5621   First MD Initiated Contact with Patient 04/15/11 [phone]      Chief Complaint  Patient presents with  . Back Pain    (Consider location/radiation/quality/duration/timing/severity/associated sxs/prior treatment) HPI Comments: Pt with chronic low back pain, has a stimulator in place by Dr. Shon Baton, not working ideally, was told by Dr. Shon Baton that he cannot offer any more options, encouraged her to see PCP and get referral to a pain specialist.  Pt is on Medicaid and Medicare.  Her PCP they have called numerous times and has not yet received a referral.  Pt ran out of percocet 10 mg and flexeril given to her by ED about 2 days ago and has been gradually worsening and now can't sleep or move due to severe pain.  No falls, no new trauma, no new numbness or weakness, no urinary incontinence, no abd pain.    Patient is a 51 y.o. female presenting with back pain. The history is provided by the spouse and the patient.  Back Pain  Pertinent negatives include no fever, no numbness, no abdominal pain, no pelvic pain and no weakness.    Past Medical History  Diagnosis Date  . Mood disorder   . GERD (gastroesophageal reflux disease)   . Chronic pain   . Back pain   . Diabetes mellitus     Past Surgical History  Procedure Date  . Mood disorder   . Spinal cord stimulator implant   . Abdominal hysterectomy   . Back surgery   . Carpal tunnel release     No family history on file.  History  Substance Use Topics  . Smoking status: Current Everyday Smoker  . Smokeless tobacco: Not on file  . Alcohol Use: Yes    OB History    Grav Para Term Preterm Abortions TAB SAB Ect Mult Living                  Review of Systems  Constitutional: Negative for fever and chills.  Gastrointestinal: Negative for nausea, vomiting, abdominal pain and abdominal distention.  Genitourinary: Negative for pelvic pain.  Musculoskeletal:  Positive for back pain.  Skin: Negative for rash and wound.  Neurological: Negative for weakness and numbness.    Allergies  Sulfa antibiotics; Ciprofloxacin; Keflex; Reglan; Restoril; and Risperdal  Home Medications   Current Outpatient Rx  Name Route Sig Dispense Refill  . ATORVASTATIN CALCIUM 10 MG PO TABS Oral Take 5 mg by mouth at bedtime.    Marland Kitchen DIAZEPAM 10 MG PO TABS Oral Take 10 mg by mouth daily.     Marland Kitchen DIPHENHYDRAMINE-APAP (SLEEP) 25-500 MG PO TABS Oral Take 1 tablet by mouth at bedtime as needed.    Marland Kitchen GABAPENTIN 300 MG PO CAPS Oral Take 300-900 mg by mouth 4 (four) times daily. 1 capsule threes times daily  2 capsules at bedtime    . IBUPROFEN 800 MG PO TABS Oral Take 800 mg by mouth every 8 (eight) hours as needed. For pain.    . CYCLOBENZAPRINE HCL 10 MG PO TABS Oral Take 1 tablet (10 mg total) by mouth 3 (three) times daily as needed for muscle spasms. 21 tablet 0    BP 105/52  Pulse 98  Temp(Src) 98.4 F (36.9 C) (Oral)  Resp 20  SpO2 98%  Physical Exam  Nursing note and vitals reviewed. Constitutional: She is oriented to person, place, and time. She appears well-developed  and well-nourished. She appears distressed.  HENT:  Head: Normocephalic.  Neck: Neck supple.  Cardiovascular: Normal rate.   Abdominal: Soft. There is no tenderness.  Musculoskeletal:       Lumbar back: She exhibits decreased range of motion, tenderness and pain. She exhibits no bony tenderness, no swelling, no edema and normal pulse.  Neurological: She is alert and oriented to person, place, and time. She has normal strength. She displays normal reflexes. She exhibits normal muscle tone.  Skin: Skin is warm. No rash noted. She is not diaphoretic.    ED Course  Procedures (including critical care time)  Labs Reviewed - No data to display No results found.   1. Chronic back pain       MDM  I have reviewed prior notes.  Pt given IM analgesics here, pt and family counseled regarding  use of the ED for chronic pain.  Pt told to follow up with Dr. Shon Baton or to simply go to PCP office to see PCP and receive referral to pain clinic in person.  I have provided info on [facility] in [place] for spouse to try to contact to see if he can get her in.          Gavin Pound. Oletta Lamas, MD 04/15/11 [phone]

## 2011-04-15 NOTE — ED Notes (Signed)
Pt refuses to sign discharge paper, husband voiced understanding to f/u with PCP, pain clinic.

## 2011-04-15 NOTE — ED Notes (Signed)
Attempted to d/c pt, pt states she is not leaving because pain is not improved.  EDP notified and to reevaluate

## 2011-04-15 NOTE — ED Notes (Signed)
Pt woke up with exacerbation of chronic back pain.  Pt has a nerve stimulator in her back, increased pain is not associated with any trauma.  No weakness or numbness with this.

## 2011-04-15 NOTE — Discharge Instructions (Signed)
Chronic Pain Management Managing chronic pain is not easy. The goal is to provide as much pain relief as possible. There are emotional as well as physical problems. Chronic pain may lead to symptoms of depression which magnify those of the pain. Problems may include:  Anxiety.   Sleep disturbances.   Confused thinking.   Feeling cranky.   Fatigue.   Weight gain or loss.  Identify the source of the pain first, if possible. The pain may be masking another problem. Try to find a pain management specialist or clinic. Work with a team to create a treatment plan for you. MEDICATIONS  May include narcotics or opioids. Larger than normal doses may be needed to control your pain.   Drugs for depression may help.   Over-the-counter medicines may help for some conditions. These drugs may be used along with others for better pain relief.   May be injected into sites such as the spine and joints. Injections may have to be repeated if they wear off.  THERAPY MAY INCLUDE:  Working with a physical therapist to keep from getting stiff.   Regular, gentle exercise.   Cognitive or behavioral therapy.   Using complementary or integrative medicine such as:   Acupuncture.   Massage, Reiki, or Rolfing.   Aroma, color, light, or sound therapy.   Group support.  FOR MORE INFORMATION http://www.painfoundation.org. American Chronic Pain Association http://www.thealpa.org. Document Released: 02/14/2004 Document Revised: 12/26/2010 Document Reviewed: 03/25/2007 [employer] Patient Information 2012 [employer].    Narcotic and benzodiazepine use may cause drowsiness, slowed breathing or dependence.  Please use with caution and do not drive, operate machinery or watch young children alone while taking them.  Taking combinations of these medications or drinking alcohol will potentiate these effects.    

## 2011-04-15 NOTE — ED Notes (Signed)
Pt c/o flare up of chronic low back pain, has nerve stimulator.  States ran out of flexeril 2-3 days ago, pain worse today, worse at 1 am, had husband bring to ED for pain management.

## 2011-07-30 ENCOUNTER — Encounter (HOSPITAL_COMMUNITY): Payer: Self-pay | Admitting: Emergency Medicine

## 2011-07-30 ENCOUNTER — Emergency Department (HOSPITAL_COMMUNITY): Payer: Medicare Other

## 2011-07-30 DIAGNOSIS — M79609 Pain in unspecified limb: Secondary | ICD-10-CM | POA: Insufficient documentation

## 2011-07-30 DIAGNOSIS — E119 Type 2 diabetes mellitus without complications: Secondary | ICD-10-CM | POA: Insufficient documentation

## 2011-07-30 DIAGNOSIS — G8929 Other chronic pain: Secondary | ICD-10-CM | POA: Insufficient documentation

## 2011-07-30 DIAGNOSIS — Z79899 Other long term (current) drug therapy: Secondary | ICD-10-CM | POA: Insufficient documentation

## 2011-07-30 DIAGNOSIS — K219 Gastro-esophageal reflux disease without esophagitis: Secondary | ICD-10-CM | POA: Insufficient documentation

## 2011-07-30 DIAGNOSIS — M7989 Other specified soft tissue disorders: Secondary | ICD-10-CM | POA: Insufficient documentation

## 2011-07-30 LAB — CBC WITH DIFFERENTIAL/PLATELET
Basophils Relative: 0 % (ref 0–1)
Eosinophils Absolute: 0.1 10*3/uL (ref 0.0–0.7)
Eosinophils Relative: 1 % (ref 0–5)
Hemoglobin: 15.4 g/dL — ABNORMAL HIGH (ref 12.0–15.0)
Lymphs Abs: 3.2 10*3/uL (ref 0.7–4.0)
MCH: 31.2 pg (ref 26.0–34.0)
MCHC: 35.2 g/dL (ref 30.0–36.0)
MCV: 88.6 fL (ref 78.0–100.0)
Monocytes Relative: 10 % (ref 3–12)
Platelets: 244 10*3/uL (ref 150–400)
RBC: 4.93 MIL/uL (ref 3.87–5.11)

## 2011-07-30 LAB — BASIC METABOLIC PANEL
BUN: 10 mg/dL (ref 6–23)
CO2: 22 mEq/L (ref 19–32)
Calcium: 9.7 mg/dL (ref 8.4–10.5)
Glucose, Bld: 94 mg/dL (ref 70–99)
Sodium: 141 mEq/L (ref 135–145)

## 2011-07-30 NOTE — ED Notes (Addendum)
PT reports left leg swelling starting Sunday in area behind knee; neuropathy hx; leg pain that feels like pins and needles worsening over days. Reports does not feel like neuropathy pain. Smoker.  Denies chest pain or shortness of breath since onset of symptoms or today.

## 2011-07-31 ENCOUNTER — Emergency Department (HOSPITAL_COMMUNITY)
Admission: EM | Admit: 2011-07-31 | Discharge: 2011-07-31 | Disposition: A | Discharge status: Home / Self Care | Payer: Medicare Other | Attending: Emergency Medicine | Admitting: Emergency Medicine

## 2011-07-31 DIAGNOSIS — G8929 Other chronic pain: Secondary | ICD-10-CM

## 2011-07-31 MED ORDER — MORPHINE SULFATE 10 MG/ML IJ SOLN
10.0000 mg | Freq: Once | INTRAMUSCULAR | Status: AC
  Administered 2011-07-31: 10 mg via INTRAMUSCULAR
  Filled 2011-07-31: qty 1

## 2011-07-31 MED ORDER — HYDROMORPHONE HCL PF 2 MG/ML IJ SOLN
2.0000 mg | Freq: Once | INTRAMUSCULAR | Status: AC
  Administered 2011-07-31: 2 mg via INTRAMUSCULAR
  Filled 2011-07-31: qty 1

## 2011-07-31 MED ORDER — KETOROLAC TROMETHAMINE 60 MG/2ML IM SOLN
60.0000 mg | Freq: Once | INTRAMUSCULAR | Status: AC
  Administered 2011-07-31: 60 mg via INTRAMUSCULAR
  Filled 2011-07-31: qty 2

## 2011-07-31 NOTE — ED Provider Notes (Signed)
History     CSN: [id number]  Arrival date & time 07/30/11  Barry Brunner   First MD Initiated Contact with Patient 07/31/11 0102      Chief Complaint  Patient presents with  . Leg Swelling    (Consider location/radiation/quality/duration/timing/severity/associated sxs/prior treatment) HPI Pt with long history of chronic pain, has nerve stimulator, reports she has had three days of increasing pain, not relieved by her home pain medications. She initially reported to Triage that her pain is located in her L leg, under the knee, but on my eval reports severe burning pain from head to toe. No new injuries. No falls. No fevers or incontinence.   Past Medical History  Diagnosis Date  . Mood disorder   . GERD (gastroesophageal reflux disease)   . Chronic pain   . Back pain   . Diabetes mellitus     Past Surgical History  Procedure Date  . Mood disorder   . Spinal cord stimulator implant   . Abdominal hysterectomy   . Back surgery   . Carpal tunnel release     History reviewed. No pertinent family history.  History  Substance Use Topics  . Smoking status: Current Everyday Smoker -- 2.0 packs/day  . Smokeless tobacco: Not on file  . Alcohol Use: 4.2 oz/week    7 Glasses of wine per week      bottle a night    OB History    Grav Para Term Preterm Abortions TAB SAB Ect Mult Living                  Review of Systems All other systems reviewed and are negative except as noted in HPI.   Allergies  Sulfa antibiotics; Ciprofloxacin; Keflex; Metoclopramide hcl; Restoril; and Risperidone  Home Medications   Current Outpatient Rx  Name Route Sig Dispense Refill  . ATORVASTATIN CALCIUM 10 MG PO TABS Oral Take 5 mg by mouth at bedtime.    Marland Kitchen DIAZEPAM 10 MG PO TABS Oral Take 10 mg by mouth daily.     Marland Kitchen GABAPENTIN 300 MG PO CAPS Oral Take 300-900 mg by mouth 4 (four) times daily. 1 capsule threes times daily  2 capsules at bedtime    . IBUPROFEN 800 MG PO TABS Oral Take 800 mg by  mouth every 6 (six) hours as needed. For pain.    . OXYCODONE-ACETAMINOPHEN 10-650 MG PO TABS Oral Take 1 tablet by mouth every 6 (six) hours as needed. For pain      BP 110/86  Pulse 99  Temp 98.8 F (37.1 C) (Oral)  Resp 22  SpO2 98%  Physical Exam  Nursing note and vitals reviewed. Constitutional: She is oriented to person, place, and time. She appears well-developed and well-nourished.       Writhing in pain  HENT:  Head: Normocephalic and atraumatic.  Eyes: EOM are normal. Pupils are equal, round, and reactive to light.  Neck: Normal range of motion. Neck supple.  Cardiovascular: Normal rate, normal heart sounds and intact distal pulses.   Pulmonary/Chest: Effort normal and breath sounds normal.  Abdominal: Bowel sounds are normal. She exhibits no distension. There is no tenderness.  Musculoskeletal: Normal range of motion. She exhibits tenderness. She exhibits no edema.       Diffuse tenderness to multiple areas  Neurological: She is alert and oriented to person, place, and time. She has normal strength. No cranial nerve deficit or sensory deficit.  Skin: Skin is warm and dry. No rash noted.  Psychiatric: She has a normal mood and affect.    ED Course  Procedures (including critical care time)  Labs Reviewed  CBC WITH DIFFERENTIAL - Abnormal; Notable for the following:    Hemoglobin 15.4 (*)     All other components within normal limits  BASIC METABOLIC PANEL   Dg Chest 2 View  07/30/2011  *RADIOLOGY REPORT*  Clinical Data: Bilateral leg swelling.  Chest congestion.  CHEST - 2 VIEW  Comparison: 12/15/2010  Findings: The heart size and pulmonary vascularity are normal and the lungs are clear.  No effusions.  No acute osseous abnormality. Neurostimulator is seen in the lower thoracic spine.  IMPRESSION: No acute abnormalities.  Original Report Authenticated By: Gwynn Burly, M.D.     No diagnosis found.    MDM  Labs and imaging ordered in triage normal. Pt has  chronic pain, improved with IM meds. She now reiterates her pain is in her knees and she is worried about swelling and possibly blood clots. She points to any area on her anteromedial R knee. There is no appreciable swelling, calf tenderness or pain over the proximal veins. No concern for DVT.        Charles B. Bernette Mayers, MD 07/31/11 [phone]

## 2011-07-31 NOTE — ED Notes (Signed)
MD at bedside. 

## 2011-07-31 NOTE — ED Notes (Signed)
Pt name was called triage, but then staff was told the floor is dirty.  Pt angry and is smoking on porch.  States pain 10/10.

## 2011-07-31 NOTE — Discharge Instructions (Signed)
Your exam today is not concern for a blood clot. Please return for any worsening swelling.   Chronic Pain Chronic pain can be defined as pain that is lasting, off and on, and lasts for 3 to 6 months or longer. Many things cause chronic pain, which can make it difficult to make a discrete diagnosis. There are many treatment options available for chronic pain. However, finding a treatment that works well for you may require trying various approaches until a suitable one is found. CAUSES  In some types of chronic medical conditions, the pain is caused by a normal pain response within the body. A normal pain response helps the body identify illness or injury and prevent further damage from being done. In these cases, the cause of the pain may be identified and treated, even if it may not be cured completely. Examples of chronic conditions which can cause chronic pain include:  Inflammation of the joints (arthritis).   Back pain or neck pain (including bulging or herniated disks).   Migraine headaches.   Cancer.  In some other types of chronic pain syndromes, the pain is caused by an abnormal pain response within the body. An abnormal pain response is present when there is no ongoing cause (or stimulus) for the pain, or when the cause of the pain is arising from the nerves or nervous system itself. Examples of conditions which can cause chronic pain due to an abnormal pain response include:  Fibromyalgia.   Reflex sympathetic dystrophy (RSD).   Neuropathy (when the nerves themselves are damaged, and may cause pain).  DIAGNOSIS  Your caregiver will help diagnose your condition over time. In many cases, the initial focus will be on excluding conditions that could be causing the pain. Depending on your symptoms, your caregiver may order some tests to diagnose your condition. Some of these tests include:  Blood tests.   Computerized X-ray scans (CT scan).   Computerized magnetic scans (MRI).    X-rays.   Ultrasounds.   Nerve conduction studies.   Consultation with other physicians or specialists.  TREATMENT  There are many treatment options for people suffering from chronic pain. Finding a treatment that works well may take time.   You may be referred to a pain management specialist.   You may be put on medication to help with the pain. Unfortunately, some medications (such as opiate medications) may not be very effective in cases where chronic pain is due to abnormal pain responses. Finding the right medications can take some time.   Adjunctive therapies may be used to provide additional relief and improve a patient's quality of life. These therapies include:   Mindfulness meditation.   Acupuncture.   Biofeedback.   Cognitive-behavioral therapy.   In certain cases, surgical interventions may be attempted.  HOME CARE INSTRUCTIONS   Make sure you understand these instructions prior to discharge.   Ask any questions and share any further concerns you have with your caregiver prior to discharge.   Take all medications as directed by your caregiver.   Keep all follow-up appointments.  SEEK MEDICAL CARE IF:   Your pain gets worse.   You develop a new pain that was not present before.   You cannot tolerate any medications prescribed by your caregiver.   You develop new symptoms since your last visit with your caregiver.  SEEK IMMEDIATE MEDICAL CARE IF:   You develop muscular weakness.   You have decreased sensation or numbness.   You lose control of  bowel or bladder function.   Your pain suddenly gets much worse.   You have an oral temperature above 102 F (38.9 C), not controlled by medication.   You develop shaking chills, confusion, chest pain, or shortness of breath.  Document Released: 09/28/2001 Document Revised: 12/26/2010 Document Reviewed: 01/05/2008 [facility] Patient Information 2012 [place], [place].

## 2011-07-31 NOTE — ED Notes (Signed)
The patient is AOx4 and comfortable with her discharge instructions.  The patient's ride is present.

## 2011-08-10 ENCOUNTER — Emergency Department (HOSPITAL_COMMUNITY): Payer: Medicare Other

## 2011-08-10 ENCOUNTER — Emergency Department (HOSPITAL_COMMUNITY)
Admission: EM | Admit: 2011-08-10 | Discharge: 2011-08-11 | Disposition: A | Discharge status: Home / Self Care | Payer: Medicare Other | Attending: Emergency Medicine | Admitting: Emergency Medicine

## 2011-08-10 ENCOUNTER — Encounter (HOSPITAL_COMMUNITY): Payer: Self-pay | Admitting: Physical Medicine and Rehabilitation

## 2011-08-10 DIAGNOSIS — R0602 Shortness of breath: Secondary | ICD-10-CM | POA: Insufficient documentation

## 2011-08-10 DIAGNOSIS — Z881 Allergy status to other antibiotic agents status: Secondary | ICD-10-CM | POA: Insufficient documentation

## 2011-08-10 DIAGNOSIS — F17200 Nicotine dependence, unspecified, uncomplicated: Secondary | ICD-10-CM | POA: Insufficient documentation

## 2011-08-10 DIAGNOSIS — R109 Unspecified abdominal pain: Secondary | ICD-10-CM | POA: Insufficient documentation

## 2011-08-10 DIAGNOSIS — Z90710 Acquired absence of both cervix and uterus: Secondary | ICD-10-CM | POA: Insufficient documentation

## 2011-08-10 DIAGNOSIS — E119 Type 2 diabetes mellitus without complications: Secondary | ICD-10-CM | POA: Insufficient documentation

## 2011-08-10 DIAGNOSIS — Z882 Allergy status to sulfonamides status: Secondary | ICD-10-CM | POA: Insufficient documentation

## 2011-08-10 DIAGNOSIS — R079 Chest pain, unspecified: Secondary | ICD-10-CM | POA: Insufficient documentation

## 2011-08-10 DIAGNOSIS — K219 Gastro-esophageal reflux disease without esophagitis: Secondary | ICD-10-CM | POA: Insufficient documentation

## 2011-08-10 DIAGNOSIS — F39 Unspecified mood [affective] disorder: Secondary | ICD-10-CM | POA: Insufficient documentation

## 2011-08-10 DIAGNOSIS — G8929 Other chronic pain: Secondary | ICD-10-CM | POA: Insufficient documentation

## 2011-08-10 LAB — COMPREHENSIVE METABOLIC PANEL
ALT: 15 U/L (ref 0–35)
Alkaline Phosphatase: 47 U/L (ref 39–117)
BUN: 10 mg/dL (ref 6–23)
CO2: 19 mEq/L (ref 19–32)
Calcium: 9.5 mg/dL (ref 8.4–10.5)
GFR calc Af Amer: >90 mL/min (ref >90)
GFR calc non Af Amer: >90 mL/min (ref >90)
Glucose, Bld: 101 mg/dL — ABNORMAL HIGH (ref 70–99)
Potassium: 3.6 mEq/L (ref 3.5–5.1)
Sodium: 140 mEq/L (ref 135–145)

## 2011-08-10 LAB — CBC
HCT: 42.2 % (ref 36.0–46.0)
Hemoglobin: 14.9 g/dL (ref 12.0–15.0)
MCH: 31.6 pg (ref 26.0–34.0)
MCHC: 35.3 g/dL (ref 30.0–36.0)
RBC: 4.72 MIL/uL (ref 3.87–5.11)

## 2011-08-10 LAB — URINALYSIS, ROUTINE W REFLEX MICROSCOPIC
Glucose, UA: NEGATIVE mg/dL
Hgb urine dipstick: NEGATIVE
Ketones, ur: 15 mg/dL — AB
Protein, ur: NEGATIVE mg/dL
Urobilinogen, UA: 1 mg/dL (ref 0.0–1.0)

## 2011-08-10 LAB — GLUCOSE, CAPILLARY: Glucose-Capillary: 109 mg/dL — ABNORMAL HIGH (ref 70–99)

## 2011-08-10 MED ORDER — HYDROMORPHONE HCL PF 2 MG/ML IJ SOLN
INTRAMUSCULAR | Status: AC
  Filled 2011-08-10: qty 1

## 2011-08-10 NOTE — ED Notes (Signed)
Pt presents to department for evaluation of midsternal non radiating chest pain. Onset this morning. Pt states 10/10 pain that becomes worse with deep breathing and movement. Also states SOB. She is clammy upon arrival, respirations unlabored, speaking complete sentences. Pt is conscious alert and oriented x4.

## 2011-08-10 NOTE — ED Notes (Signed)
CBG 109 

## 2011-08-11 NOTE — ED Provider Notes (Addendum)
History     CSN: [id number]  Arrival date & time 08/10/11  1356   First MD Initiated Contact with Patient 08/10/11 1600      Chief Complaint  Patient presents with  . Chest Pain  . Shortness of Breath    (Consider location/radiation/quality/duration/timing/severity/associated sxs/prior treatment) HPI Pt presents with total body pain without focality. No N/V/D, fever, chills. She is coming from her chronic pain management MD who has fired her. She has been seen multiple times in the past for total body pain. She does not specifically c/o chest pain, SOB, or abdominal pain. She is requesting IV narcotics.  Past Medical History  Diagnosis Date  . Mood disorder   . GERD (gastroesophageal reflux disease)   . Chronic pain   . Back pain   . Diabetes mellitus     Past Surgical History  Procedure Date  . Mood disorder   . Spinal cord stimulator implant   . Abdominal hysterectomy   . Back surgery   . Carpal tunnel release     No family history on file.  History  Substance Use Topics  . Smoking status: Current Everyday Smoker -- 2.0 packs/day  . Smokeless tobacco: Not on file  . Alcohol Use: 4.2 oz/week    7 Glasses of wine per week      bottle a night    OB History    Grav Para Term Preterm Abortions TAB SAB Ect Mult Living                  Review of Systems  Constitutional: Negative for fever and chills.  Respiratory: Negative for shortness of breath.   Cardiovascular: Negative for chest pain.  Gastrointestinal: Negative for nausea, vomiting and abdominal pain.  Musculoskeletal: Positive for myalgias and arthralgias.    Allergies  Sulfa antibiotics; Ciprofloxacin; Keflex; Metoclopramide hcl; Restoril; and Risperidone  Home Medications   Current Outpatient Rx  Name Route Sig Dispense Refill  . ATORVASTATIN CALCIUM 10 MG PO TABS Oral Take 5 mg by mouth at bedtime.    Marland Kitchen DIAZEPAM 10 MG PO TABS Oral Take 10 mg by mouth daily.     Marland Kitchen GABAPENTIN 300 MG PO CAPS  Oral Take 300-900 mg by mouth 4 (four) times daily. 1 capsule threes times daily  2 capsules at bedtime    . IBUPROFEN 800 MG PO TABS Oral Take 800 mg by mouth every 6 (six) hours as needed. For pain.    . OXYCODONE-ACETAMINOPHEN 10-650 MG PO TABS Oral Take 1 tablet by mouth every 6 (six) hours as needed. For pain      BP 130/110  Pulse 115  Temp 98.2 F (36.8 C) (Oral)  Resp 18  SpO2 99%  Physical Exam  Nursing note and vitals reviewed. Constitutional: She is oriented to person, place, and time. She appears well-developed and well-nourished. No distress.       Tearful and anxious  HENT:  Head: Normocephalic and atraumatic.  Mouth/Throat: Oropharynx is clear and moist.  Eyes: EOM are normal. Pupils are equal, round, and reactive to light.  Neck: Normal range of motion. Neck supple.  Cardiovascular: Normal rate and regular rhythm.   Pulmonary/Chest: Effort normal and breath sounds normal. No respiratory distress. She has no wheezes. She has no rales.  Abdominal: Soft. Bowel sounds are normal. There is no tenderness. There is no rebound and no guarding.  Musculoskeletal: Normal range of motion. She exhibits tenderness (generalized TTP without focality. ). She exhibits no edema.  Neurological: She is alert and oriented to person, place, and time.       Moves all ext without weakness  Skin: Skin is warm and dry. No rash noted. No erythema.  Psychiatric:       Emotionally labile    ED Course  Procedures (including critical care time)  Labs Reviewed  COMPREHENSIVE METABOLIC PANEL - Abnormal; Notable for the following:    Glucose, Bld 101 (*)     All other components within normal limits  GLUCOSE, CAPILLARY - Abnormal; Notable for the following:    Glucose-Capillary 109 (*)     All other components within normal limits  URINALYSIS, ROUTINE W REFLEX MICROSCOPIC - Abnormal; Notable for the following:    Color, Urine AMBER (*)  BIOCHEMICALS MAY BE AFFECTED BY COLOR   Specific  Gravity, Urine 1.045 (*)     Bilirubin Urine MODERATE (*)     Ketones, ur 15 (*)     Nitrite POSITIVE (*)     Leukocytes, UA SMALL (*)     All other components within normal limits  URINE MICROSCOPIC-ADD ON - Abnormal; Notable for the following:    Crystals CA OXALATE CRYSTALS (*)     All other components within normal limits  CBC  TROPONIN I  LAB REPORT - SCANNED   Dg Chest 2 View  08/10/2011  *RADIOLOGY REPORT*  Clinical Data: Chest pain, shortness of breath  CHEST - 2 VIEW  Comparison: July 30, 2011.  Findings: Cardiomediastinal silhouette appears normal.  Stimulator leads are seen projected over lower thoracic spine.  No acute pulmonary disease is noted.  Bony thorax appears intact.  IMPRESSION: No acute cardiopulmonary abnormality seen.  Original Report Authenticated By: Venita Sheffield., M.D.     1. Chronic pain     Date: 10/16/2011  Rate: 111  Rhythm: sinus tachycardia  QRS Axis: normal  Intervals: normal  ST/T Wave abnormalities: normal  Conduction Disutrbances:none  Narrative Interpretation:   Old EKG Reviewed: none available     MDM  Pt's HR below 100 when calm. I have told her that she needs to find a new pain management MD to manage her chronic condition. No new findings concerning for acute disease. Despite nursing comments, pt does not relate chest pain to me and states her pain is all over her body without any focality          Loren Racer, MD 08/11/11 9629  Loren Racer, MD 10/16/11 1701

## 2011-08-11 NOTE — ED Notes (Signed)
See downtime charting. 

## 2011-08-24 ENCOUNTER — Emergency Department (HOSPITAL_COMMUNITY)
Admission: EM | Admit: 2011-08-24 | Discharge: 2011-08-25 | Disposition: A | Discharge status: Home / Self Care | Payer: Medicare Other | Attending: Emergency Medicine | Admitting: Emergency Medicine

## 2011-08-24 ENCOUNTER — Encounter (HOSPITAL_COMMUNITY): Payer: Self-pay | Admitting: *Deleted

## 2011-08-24 DIAGNOSIS — IMO0001 Reserved for inherently not codable concepts without codable children: Secondary | ICD-10-CM | POA: Insufficient documentation

## 2011-08-24 DIAGNOSIS — K219 Gastro-esophageal reflux disease without esophagitis: Secondary | ICD-10-CM | POA: Insufficient documentation

## 2011-08-24 DIAGNOSIS — M79602 Pain in left arm: Secondary | ICD-10-CM

## 2011-08-24 DIAGNOSIS — I059 Rheumatic mitral valve disease, unspecified: Secondary | ICD-10-CM | POA: Insufficient documentation

## 2011-08-24 DIAGNOSIS — Z90710 Acquired absence of both cervix and uterus: Secondary | ICD-10-CM | POA: Insufficient documentation

## 2011-08-24 DIAGNOSIS — F17200 Nicotine dependence, unspecified, uncomplicated: Secondary | ICD-10-CM | POA: Insufficient documentation

## 2011-08-24 DIAGNOSIS — E119 Type 2 diabetes mellitus without complications: Secondary | ICD-10-CM | POA: Insufficient documentation

## 2011-08-24 DIAGNOSIS — M79609 Pain in unspecified limb: Secondary | ICD-10-CM | POA: Insufficient documentation

## 2011-08-24 HISTORY — DX: Carpal tunnel syndrome, unspecified upper limb: G56.00

## 2011-08-24 HISTORY — DX: Nonrheumatic mitral (valve) prolapse: I34.1

## 2011-08-24 HISTORY — DX: Fibromyalgia: M79.7

## 2011-08-24 NOTE — ED Provider Notes (Signed)
History     CSN: [id number]  Arrival date & time 08/24/11  2049   First MD Initiated Contact with Patient 08/24/11 2233      Chief Complaint  Patient presents with  . Arm Swelling    L arm   HPI  History provided by the patient. Patient is a 51 year old female with history of diabetes, fibromyalgia and chronic back pains who presents with complaints of possible spider bite with swelling and pain of left arm. Patient reports that she first felt some symptoms of swelling to her hand and arm last night. She also reports a sharp burning aching and throbbing pain to her arm has been constant all day. Patient is on chronic pain medications for her chronic back pain and took some of this with some relief of symptoms. Patient states that she was researching her symptoms and was concerned that a spider might have bit her arm. She denies any redness or lesions to the skin. Patient also reports some history of similar symptoms on the right upper arm and states that she has plans to have MRI of her neck to look for pinched nerves. She denies any neck pain at this time. She denies any recent injury or trauma. Patient does state she's been working in the yard with increased strain recently. Patient also reports history of carpal tunnel procedure of the left arm. She denies having any chest pain, shortness of breath or heart palpitations. She denies any nausea or diaphoresis.       Past Medical History  Diagnosis Date  . Mood disorder   . GERD (gastroesophageal reflux disease)   . Chronic pain   . Back pain   . Diabetes mellitus   . Carpal tunnel syndrome   . Fibromyalgia   . Mitral valve prolapse     Past Surgical History  Procedure Date  . Mood disorder   . Spinal cord stimulator implant   . Abdominal hysterectomy   . Back surgery   . Carpal tunnel release     History reviewed. No pertinent family history.  History  Substance Use Topics  . Smoking status: Current Everyday Smoker --  1.0 packs/day  . Smokeless tobacco: Not on file  . Alcohol Use: No          OB History    Grav Para Term Preterm Abortions TAB SAB Ect Mult Living                  Review of Systems  HENT: Positive for neck pain.   Respiratory: Positive for cough.   Musculoskeletal: Positive for myalgias and back pain.  Neurological: Positive for numbness. Negative for weakness.    Allergies  Sulfa antibiotics; Ciprofloxacin; Keflex; Metoclopramide hcl; Restoril; and Risperidone  Home Medications   Current Outpatient Rx  Name Route Sig Dispense Refill  . ATORVASTATIN CALCIUM 10 MG PO TABS Oral Take 5 mg by mouth at bedtime.    . CHLORPROMAZINE HCL 25 MG PO TABS Oral Take 25 mg by mouth 2 (two) times daily.    Marland Kitchen DIAZEPAM 10 MG PO TABS Oral Take 10 mg by mouth every 6 (six) hours as needed. For anxiety    . IBUPROFEN 800 MG PO TABS Oral Take 800 mg by mouth every 6 (six) hours as needed. For pain.    Marland Kitchen NAPROXEN SODIUM 220 MG PO TABS Oral Take 220 mg by mouth 2 (two) times daily with a meal.    . OXYCODONE HCL ER 15 MG  PO TB12 Oral Take 15 mg by mouth every 12 (twelve) hours.    Marland Kitchen TIZANIDINE HCL 4 MG PO TABS Oral Take 4 mg by mouth every 6 (six) hours as needed. For muscle spasms      BP 123/86  Pulse 108  Temp 99 F (37.2 C) (Oral)  Resp 14  SpO2 98%  Physical Exam  Nursing note and vitals reviewed. Constitutional: She is oriented to person, place, and time. She appears well-developed and well-nourished. No distress.  HENT:  Head: Normocephalic.  Neck: Normal range of motion. Neck supple.       Some tenderness in posterior neck.  Cardiovascular: Normal rate and regular rhythm.   Pulmonary/Chest: Effort normal and breath sounds normal.  Musculoskeletal:       No appreciable swelling of left hand or arm.  Normal grip strength.  Normal radial pulses.  Normal ROM.  There is tenderness with light touch of entire left upper extremity.  Neurological: She is alert and oriented to person,  place, and time. She has normal strength. No sensory deficit.  Skin: Skin is warm and dry. No rash noted. No erythema.       No bite marks or signs for spider bite.  Psychiatric: She has a normal mood and affect. Her behavior is normal.    ED Course  Procedures     1. Left arm pain       MDM  10:40PM patient seen and evaluated. No appreciable swelling of left arm. Patient with reports of pain to light touch of the skin. There is no rash or blistering. No signs of insect bites. Erythematous streaks. No deformities. Patient does have history of spinal stenosis. And has history of similar symptoms in the neck and upper extremities. Patient also had prior carpal tunnels procedures in left arm. At this time do not suspect any concerning or emergent cause of symptoms. Patient is on pain medications at home. We'll provide swelling comfort. Patient will followup with PCP tomorrow for further evaluation.        Angus Seller, [place] 08/25/11 [phone]

## 2011-08-24 NOTE — ED Notes (Addendum)
Pt with hx of carpal tunnel surgery (many years ago) and chronic pain, to ED c/o L arm pain and and swelling since Sat.  She is worried she has a spider bite.  No obvious swelling, bites, or deformities.  Pt is febrile, but states she recently finished antibiotics for a URI.  Pt is due for an MRI to r/o "pinched nerve" to neck.  Pt has been out working in yard recently.

## 2011-08-24 NOTE — ED Notes (Signed)
Patient is able to walk, and is oriented x4.  Patient is complaining of pain level at a 5.  Discharge instructions were explained to the husband and the patient and no questions were asked.

## 2011-08-25 ENCOUNTER — Other Ambulatory Visit: Payer: Self-pay | Admitting: Orthopedic Surgery

## 2011-08-25 DIAGNOSIS — M542 Cervicalgia: Secondary | ICD-10-CM

## 2011-08-27 NOTE — ED Provider Notes (Signed)
Medical screening examination/treatment/procedure(s) were performed by non-physician practitioner and as supervising physician I was immediately available for consultation/collaboration.   Loren Racer, MD 08/27/11 [phone]

## 2011-08-28 ENCOUNTER — Other Ambulatory Visit: Payer: Self-pay | Admitting: Orthopedic Surgery

## 2011-08-28 DIAGNOSIS — M542 Cervicalgia: Secondary | ICD-10-CM

## 2011-09-04 ENCOUNTER — Ambulatory Visit
Admission: RE | Admit: 2011-09-04 | Discharge: 2011-09-04 | Disposition: A | Discharge status: Home / Self Care | Payer: Medicare Other | Source: Ambulatory Visit | Attending: Orthopedic Surgery | Admitting: Orthopedic Surgery

## 2011-09-04 VITALS — BP 133/76 | HR 98

## 2011-09-04 DIAGNOSIS — M542 Cervicalgia: Secondary | ICD-10-CM

## 2011-09-04 MED ORDER — ONDANSETRON HCL 4 MG/2ML IJ SOLN
4.0000 mg | Freq: Once | INTRAMUSCULAR | Status: AC
  Administered 2011-09-04: 4 mg via INTRAMUSCULAR

## 2011-09-04 MED ORDER — DIAZEPAM 5 MG PO TABS
10.0000 mg | ORAL_TABLET | Freq: Once | ORAL | Status: AC
  Administered 2011-09-04: 5 mg via ORAL

## 2011-09-04 MED ORDER — MORPHINE SULFATE 2 MG/ML IJ SOLN
2.0000 mg | Freq: Once | INTRAMUSCULAR | Status: AC
  Administered 2011-09-04: 2 mg via INTRAMUSCULAR

## 2011-09-04 MED ORDER — IOHEXOL 300 MG/ML  SOLN
10.0000 mL | Freq: Once | INTRAMUSCULAR | Status: AC | PRN
  Administered 2011-09-04: 10 mL via INTRATHECAL

## 2011-09-07 ENCOUNTER — Encounter (HOSPITAL_COMMUNITY): Payer: Self-pay | Admitting: *Deleted

## 2011-09-07 ENCOUNTER — Emergency Department (HOSPITAL_COMMUNITY)
Admission: EM | Admit: 2011-09-07 | Discharge: 2011-09-08 | Disposition: A | Discharge status: Home / Self Care | Payer: Medicare Other | Attending: Emergency Medicine | Admitting: Emergency Medicine

## 2011-09-07 DIAGNOSIS — F411 Generalized anxiety disorder: Secondary | ICD-10-CM | POA: Insufficient documentation

## 2011-09-07 DIAGNOSIS — R4182 Altered mental status, unspecified: Secondary | ICD-10-CM | POA: Insufficient documentation

## 2011-09-07 DIAGNOSIS — R451 Restlessness and agitation: Secondary | ICD-10-CM

## 2011-09-07 DIAGNOSIS — IMO0002 Reserved for concepts with insufficient information to code with codable children: Secondary | ICD-10-CM | POA: Insufficient documentation

## 2011-09-07 DIAGNOSIS — M549 Dorsalgia, unspecified: Secondary | ICD-10-CM | POA: Insufficient documentation

## 2011-09-07 LAB — URINALYSIS, ROUTINE W REFLEX MICROSCOPIC
Leukocytes, UA: NEGATIVE
Nitrite: NEGATIVE
Specific Gravity, Urine: 1.012 (ref 1.005–1.030)
Urobilinogen, UA: 1 mg/dL (ref 0.0–1.0)

## 2011-09-07 LAB — RAPID URINE DRUG SCREEN, HOSP PERFORMED
Amphetamines: NOT DETECTED
Barbiturates: NOT DETECTED
Tetrahydrocannabinol: NOT DETECTED

## 2011-09-07 LAB — POCT I-STAT, CHEM 8
Creatinine, Ser: 0.6 mg/dL (ref 0.50–1.10)
HCT: 43 % (ref 36.0–46.0)
Hemoglobin: 14.6 g/dL (ref 12.0–15.0)
Potassium: 3.8 mEq/L (ref 3.5–5.1)
Sodium: 143 mEq/L (ref 135–145)

## 2011-09-07 LAB — ETHANOL: Alcohol, Ethyl (B): <11 mg/dL (ref 0–11)

## 2011-09-07 MED ORDER — LORAZEPAM 1 MG PO TABS
1.0000 mg | ORAL_TABLET | Freq: Three times a day (TID) | ORAL | Status: DC | PRN
  Administered 2011-09-08: 1 mg via ORAL
  Filled 2011-09-07 (×3): qty 1

## 2011-09-07 MED ORDER — ONDANSETRON HCL 4 MG PO TABS: 4.0000 mg | ORAL_TABLET | Freq: Three times a day (TID) | ORAL | Status: DC | PRN

## 2011-09-07 MED ORDER — SODIUM CHLORIDE 0.9 % IJ SOLN
3.0000 mL | INTRAMUSCULAR | Status: AC
  Administered 2011-09-07: 3 mL via INTRAVENOUS

## 2011-09-07 MED ORDER — ZOLPIDEM TARTRATE 5 MG PO TABS
5.0000 mg | ORAL_TABLET | Freq: Every evening | ORAL | Status: DC | PRN
  Administered 2011-09-08: 5 mg via ORAL
  Filled 2011-09-07: qty 1

## 2011-09-07 MED ORDER — ALUM & MAG HYDROXIDE-SIMETH 200-200-20 MG/5ML PO SUSP: 30.0000 mL | ORAL | Status: DC | PRN

## 2011-09-07 MED ORDER — ACETAMINOPHEN 325 MG PO TABS: 650.0000 mg | ORAL_TABLET | ORAL | Status: DC | PRN

## 2011-09-07 MED ORDER — NICOTINE 21 MG/24HR TD PT24
21.0000 mg | MEDICATED_PATCH | Freq: Every day | TRANSDERMAL | Status: DC
  Administered 2011-09-08: 21 mg via TRANSDERMAL
  Filled 2011-09-07: qty 1

## 2011-09-07 NOTE — ED Notes (Signed)
Act team contacted for consult.

## 2011-09-07 NOTE — ED Notes (Signed)
Bed:WA04<BR> Expected date:<BR> Expected time:<BR> Means of arrival:<BR> Comments:<BR> EMS

## 2011-09-07 NOTE — ED Notes (Signed)
Per EMS: patient from home with c/o agitation and altered mental status. Husband sts patient is more childlike than normal, in and out of lucidity. Husband able to calm pt at times, others he agitates her. Patient increased meds a few days ago. Husband unable to clearly state what patient is supposed to be taking daily. Pt in NAD at this time.

## 2011-09-08 MED ORDER — ZIPRASIDONE MESYLATE 20 MG IM SOLR: 20.0000 mg | Freq: Once | INTRAMUSCULAR | Status: DC

## 2011-09-08 MED ORDER — TIZANIDINE HCL 4 MG PO TABS
4.0000 mg | ORAL_TABLET | Freq: Four times a day (QID) | ORAL | Status: DC | PRN
  Filled 2011-09-08: qty 1

## 2011-09-08 MED ORDER — ATORVASTATIN CALCIUM 10 MG PO TABS
5.0000 mg | ORAL_TABLET | Freq: Every day | ORAL | Status: DC
  Filled 2011-09-08: qty 0.5

## 2011-09-08 MED ORDER — OXYCODONE HCL 15 MG PO TB12: 15.0000 mg | ORAL_TABLET | Freq: Two times a day (BID) | ORAL | Status: DC

## 2011-09-08 MED ORDER — NAPROXEN SODIUM 220 MG PO TABS: 220.0000 mg | ORAL_TABLET | Freq: Two times a day (BID) | ORAL | Status: DC

## 2011-09-08 MED ORDER — ZIPRASIDONE MESYLATE 20 MG IM SOLR: 20.0000 mg | Freq: Once | INTRAMUSCULAR | Status: DC | PRN

## 2011-09-08 MED ORDER — NAPROXEN 500 MG PO TABS
250.0000 mg | ORAL_TABLET | Freq: Two times a day (BID) | ORAL | Status: DC
  Administered 2011-09-08: 250 mg via ORAL
  Filled 2011-09-08: qty 1

## 2011-09-08 MED ORDER — GABAPENTIN 300 MG PO CAPS
600.0000 mg | ORAL_CAPSULE | Freq: Four times a day (QID) | ORAL | Status: DC
  Administered 2011-09-08: 600 mg via ORAL
  Filled 2011-09-08 (×2): qty 2

## 2011-09-08 NOTE — ED Notes (Signed)
ACT team finished consulting with patient and husband.

## 2011-09-08 NOTE — ED Notes (Addendum)
Pt agitated at this time, stating she needs rest and to leave her alone. Pt pulling all electrodes, blood pressure cuff and O2 monitor off stating "I don't want or need any of this." Requesting Ambien at this time, stating she doesn't know who the man was at her bedside earlier, minutes later stating he was her boyfriend. Pt also talking about a husband as well, whom she sts is not the man from earlier. Pt denies any pain, HI/SI or other complaints at this time. Denies wanting Nicoderm patch as well. Pt also asking this RN to call her daughter and tell her to "bring me some Ambien. She'll know what I mean."

## 2011-09-08 NOTE — ED Notes (Addendum)
Pt demanding to speak with the president of the hospital at this time. When asked why she wants to speak with the president, she stated that she doesn't have her battery (to her nerve stimulator) with her and she wants someone to go get her battery or to be let go. Pt sts that her battery is a 10 year internal battery that she got replaced 7 months ago. Pt very demanding at this time. Offered patient pain medication, pt declined. Sts that she would like to speak with the charge nurse in place of the president of the hospital.

## 2011-09-08 NOTE — Consult Note (Signed)
Reason for Consult: Altered mental status chronic back pain medication changes history of bipolar disorder Referring Physician: Dr. Rory Percy Clair [name] is an 51 y.o. female.  HPI: Patient was seen and chart reviewed. Patient husband was visiting and briefly spoke with him. Reportedly patient has been agitated and has the altered mental status prior coming to hospital. Patient husband stated that she has improved her state since been in ER and he can easily talk to her without difficult. Staff RN reported the significant change in her mental status. Reportedly patient acting like a child and relational at home. Patient was unable to calm by her husband. Patient reported she has not seen a psychiatrist, not taking the medication for bipolar disorder for a while. Patient or her family was unable to provide where they had treatment in the past. Patient was known for fibromyalgia, mood disorder, and back surgery. Patient denied current symptoms of depression, anxiety, mania, and psychosis. Patient has denied current suicidal, homicidal ideation, intention, or plan. Patient has normal rate, rhythm, and low volume of speech. Her thought processes linear and goal-directed without loosening of associations or flight of ideations. Patient has no delusions, paranoia, or hallucinations. Patient is requesting to be discharged home, and will follow up with the outpatient treatment.  Past Medical History  Diagnosis Date  . Mood disorder   . GERD (gastroesophageal reflux disease)   . Chronic pain   . Back pain   . Diabetes mellitus   . Carpal tunnel syndrome   . Fibromyalgia   . Mitral valve prolapse     Past Surgical History  Procedure Date  . Mood disorder   . Spinal cord stimulator implant   . Abdominal hysterectomy   . Back surgery   . Carpal tunnel release     No family history on file.  Social History:  reports that she has been smoking.  She does not have any smokeless tobacco  history on file. She reports that she does not drink alcohol or use illicit drugs.  Allergies:  Allergies  Allergen Reactions  . Sulfa Antibiotics Anaphylaxis  . Ciprofloxacin Other (See Comments)    Muscle tightness  . Keflex (Cephalexin) Nausea And Vomiting  . Metoclopramide Hcl Other (See Comments)    Skin feels like its crawling  . Restoril Other (See Comments)    Skin feels like its crawling  . Risperidone Other (See Comments)    Skin feels like its crawling    Medications: I have reviewed the patient's current medications.  Results for orders placed during the hospital encounter of 09/07/11 (from the past 48 hour(s))  GLUCOSE, CAPILLARY     Status: Abnormal   Collection Time   09/07/11  9:54 PM      Component Value Range Comment   Glucose-Capillary 118 (*) 70 - 99 mg/dL    Comment 1 Notify RN     ETHANOL     Status: Normal   Collection Time   09/07/11 10:15 PM      Component Value Range Comment   Alcohol, Ethyl (B) <11  0 - 11 mg/dL   POCT I-STAT, CHEM 8     Status: Abnormal   Collection Time   09/07/11 10:38 PM      Component Value Range Comment   Sodium 143  135 - 145 mEq/L    Potassium 3.8  3.5 - 5.1 mEq/L    Chloride 109  96 - 112 mEq/L    BUN <3 (*) 6 - 23  mg/dL    Creatinine, Ser 4.54  0.50 - 1.10 mg/dL    Glucose, Bld 098 (*) 70 - 99 mg/dL    Calcium, Ion 1.19 (*) 1.12 - 1.23 mmol/L    TCO2 21  0 - 100 mmol/L    Hemoglobin 14.6  12.0 - 15.0 g/dL    HCT 14.7  82.9 - 56.2 %   URINE RAPID DRUG SCREEN (HOSP PERFORMED)     Status: Abnormal   Collection Time   09/07/11 10:50 PM      Component Value Range Comment   Opiates NONE DETECTED  NONE DETECTED    Cocaine NONE DETECTED  NONE DETECTED    Benzodiazepines POSITIVE (*) NONE DETECTED    Amphetamines NONE DETECTED  NONE DETECTED    Tetrahydrocannabinol NONE DETECTED  NONE DETECTED    Barbiturates NONE DETECTED  NONE DETECTED   URINALYSIS, ROUTINE W REFLEX MICROSCOPIC     Status: Normal   Collection Time    09/07/11 10:50 PM      Component Value Range Comment   Color, Urine YELLOW  YELLOW    APPearance CLEAR  CLEAR    Specific Gravity, Urine 1.012  1.005 - 1.030    pH 7.5  5.0 - 8.0    Glucose, UA NEGATIVE  NEGATIVE mg/dL    Hgb urine dipstick NEGATIVE  NEGATIVE    Bilirubin Urine NEGATIVE  NEGATIVE    Ketones, ur NEGATIVE  NEGATIVE mg/dL    Protein, ur NEGATIVE  NEGATIVE mg/dL    Urobilinogen, UA 1.0  0.0 - 1.0 mg/dL    Nitrite NEGATIVE  NEGATIVE    Leukocytes, UA NEGATIVE  NEGATIVE MICROSCOPIC NOT DONE ON URINES WITH NEGATIVE PROTEIN, BLOOD, LEUKOCYTES, NITRITE, OR GLUCOSE <1000 mg/dL.  GLUCOSE, CAPILLARY     Status: Abnormal   Collection Time   09/08/11  3:49 AM      Component Value Range Comment   Glucose-Capillary 110 (*) 70 - 99 mg/dL     No results found.  No depression, No psychosis and Positive for anxiety, sleep disturbance and back pain and stimulator was in place. ( Blood pressure 116/79, pulse 93, temperature 97.7 F (36.5 C), temperature source Oral, resp. rate 16, SpO2 99.00%.   Assessment/Plan: Bipolar disorder by history and altered mental status  Recommended outpatient psychiatric services and does not meet criteria for acute psychiatric hospitalization at this time. No medication changes during visit  Rebecca [name],Rebecca R. 09/08/2011, 5:52 PM  His normal daily D. current [facility] was, and is intermittently pertinent for inappropriate

## 2011-09-08 NOTE — ED Notes (Signed)
Pt's husband, Vernon's cell phone number is: [phone]

## 2011-09-08 NOTE — ED Notes (Signed)
Per Psych ED NT, pt. Had on pair of pants under her blue paper scrubs, NT states that pt. Stated to him that she was told in acute ED that she could wear her street pants on under her blue scrubs, pt. Gave NT her street pants and they were locked in pt.'s locker 35.

## 2011-09-08 NOTE — ED Notes (Signed)
Psych  Assessmt. Done at 0945.  Pt. States that she has not been on any bipolar meds for 2/3 years.

## 2011-09-08 NOTE — ED Notes (Signed)
Sitter at bedside.

## 2011-09-08 NOTE — ED Notes (Signed)
Rebecca [name], husband, leaving bedside.  [phone]. Call for any status changes.

## 2011-09-08 NOTE — ED Notes (Signed)
Pt is out of bed and trying to wander down the hall. Pt has been redirected multiple times to stay in room.

## 2011-09-08 NOTE — ED Provider Notes (Signed)
As per psychiatrist, Dr. Laban Emperor can be d/c home with psychiatric f/u. No HI or SI. No new medicines.   Richardean Canal, MD 09/08/11 [phone]

## 2011-09-08 NOTE — ED Notes (Signed)
Two patient belonging bags placed in locker 35 at [facility].  Report given to Psych ED

## 2011-09-08 NOTE — ED Notes (Signed)
ZOX:[id number]<AV> Expected date:<BR> Expected time:<BR> Means of arrival:<BR> Comments:<BR> Room 4

## 2011-09-08 NOTE — BH Assessment (Signed)
Assessment Note   Rebecca [name] is an 51 y.o. female who presents voluntarily to [facility] via EMS after daughter called EMS. Pt has been admitted to [facility] 7 times from 2002 to Oct 2012. Pt denies SI and HI. She denies AH & VH. No delusions noted. Her husband who is present states that pt's pain meds were changed 2 weeks ago. He is unable to state names or doses of meds. Pt reports euthymic mood. Her affect is blunted and she appears guarded. Pt is quiet and Location manager with short phrases. Pt's husband answers questions for pt.  Collateral info from daughter Burton Apley [phone] - Daughter states she called EMS b/c she thought pt wasn't acting "like herself" and that pt was displaying looseness of association in her answers to daughter's questions. pt has hx of bipolar d/o. She says pt has had multiple suicide attempts in past by overdose. Pt has decreased sleep (3 hrs/night) and has become increasingly fidgety with her hands. Pt has been isolating herself. She says pt began isolating herself like this last Oct before she went to [facility]. She reports pt has hx of AH. Pt has been off her psych meds for over one year. Daughter doesn't remember name of pt's former psychiatrist but says pt "did well" on Lithium for several years.   Axis I: Bipolar I Disorder Axis II: Deferred Axis III:  Past Medical History  Diagnosis Date  . Mood disorder   . GERD (gastroesophageal reflux disease)   . Chronic pain   . Back pain   . Diabetes mellitus   . Carpal tunnel syndrome   . Fibromyalgia   . Mitral valve prolapse    Axis IV: other psychosocial or environmental problems and problems related to social environment Axis V: 41-50 serious symptoms  Past Medical History:  Past Medical History  Diagnosis Date  . Mood disorder   . GERD (gastroesophageal reflux disease)   . Chronic pain   . Back pain   . Diabetes mellitus   . Carpal tunnel syndrome   . Fibromyalgia   . Mitral valve  prolapse     Past Surgical History  Procedure Date  . Mood disorder   . Spinal cord stimulator implant   . Abdominal hysterectomy   . Back surgery   . Carpal tunnel release     Family History: No family history on file.  Social History:  reports that she has been smoking.  She does not have any smokeless tobacco history on file. She reports that she does not drink alcohol or use illicit drugs.  Additional Social History:  Alcohol / Drug Use Pain Medications: pt denies abuse - see PTA meds list Prescriptions: see PTA meds list Over the Counter: na History of alcohol / drug use?: No history of alcohol / drug abuse  CIWA: CIWA-Ar BP: 120/86 mmHg Pulse Rate: 95  COWS:    Allergies:  Allergies  Allergen Reactions  . Sulfa Antibiotics Anaphylaxis  . Ciprofloxacin Other (See Comments)    Muscle tightness  . Keflex (Cephalexin) Nausea And Vomiting  . Metoclopramide Hcl Other (See Comments)    Skin feels like its crawling  . Restoril Other (See Comments)    Skin feels like its crawling  . Risperidone Other (See Comments)    Skin feels like its crawling    Home Medications:  (Not in a hospital admission)  OB/GYN Status:  No LMP recorded. Patient has had a hysterectomy.  General Assessment Data Location of  Assessment: [facility] ED Living Arrangements: Spouse/significant other Can pt return to current living arrangement?: Yes Admission Status: Voluntary Is patient capable of signing voluntary admission?: Yes Transfer from: Home Referral Source: Self/Family/Friend (daughter called EMS)  Education Status Is patient currently in school?: No Current Grade: na Highest grade of school patient has completed: 85 Name of school: [employer] Contact person: na  Risk to self Suicidal Ideation: No Suicidal Intent: No Is patient at risk for suicide?: No Suicidal Plan?: No Access to Means: No What has been your use of drugs/alcohol within the last 12 months?: pt denies use Previous  Attempts/Gestures: Yes (pt denies) How many times?:  (daughter states "multiple" attempts by overdose) Other Self Harm Risks: na Triggers for Past Attempts: Unknown Intentional Self Injurious Behavior: None Family Suicide History: No Recent stressful life event(s): Other (Comment) (recent eviction) Persecutory voices/beliefs?: No Depression: No Depression Symptoms: Isolating (pt denies but daughter sts pt is isolating) Substance abuse history and/or treatment for substance abuse?: No Suicide prevention information given to non-admitted patients: Not applicable  Risk to Others Homicidal Ideation: No Thoughts of Harm to Others: No Current Homicidal Intent: No Current Homicidal Plan: No Access to Homicidal Means: No Identified Victim: na History of harm to others?: No Assessment of Violence: None Noted Violent Behavior Description: pt calm and cooperative Does patient have access to weapons?: No Criminal Charges Pending?: No Does patient have a court date: No  Psychosis Hallucinations: None noted (pt denies currently/daughter sts pt has hx of AH) Delusions: None noted  Mental Status Report Appear/Hygiene: Disheveled Eye Contact: Fair Motor Activity: Freedom of movement;Unremarkable Speech: Logical/coherent;Slurred Level of Consciousness: Quiet/awake Mood: Other (Comment) (euthymic) Affect: Blunted Anxiety Level: None Thought Processes: Coherent;Relevant Judgement: Impaired Orientation: Situation;Time;Place;Person Obsessive Compulsive Thoughts/Behaviors: None  Cognitive Functioning Concentration: Decreased (pt reports normal concentration but daughter states decrease) Memory: Recent Intact;Remote Intact IQ: Average Insight: Poor Impulse Control: Fair Appetite: Fair Weight Loss: 0  Weight Gain: 0  Sleep: Decreased Total Hours of Sleep: 3  Vegetative Symptoms: None  ADLScreening [facility] Assessment Services) Patient's cognitive ability adequate to safely complete  daily activities?: Yes Patient able to express need for assistance with ADLs?: Yes Independently performs ADLs?: Yes (appropriate for developmental age)  Abuse/Neglect [facility]) Physical Abuse: Denies Verbal Abuse: Denies Sexual Abuse: Denies  Prior Inpatient Therapy Prior Inpatient Therapy: Yes Prior Therapy Dates: [facility] - 7 admits from 2002 to 2012 (most recent Oct 2012) Prior Therapy Facilty/Provider(s): [facility] twice - unknown dates Reason for Treatment: bipolar  Prior Outpatient Therapy Prior Outpatient Therapy: Yes Prior Therapy Dates: for several years until a few yrs ago Prior Therapy Facilty/Provider(s): provider name unknown Reason for Treatment: bipolar  ADL Screening (condition at time of admission) Patient's cognitive ability adequate to safely complete daily activities?: Yes Patient able to express need for assistance with ADLs?: Yes Independently performs ADLs?: Yes (appropriate for developmental age) Weakness of Legs: None Weakness of Arms/Hands: None  Home Assistive Devices/Equipment Home Assistive Devices/Equipment: None    Abuse/Neglect Assessment (Assessment to be complete while patient is alone) Physical Abuse: Denies Verbal Abuse: Denies Sexual Abuse: Denies Exploitation of patient/patient's resources: Denies Self-Neglect: Denies     Merchant navy officer (For Healthcare) Advance Directive: Patient does not have advance directive;Patient would not like information    Additional Information 1:1 In Past 12 Months?: No CIRT Risk: No Elopement Risk: No Does patient have medical clearance?: Yes     Disposition:  Disposition Disposition of Patient: Inpatient treatment program;Outpatient treatment Type of inpatient treatment program: Adult  Type of outpatient treatment: Adult  On Site Evaluation by:   Reviewed with Physician:     Donnamarie Rossetti P 09/08/2011 12:52 AM

## 2011-09-08 NOTE — [facility] Counselor (Signed)
Magistrate Bing Plume confirmed receipt of IVC paperwork including [employer]. Copies put in pt's chart and originals put in IVC log.

## 2011-09-26 ENCOUNTER — Telehealth: Payer: Self-pay | Admitting: *Deleted

## 2011-09-26 NOTE — Telephone Encounter (Signed)
Pt husband called requesting refill on depo provera shot. I called [phone] back and pt picked and I told her that she is overdue for her annual appointment. Pt was very "out of it" she was unable to tell me what her issue was. I told her to call back once her husband returned home. A lady name brandy call the front desk which seems to be pt caretaker, told me that she needs OV today due her taking to much medication, I told her this is not a GYN issue pt will need to go to ED to seek help. Gearldine Bienenstock apologized she thought they called the PCP office. I told her if pt has overdosed on medication please call 911 or take her to ED immediately. Gearldine Bienenstock said okay and will do this.

## 2011-12-09 ENCOUNTER — Emergency Department (HOSPITAL_COMMUNITY)
Admission: EM | Admit: 2011-12-09 | Discharge: 2011-12-09 | Disposition: A | Discharge status: Home / Self Care | Payer: Medicare Other | Attending: Emergency Medicine | Admitting: Emergency Medicine

## 2011-12-09 ENCOUNTER — Encounter (HOSPITAL_COMMUNITY): Payer: Self-pay | Admitting: *Deleted

## 2011-12-09 ENCOUNTER — Emergency Department (HOSPITAL_COMMUNITY): Payer: Medicare Other

## 2011-12-09 DIAGNOSIS — M549 Dorsalgia, unspecified: Secondary | ICD-10-CM

## 2011-12-09 DIAGNOSIS — E119 Type 2 diabetes mellitus without complications: Secondary | ICD-10-CM | POA: Insufficient documentation

## 2011-12-09 DIAGNOSIS — K219 Gastro-esophageal reflux disease without esophagitis: Secondary | ICD-10-CM | POA: Insufficient documentation

## 2011-12-09 DIAGNOSIS — M545 Low back pain, unspecified: Secondary | ICD-10-CM | POA: Insufficient documentation

## 2011-12-09 DIAGNOSIS — Z79899 Other long term (current) drug therapy: Secondary | ICD-10-CM | POA: Insufficient documentation

## 2011-12-09 DIAGNOSIS — G5600 Carpal tunnel syndrome, unspecified upper limb: Secondary | ICD-10-CM | POA: Insufficient documentation

## 2011-12-09 DIAGNOSIS — Z8659 Personal history of other mental and behavioral disorders: Secondary | ICD-10-CM | POA: Insufficient documentation

## 2011-12-09 DIAGNOSIS — J069 Acute upper respiratory infection, unspecified: Secondary | ICD-10-CM | POA: Insufficient documentation

## 2011-12-09 DIAGNOSIS — Z8679 Personal history of other diseases of the circulatory system: Secondary | ICD-10-CM | POA: Insufficient documentation

## 2011-12-09 DIAGNOSIS — G8929 Other chronic pain: Secondary | ICD-10-CM | POA: Insufficient documentation

## 2011-12-09 DIAGNOSIS — F17200 Nicotine dependence, unspecified, uncomplicated: Secondary | ICD-10-CM | POA: Insufficient documentation

## 2011-12-09 DIAGNOSIS — M25539 Pain in unspecified wrist: Secondary | ICD-10-CM | POA: Insufficient documentation

## 2011-12-09 DIAGNOSIS — R110 Nausea: Secondary | ICD-10-CM | POA: Insufficient documentation

## 2011-12-09 LAB — BASIC METABOLIC PANEL
BUN: 10 mg/dL (ref 6–23)
CO2: 22 mEq/L (ref 19–32)
Calcium: 9.4 mg/dL (ref 8.4–10.5)
Chloride: 107 mEq/L (ref 96–112)
Creatinine, Ser: 0.68 mg/dL (ref 0.50–1.10)

## 2011-12-09 LAB — CBC WITH DIFFERENTIAL/PLATELET
Basophils Absolute: 0 10*3/uL (ref 0.0–0.1)
Basophils Relative: 0 % (ref 0–1)
Eosinophils Relative: 1 % (ref 0–5)
HCT: 44.2 % (ref 36.0–46.0)
Hemoglobin: 15.5 g/dL — ABNORMAL HIGH (ref 12.0–15.0)
Lymphocytes Relative: 25 % (ref 12–46)
MCHC: 35.1 g/dL (ref 30.0–36.0)
MCV: 88 fL (ref 78.0–100.0)
Monocytes Absolute: 0.7 10*3/uL (ref 0.1–1.0)
Monocytes Relative: 8 % (ref 3–12)
Neutro Abs: 6.4 10*3/uL (ref 1.7–7.7)
RDW: 12.7 % (ref 11.5–15.5)

## 2011-12-09 MED ORDER — OXYCODONE-ACETAMINOPHEN 5-325 MG PO TABS
2.0000 | ORAL_TABLET | Freq: Once | ORAL | Status: AC
  Administered 2011-12-09: 2 via ORAL
  Filled 2011-12-09: qty 2

## 2011-12-09 MED ORDER — HYDROMORPHONE HCL PF 1 MG/ML IJ SOLN
1.0000 mg | Freq: Once | INTRAMUSCULAR | Status: AC
  Administered 2011-12-09: 1 mg via INTRAMUSCULAR
  Filled 2011-12-09: qty 1

## 2011-12-09 NOTE — ED Notes (Signed)
Pt states that she wants to go home with her husband because she has nowhere else to go. RN informed her that Social Work could assist her in finding a shelter for her to go to after discharge. Pt stated that she does not want to go to a shelter, nor talk to a Child psychotherapist. Pt did state that she was not ready to leave because of being in severe pain. Dr. Gregary Cromer notified.

## 2011-12-09 NOTE — ED Notes (Signed)
Patient reports she has been taken off all of her medications by her MD.  Patient states she has been having extreme pain.  She also reports onset of congestion and not feeling well on yesterday.  She has also had n/v

## 2011-12-09 NOTE — ED Notes (Signed)
Patient is also reporting dizziness.  Due to recent changes in meds and w/drawal sx,  Will changer her acuity to 3
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
## 2011-12-09 NOTE — ED Notes (Signed)
Pt reports having surgery a year ago September and was prescribed Vicodin. States that she changed doctors and that present doctor discontinued her vicodin "cold [place]" a week ago. States that she is going through withdrawal and her body hurts "everywhere". Pt rates pain 10/10 at this time.
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
## 2011-12-09 NOTE — ED Provider Notes (Signed)
I saw and evaluated the patient, reviewed the resident's note and I agree with the findings and plan.  Chronic pain in multiple areas, recently taken off meds by new PCP. Requesting referral to Pain Clinic.   Charles B. Bernette Mayers, MD 12/09/11 2248

## 2011-12-11 ENCOUNTER — Emergency Department (HOSPITAL_COMMUNITY)
Admission: EM | Admit: 2011-12-11 | Discharge: 2011-12-11 | Disposition: A | Discharge status: Home / Self Care | Payer: Medicare Other | Attending: Emergency Medicine | Admitting: Emergency Medicine

## 2011-12-11 ENCOUNTER — Encounter (HOSPITAL_COMMUNITY): Payer: Self-pay | Admitting: *Deleted

## 2011-12-11 DIAGNOSIS — F17200 Nicotine dependence, unspecified, uncomplicated: Secondary | ICD-10-CM | POA: Insufficient documentation

## 2011-12-11 DIAGNOSIS — M549 Dorsalgia, unspecified: Secondary | ICD-10-CM | POA: Insufficient documentation

## 2011-12-11 DIAGNOSIS — IMO0001 Reserved for inherently not codable concepts without codable children: Secondary | ICD-10-CM | POA: Insufficient documentation

## 2011-12-11 DIAGNOSIS — Z8679 Personal history of other diseases of the circulatory system: Secondary | ICD-10-CM | POA: Insufficient documentation

## 2011-12-11 DIAGNOSIS — G8929 Other chronic pain: Secondary | ICD-10-CM | POA: Insufficient documentation

## 2011-12-11 DIAGNOSIS — Z79899 Other long term (current) drug therapy: Secondary | ICD-10-CM | POA: Insufficient documentation

## 2011-12-11 DIAGNOSIS — Z8659 Personal history of other mental and behavioral disorders: Secondary | ICD-10-CM | POA: Insufficient documentation

## 2011-12-11 HISTORY — DX: Attention-deficit hyperactivity disorder, unspecified type: F90.9

## 2011-12-11 MED ORDER — HYDROMORPHONE HCL PF 1 MG/ML IJ SOLN
1.0000 mg | Freq: Once | INTRAMUSCULAR | Status: AC
  Administered 2011-12-11: 1 mg via INTRAMUSCULAR
  Filled 2011-12-11: qty 1

## 2011-12-11 MED ORDER — KETOROLAC TROMETHAMINE 60 MG/2ML IM SOLN
60.0000 mg | Freq: Once | INTRAMUSCULAR | Status: AC
  Administered 2011-12-11: 60 mg via INTRAMUSCULAR
  Filled 2011-12-11: qty 2

## 2011-12-11 NOTE — ED Notes (Signed)
Spoke with pt's ride, stated they were on the way.  EDP [place]itz notified, obtained verbal order for 1mg  of dilaudid IM.

## 2011-12-11 NOTE — ED Notes (Signed)
Instructed pt that she needs to find a ride home prior to [name] giving her any narcotics.

## 2011-12-11 NOTE — ED Provider Notes (Signed)
History     CSN: [id number]  Arrival date & time 12/11/11  0350   First MD Initiated Contact with Patient 12/11/11 0413      No chief complaint on file.   (Consider location/radiation/quality/duration/timing/severity/associated sxs/prior treatment) HPI History provided by patient. Has fibromyalgia and chronic back pain followed by Dr. Wonda Horner orthopedics. Patient was seen here last night for back pain. Tonight she states her pain is worse it was last night is 10+ out of 10. She has I nerve stimulator that she feels is not working and she believes is not fully charged,. She tells me that she has notified the company tonight and is waiting to her back from a company representative. No trauma. No new weakness or numbness. No new symptoms with severe sharp pain post multiple legs. Pain worse tonight at home despite taking ibuprofen and Neurontin. No fevers. is not diabetic. No recent surgery. Patient drove herself to the emergency department.   Past Medical History  Diagnosis Date  . ADHD (attention deficit hyperactivity disorder)   . Fibromyalgia   . Mitral valve prolapse     Past Surgical History  Procedure Date  . Neuro stimulator     Pt had neuro stiumlator implated in back     No family history on file.  History  Substance Use Topics  . Smoking status: Current Every Day Smoker -- 1.0 packs/day    Types: Cigarettes  . Smokeless tobacco: Not on file  . Alcohol Use: No    OB History    Grav Para Term Preterm Abortions TAB SAB Ect Mult Living                  Review of Systems  Constitutional: Negative for fever and chills.  HENT: Negative for neck pain and neck stiffness.   Eyes: Negative for pain.  Respiratory: Negative for shortness of breath.   Cardiovascular: Negative for chest pain.  Gastrointestinal: Negative for abdominal pain.  Genitourinary: Negative for dysuria.  Musculoskeletal: Positive for back pain.  Skin: Negative for rash.  Neurological:  Negative for headaches.  All other systems reviewed and are negative.    Allergies  Ciprofloxacin hcl; Sulfa antibiotics; Ativan; and Latex  Home Medications   Current Outpatient Rx  Name  Route  Sig  Dispense  Refill  . CEPHALEXIN 250 MG PO CAPS   Oral   Take 250 mg by mouth 2 (two) times daily. For uti Begin 12/10/11 and take for 5 days         . DIAZEPAM 10 MG PO TABS   Oral   Take 10 mg by mouth every 8 (eight) hours. To prevent panic attacks         . GABAPENTIN 300 MG PO CAPS   Oral   Take 300 mg by mouth 3 (three) times daily.         . IBUPROFEN 800 MG PO TABS   Oral   Take 800 mg by mouth every 6 (six) hours as needed. pain         . NAPROXEN SODIUM 220 MG PO TABS   Oral   Take 220 mg by mouth 2 (two) times daily as needed. headache         . ZOLPIDEM TARTRATE 10 MG PO TABS   Oral   Take 10 mg by mouth at bedtime as needed. For sleep           BP 146/95  Pulse 108  Temp 97.8 F (36.6  C) (Oral)  Resp 20  SpO2 100%  Physical Exam  Nursing note and vitals reviewed. Constitutional: She is oriented to person, place, and time. She appears well-developed and well-nourished.  HENT:  Head: Normocephalic and atraumatic.  Eyes: EOM are normal. Pupils are equal, round, and reactive to light.  Neck: Neck supple.  Cardiovascular: Normal heart sounds and intact distal pulses.   Pulmonary/Chest: Effort normal. No respiratory distress.  Musculoskeletal: She exhibits no edema.       Tender lower lumbar without deformity. No erythema or fluctuance. Decreased range of motion to bilateral extremities secondary to pain. Distal motor and sensorium to light touch is intact. Equal lower extremity DTRs intact. Gait intact.  Neurological: She is alert and oriented to person, place, and time.  Skin: Skin is warm and dry.    ED Course  Procedures (including critical care time)  IM toradol  Patient is able to call for ride who presented bedside. IM Dilaudid  provided. Pain improving plan discharge home to followup with her orthopedic surgeon today for further evaluation of her neurostimulator.   MDM   Chronic pain with multiple ED visits. Patient here last night with the same. No red flags or indication for emergent imaging-  Pain improving and stable for discharge home and outpatient followup. Vital signs nursing notes reviewed and considered.        Sunnie Nielsen, MD 12/11/11 [phone]

## 2011-12-11 NOTE — ED Notes (Signed)
Pt to be discharged home. Husband at the bedside. States "I feel much better." no signs of distress noted.
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
## 2011-12-11 NOTE — ED Notes (Signed)
Pt discharged home. Husband at the bedside. Ambulatory to discharge.

## 2011-12-12 ENCOUNTER — Encounter (HOSPITAL_COMMUNITY): Payer: Self-pay | Admitting: *Deleted

## 2011-12-17 ENCOUNTER — Encounter: Payer: Self-pay | Admitting: Gynecology

## 2011-12-17 ENCOUNTER — Other Ambulatory Visit (HOSPITAL_COMMUNITY)
Admission: RE | Admit: 2011-12-17 | Discharge: 2011-12-17 | Disposition: A | Discharge status: Home / Self Care | Payer: Medicare Other | Source: Ambulatory Visit | Attending: Gynecology | Admitting: Gynecology

## 2011-12-17 ENCOUNTER — Ambulatory Visit (INDEPENDENT_AMBULATORY_CARE_PROVIDER_SITE_OTHER): Payer: Medicare Other | Admitting: Gynecology

## 2011-12-17 VITALS — BP 128/78 | Ht 62.0 in | Wt 164.0 lb

## 2011-12-17 DIAGNOSIS — Z124 Encounter for screening for malignant neoplasm of cervix: Secondary | ICD-10-CM

## 2011-12-17 DIAGNOSIS — R87810 Cervical high risk human papillomavirus (HPV) DNA test positive: Secondary | ICD-10-CM | POA: Insufficient documentation

## 2011-12-17 DIAGNOSIS — N949 Unspecified condition associated with female genital organs and menstrual cycle: Secondary | ICD-10-CM

## 2011-12-17 DIAGNOSIS — R635 Abnormal weight gain: Secondary | ICD-10-CM

## 2011-12-17 DIAGNOSIS — R102 Pelvic and perineal pain: Secondary | ICD-10-CM

## 2011-12-17 DIAGNOSIS — Z01419 Encounter for gynecological examination (general) (routine) without abnormal findings: Secondary | ICD-10-CM

## 2011-12-17 DIAGNOSIS — Z1272 Encounter for screening for malignant neoplasm of vagina: Secondary | ICD-10-CM

## 2011-12-17 DIAGNOSIS — Z1151 Encounter for screening for human papillomavirus (HPV): Secondary | ICD-10-CM | POA: Insufficient documentation

## 2011-12-17 MED ORDER — MEDROXYPROGESTERONE ACETATE 150 MG/ML IM SUSP
150.0000 mg | Freq: Once | INTRAMUSCULAR | Status: AC
  Administered 2011-12-17: 150 mg via INTRAMUSCULAR

## 2011-12-17 MED ORDER — MEDROXYPROGESTERONE ACETATE 150 MG/ML IM SUSP: 150.0000 mg | INTRAMUSCULAR | Status: DC

## 2011-12-17 NOTE — Patient Instructions (Addendum)
Smoking Cessation Quitting smoking is important to your health and has many advantages. However, it is not always easy to quit since nicotine is a very addictive drug. Often times, people try 3 times or more before being able to quit. This document explains the best ways for you to prepare to quit smoking. Quitting takes hard work and a lot of effort, but you can do it. ADVANTAGES OF QUITTING SMOKING  You will live longer, feel better, and live better.  Your body will feel the impact of quitting smoking almost immediately.  Within 20 minutes, blood pressure decreases. Your pulse returns to its normal level.  After 8 hours, carbon monoxide levels in the blood return to normal. Your oxygen level increases.  After 24 hours, the chance of having a heart attack starts to decrease. Your breath, hair, and body stop smelling like smoke.  After 48 hours, damaged nerve endings begin to recover. Your sense of taste and smell improve.  After 72 hours, the body is virtually free of nicotine. Your bronchial tubes relax and breathing becomes easier.  After 2 to 12 weeks, lungs can hold more air. Exercise becomes easier and circulation improves.  The risk of having a heart attack, stroke, cancer, or lung disease is greatly reduced.  After 1 year, the risk of coronary heart disease is cut in half.  After 5 years, the risk of stroke falls to the same as a nonsmoker.  After 10 years, the risk of lung cancer is cut in half and the risk of other cancers decreases significantly.  After 15 years, the risk of coronary heart disease drops, usually to the level of a nonsmoker.  If you are pregnant, quitting smoking will improve your chances of having a healthy baby.  The people you live with, especially any children, will be healthier.  You will have extra money to spend on things other than cigarettes. QUESTIONS TO THINK ABOUT BEFORE ATTEMPTING TO QUIT You may want to talk about your answers with your  caregiver.  Why do you want to quit?  If you tried to quit in the past, what helped and what did not?  What will be the most difficult situations for you after you quit? How will you plan to handle them?  Who can help you through the tough times? Your family? Friends? A caregiver?  What pleasures do you get from smoking? What ways can you still get pleasure if you quit? Here are some questions to ask your caregiver:  How can you help me to be successful at quitting?  What medicine do you think would be best for me and how should I take it?  What should I do if I need more help?  What is smoking withdrawal like? How can I get information on withdrawal? GET READY  Set a quit date.  Change your environment by getting rid of all cigarettes, ashtrays, matches, and lighters in your home, car, or work. Do not let people smoke in your home.  Review your past attempts to quit. Think about what worked and what did not. GET SUPPORT AND ENCOURAGEMENT You have a better chance of being successful if you have help. You can get support in many ways.  Tell your family, friends, and co-workers that you are going to quit and need their support. Ask them not to smoke around you.  Get individual, group, or telephone counseling and support. Programs are available at local hospitals and health centers. Call your local health department for   information about programs in your area.  Spiritual beliefs and practices may help some smokers quit.  Download a "quit meter" on your computer to keep track of quit statistics, such as how long you have gone without smoking, cigarettes not smoked, and money saved.  Get a self-help book about quitting smoking and staying off of tobacco. LEARN NEW SKILLS AND BEHAVIORS  Distract yourself from urges to smoke. Talk to someone, go for a walk, or occupy your time with a task.  Change your normal routine. Take a different route to work. Drink tea instead of coffee.  Eat breakfast in a different place.  Reduce your stress. Take a hot bath, exercise, or read a book.  Plan something enjoyable to do every day. Reward yourself for not smoking.  Explore interactive web-based programs that specialize in helping you quit. GET MEDICINE AND USE IT CORRECTLY Medicines can help you stop smoking and decrease the urge to smoke. Combining medicine with the above behavioral methods and support can greatly increase your chances of successfully quitting smoking.  Nicotine replacement therapy helps deliver nicotine to your body without the negative effects and risks of smoking. Nicotine replacement therapy includes nicotine gum, lozenges, inhalers, nasal sprays, and skin patches. Some may be available over-the-counter and others require a prescription.  Antidepressant medicine helps people abstain from smoking, but how this works is unknown. This medicine is available by prescription.  Nicotinic receptor partial agonist medicine simulates the effect of nicotine in your brain. This medicine is available by prescription. Ask your caregiver for advice about which medicines to use and how to use them based on your health history. Your caregiver will tell you what side effects to look out for if you choose to be on a medicine or therapy. Carefully read the information on the package. Do not use any other product containing nicotine while using a nicotine replacement product.  RELAPSE OR DIFFICULT SITUATIONS Most relapses occur within the first 3 months after quitting. Do not be discouraged if you start smoking again. Remember, most people try several times before finally quitting. You may have symptoms of withdrawal because your body is used to nicotine. You may crave cigarettes, be irritable, feel very hungry, cough often, get headaches, or have difficulty concentrating. The withdrawal symptoms are only temporary. They are strongest when you first quit, but they will go away within  10 14 days. To reduce the chances of relapse, try to:  Avoid drinking alcohol. Drinking lowers your chances of successfully quitting.  Reduce the amount of caffeine you consume. Once you quit smoking, the amount of caffeine in your body increases and can give you symptoms, such as a rapid heartbeat, sweating, and anxiety.  Avoid smokers because they can make you want to smoke.  Do not let weight gain distract you. Many smokers will gain weight when they quit, usually less than 10 pounds. Eat a healthy diet and stay active. You can always lose the weight gained after you quit.  Find ways to improve your mood other than smoking. FOR MORE INFORMATION  www.smokefree.gov  Document Released: 12/31/2000 Document Revised: 07/08/2011 Document Reviewed: 04/17/2011 [facility] Patient Information 2013 [place], [place].                                                  Cholesterol Control Diet  Cholesterol levels in your body  are determined significantly by your diet. Cholesterol levels may also be related to heart disease. The following material helps to explain this relationship and discusses what you can do to help keep your heart healthy. Not all cholesterol is bad. Low-density lipoprotein (LDL) cholesterol is the "bad" cholesterol. It may cause fatty deposits to build up inside your arteries. High-density lipoprotein (HDL) cholesterol is "good." It helps to remove the "bad" LDL cholesterol from your blood. Cholesterol is a very important risk factor for heart disease. Other risk factors are high blood pressure, smoking, stress, heredity, and weight. The heart muscle gets its supply of blood through the coronary arteries. If your LDL cholesterol is high and your HDL cholesterol is low, you are at risk for having fatty deposits build up in your coronary arteries. This leaves less room through which blood can flow. Without sufficient blood and oxygen, the heart muscle cannot function properly and you may feel  chest pains (angina pectoris). When a coronary artery closes up entirely, a part of the heart muscle may die, causing a heart attack (myocardial infarction). CHECKING CHOLESTEROL When your caregiver sends your blood to a lab to be analyzed for cholesterol, a complete lipid (fat) profile may be done. With this test, the total amount of cholesterol and levels of LDL and HDL are determined. Triglycerides are a type of fat that circulates in the blood and can also be used to determine heart disease risk. The list below describes what the numbers should be: Test: Total Cholesterol.  Less than 200 mg/dl.  Test: LDL "bad cholesterol."  Less than 100 mg/dl.   Less than 70 mg/dl if you are at very high risk of a heart attack or sudden cardiac death.  Test: HDL "good cholesterol."  Greater than 50 mg/dl for women.   Greater than 40 mg/dl for men.  Test: Triglycerides.  Less than 150 mg/dl.  CONTROLLING CHOLESTEROL WITH DIET Although exercise and lifestyle factors are important, your diet is key. That is because certain foods are known to raise cholesterol and others to lower it. The goal is to balance foods for their effect on cholesterol and more importantly, to replace saturated and trans fat with other types of fat, such as monounsaturated fat, polyunsaturated fat, and omega-3 fatty acids. On average, a person should consume no more than 15 to 17 g of saturated fat daily. Saturated and trans fats are considered "bad" fats, and they will raise LDL cholesterol. Saturated fats are primarily found in animal products such as meats, butter, and cream. However, that does not mean you need to sacrifice all your favorite foods. Today, there are good tasting, low-fat, low-cholesterol substitutes for most of the things you like to eat. Choose low-fat or nonfat alternatives. Choose round or loin cuts of red meat, since these types of cuts are lowest in fat and cholesterol. Chicken (without the skin), fish, veal,  and ground [place] breast are excellent choices. Eliminate fatty meats, such as hot dogs and salami. Even shellfish have little or no saturated fat. Have a 3 oz (85 g) portion when you eat lean meat, poultry, or fish. Trans fats are also called "partially hydrogenated oils." They are oils that have been scientifically manipulated so that they are solid at room temperature resulting in a longer shelf life and improved taste and texture of foods in which they are added. Trans fats are found in stick margarine, some tub margarines, cookies, crackers, and baked goods.  When baking and cooking, oils are an excellent  substitute for butter. The monounsaturated oils are especially beneficial since it is believed they lower LDL and raise HDL. The oils you should avoid entirely are saturated tropical oils, such as coconut and palm.  Remember to eat liberally from food groups that are naturally free of saturated and trans fat, including fish, fruit, vegetables, beans, grains (barley, rice, couscous, bulgur wheat), and pasta (without cream sauces).  IDENTIFYING FOODS THAT LOWER CHOLESTEROL  Soluble fiber may lower your cholesterol. This type of fiber is found in fruits such as apples, vegetables such as broccoli, potatoes, and carrots, legumes such as beans, peas, and lentils, and grains such as barley. Foods fortified with plant sterols (phytosterol) may also lower cholesterol. You should eat at least 2 g per day of these foods for a cholesterol lowering effect.  Read package labels to identify low-saturated fats, trans fats free, and low-fat foods at the supermarket. Select cheeses that have only 2 to 3 g saturated fat per ounce. Use a heart-healthy tub margarine that is free of trans fats or partially hydrogenated oil. When buying baked goods (cookies, crackers), avoid partially hydrogenated oils. Breads and muffins should be made from whole grains (whole-wheat or whole oat flour, instead of "flour" or "enriched  flour"). Buy non-creamy canned soups with reduced salt and no added fats.  FOOD PREPARATION TECHNIQUES  Never deep-fry. If you must fry, either stir-fry, which uses very little fat, or use non-stick cooking sprays. When possible, broil, bake, or roast meats, and steam vegetables. Instead of dressing vegetables with butter or margarine, use lemon and herbs, applesauce and cinnamon (for squash and sweet potatoes), nonfat yogurt, salsa, and low-fat dressings for salads.  LOW-SATURATED FAT / LOW-FAT FOOD SUBSTITUTES Meats / Saturated Fat (g)  Avoid: Steak, marbled (3 oz/85 g) / 11 g   Choose: Steak, lean (3 oz/85 g) / 4 g   Avoid: Hamburger (3 oz/85 g) / 7 g   Choose: Hamburger, lean (3 oz/85 g) / 5 g   Avoid: Ham (3 oz/85 g) / 6 g   Choose: Ham, lean cut (3 oz/85 g) / 2.4 g   Avoid: Chicken, with skin, dark meat (3 oz/85 g) / 4 g   Choose: Chicken, skin removed, dark meat (3 oz/85 g) / 2 g   Avoid: Chicken, with skin, light meat (3 oz/85 g) / 2.5 g   Choose: Chicken, skin removed, light meat (3 oz/85 g) / 1 g  Dairy / Saturated Fat (g)  Avoid: Whole milk (1 cup) / 5 g   Choose: Low-fat milk, 2% (1 cup) / 3 g   Choose: Low-fat milk, 1% (1 cup) / 1.5 g   Choose: Skim milk (1 cup) / 0.3 g   Avoid: Hard cheese (1 oz/28 g) / 6 g   Choose: Skim milk cheese (1 oz/28 g) / 2 to 3 g   Avoid: Cottage cheese, 4% fat (1 cup) / 6.5 g   Choose: Low-fat cottage cheese, 1% fat (1 cup) / 1.5 g   Avoid: Ice cream (1 cup) / 9 g   Choose: Sherbet (1 cup) / 2.5 g   Choose: Nonfat frozen yogurt (1 cup) / 0.3 g   Choose: Frozen fruit bar / trace   Avoid: Whipped cream (1 tbs) / 3.5 g   Choose: Nondairy whipped topping (1 tbs) / 1 g  Condiments / Saturated Fat (g)  Avoid: Mayonnaise (1 tbs) / 2 g   Choose: Low-fat mayonnaise (1 tbs) / 1 g   Avoid:  Butter (1 tbs) / 7 g   Choose: Extra light margarine (1 tbs) / 1 g   Avoid: Coconut oil (1 tbs) / 11.8 g   Choose: Olive oil (1 tbs)  / 1.8 g   Choose: Corn oil (1 tbs) / 1.7 g   Choose: Safflower oil (1 tbs) / 1.2 g   Choose: Sunflower oil (1 tbs) / 1.4 g   Choose: Soybean oil (1 tbs) / 2.4 g   Choose: Canola oil (1 tbs) / 1 g  Document Released: 01/06/2005 Document Revised: 09/18/2010 Document Reviewed: 06/27/2010 [facility] Patient Information 2012 [place], [place].  Exercise to Lose Weight Exercise and a healthy diet may help you lose weight. Your doctor may suggest specific exercises. EXERCISE IDEAS AND TIPS  Choose low-cost things you enjoy doing, such as walking, bicycling, or exercising to workout videos.   Take stairs instead of the elevator.   Walk during your lunch break.   Park your car further away from work or school.   Go to a gym or an exercise class.   Start with 5 to 10 minutes of exercise each day. Build up to 30 minutes of exercise 4 to 6 days a week.   Wear shoes with good support and comfortable clothes.   Stretch before and after working out.   Work out until you breathe harder and your heart beats faster.   Drink extra water when you exercise.   Do not do so much that you hurt yourself, feel dizzy, or get very short of breath.  Exercises that burn about 150 calories:  Running 1  miles in 15 minutes.   Playing volleyball for 45 to 60 minutes.   Washing and waxing a car for 45 to 60 minutes.   Playing touch football for 45 minutes.   Walking 1  miles in 35 minutes.   Pushing a stroller 1  miles in 30 minutes.   Playing basketball for 30 minutes.   Raking leaves for 30 minutes.   Bicycling 5 miles in 30 minutes.   Walking 2 miles in 30 minutes.   Dancing for 30 minutes.   Shoveling snow for 15 minutes.   Swimming laps for 20 minutes.   Walking up stairs for 15 minutes.   Bicycling 4 miles in 15 minutes.   Gardening for 30 to 45 minutes.   Jumping rope for 15 minutes.   Washing windows or floors for 45 to 60 minutes.  Document Released: 02/08/2010  Document Revised: 09/18/2010 Document Reviewed: 02/08/2010 [facility] Patient Information 2012 [place], [place].

## 2011-12-17 NOTE — Progress Notes (Addendum)
Rebecca [name] 01-21-1960 [id number]   History:    51 y.o.  for annual gyn exam with no major complaints. Patient had been started on Depo-Provera 150 mg IM every 3 months last year for suspected endometriosis because of pelvic pain.It been several months since  she received Depo-Provera and was here today to receive her dose and for refills. . Review of her record indicated she is overdue for mammogram the last was done in 2011. Her colonoscopy was in 2009 was benign. She has history of bipolar disorder, type 2 diabetes, hypercholesterolemia, mitral valve prolapse, glaucoma and cataracts and passage of kidney stones for which she has been followed by her primary physician's. She does have a history of transvaginal hysterectomy as well as left salpingo-oophorectomy in the past.   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  Gynecologic History No LMP recorded. Patient has had a hysterectomy. Contraception: status post hysterectomy Last Pap: 2012. Results were: normal Last mammogram: 2011. Results were: normal  Obstetric History OB History    Grav Para Term Preterm Abortions TAB SAB Ect Mult Living   4 4 4       4      # Outc Date GA Lbr Len/2nd Wgt Sex Del Anes PTL Lv   1 TRM     F SVD  No Yes   2 TRM     M SVD  No Yes   3 TRM     M SVD  No Yes   4 TRM     M SVD  No Yes       ROS: A ROS was performed and pertinent positives and negatives are included in the history.  GENERAL: No fevers or chills. HEENT: No change in vision, no  earache, sore throat or sinus congestion. NECK: No pain or stiffness. CARDIOVASCULAR: No chest pain or pressure. No palpitations. PULMONARY: No shortness of breath, cough or wheeze. GASTROINTESTINAL: No abdominal pain, nausea, vomiting or diarrhea, melena or bright red blood per rectum. GENITOURINARY: No urinary frequency, urgency, hesitancy or dysuria. MUSCULOSKELETAL: No joint or muscle pain, no back pain, no recent trauma. DERMATOLOGIC: No rash, no itching, no lesions. ENDOCRINE: No polyuria, polydipsia, no heat or cold intolerance. No recent change in weight. HEMATOLOGICAL: No anemia or easy bruising or bleeding. NEUROLOGIC: No headache, seizures, numbness, tingling or weakness. PSYCHIATRIC: No depression, no loss of interest in normal activity or change in sleep pattern.     Exam: chaperone present  BP 128/78  Ht 5\' 2"  (1.575 m)  Wt 164 lb (74.39 kg)  BMI 30.00 kg/m2  Body mass index is 30.00 kg/(m^2).  General appearance : Well developed well nourished female. No acute distress HEENT: Neck supple, trachea midline, no carotid bruits, no thyroidmegaly Lungs: Clear to auscultation, no rhonchi or wheezes, or rib retractions  Heart: Regular rate and rhythm, no murmurs or gallops Breast:Examined in sitting and supine position were symmetrical in appearance, no palpable masses or tenderness,  no skin retraction, no nipple inversion, no nipple discharge, no skin discoloration, no  axillary or supraclavicular lymphadenopathy Abdomen: no palpable masses or tenderness, no rebound or guarding Extremities: no edema or skin discoloration or tenderness  Pelvic:  Bartholin, Urethra, Skene Glands: Within normal limits             Vagina: No gross lesions or discharge  Cervix: Absent  Uterus Absent  Adnexa  Without masses or tenderness  Anus and perineum  normal   Rectovaginal  normal sphincter tone without palpated masses or tenderness             Hemoccult cards provided   Assessment/Plan:  51  y.o. 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 On thorough inspection there was no evidence of any gross lesions. I am going to recommend she return back to the office next month for a thorough colposcopic evaluation the external genitalia. Pap smear and TSH was done today since the other labs were done by her primary physician we will also check her urinalysis as well. She was given a requisition to schedule her mammogram. She was encouraged to do her monthly self breast examination. Hemoccult cards were provided for her to submit to the office for testing. She received Depo-Provera 150 mg IM. She will continue every 3 months. I've explained her that we will need to stop in 2 years. I'm concerned about her possibly losing bone since she smokes one pack cigarette per day. We did discuss the detrimental effects of smoking.  Ok Edwards MD, 5:15 PM 12/17/2011

## 2011-12-18 LAB — URINALYSIS W MICROSCOPIC + REFLEX CULTURE
Bacteria, UA: NONE SEEN
Bilirubin Urine: NEGATIVE
Casts: NONE SEEN
Crystals: NONE SEEN
Hgb urine dipstick: NEGATIVE
Ketones, ur: NEGATIVE mg/dL
Specific Gravity, Urine: 1.013 (ref 1.005–1.030)
pH: 7 (ref 5.0–8.0)

## 2011-12-18 LAB — TSH: TSH: 0.433 u[IU]/mL (ref 0.350–4.500)

## 2011-12-22 ENCOUNTER — Encounter: Payer: Self-pay | Admitting: Gynecology

## 2011-12-29 ENCOUNTER — Emergency Department (HOSPITAL_COMMUNITY)
Admission: EM | Admit: 2011-12-29 | Discharge: 2011-12-29 | Disposition: A | Discharge status: Home / Self Care | Payer: Medicare Other | Attending: Emergency Medicine | Admitting: Emergency Medicine

## 2011-12-29 ENCOUNTER — Encounter (HOSPITAL_COMMUNITY): Payer: Self-pay | Admitting: Emergency Medicine

## 2011-12-29 DIAGNOSIS — Z8719 Personal history of other diseases of the digestive system: Secondary | ICD-10-CM | POA: Insufficient documentation

## 2011-12-29 DIAGNOSIS — F17200 Nicotine dependence, unspecified, uncomplicated: Secondary | ICD-10-CM | POA: Insufficient documentation

## 2011-12-29 DIAGNOSIS — Z79899 Other long term (current) drug therapy: Secondary | ICD-10-CM | POA: Insufficient documentation

## 2011-12-29 DIAGNOSIS — IMO0001 Reserved for inherently not codable concepts without codable children: Secondary | ICD-10-CM | POA: Insufficient documentation

## 2011-12-29 DIAGNOSIS — Z8679 Personal history of other diseases of the circulatory system: Secondary | ICD-10-CM | POA: Insufficient documentation

## 2011-12-29 DIAGNOSIS — F39 Unspecified mood [affective] disorder: Secondary | ICD-10-CM | POA: Insufficient documentation

## 2011-12-29 DIAGNOSIS — Z8659 Personal history of other mental and behavioral disorders: Secondary | ICD-10-CM | POA: Insufficient documentation

## 2011-12-29 DIAGNOSIS — N390 Urinary tract infection, site not specified: Secondary | ICD-10-CM | POA: Insufficient documentation

## 2011-12-29 DIAGNOSIS — Z8739 Personal history of other diseases of the musculoskeletal system and connective tissue: Secondary | ICD-10-CM | POA: Insufficient documentation

## 2011-12-29 DIAGNOSIS — R300 Dysuria: Secondary | ICD-10-CM | POA: Insufficient documentation

## 2011-12-29 DIAGNOSIS — E119 Type 2 diabetes mellitus without complications: Secondary | ICD-10-CM | POA: Insufficient documentation

## 2011-12-29 LAB — URINALYSIS, ROUTINE W REFLEX MICROSCOPIC
Ketones, ur: NEGATIVE mg/dL
Nitrite: POSITIVE — AB
Protein, ur: NEGATIVE mg/dL
Urobilinogen, UA: 1 mg/dL (ref 0.0–1.0)

## 2011-12-29 LAB — URINE MICROSCOPIC-ADD ON

## 2011-12-29 MED ORDER — NITROFURANTOIN MONOHYD MACRO 100 MG PO CAPS: 100.0000 mg | ORAL_CAPSULE | Freq: Two times a day (BID) | ORAL | Status: DC

## 2011-12-29 MED ORDER — OXYCODONE HCL 15 MG PO TABS: 15.0000 mg | ORAL_TABLET | Freq: Three times a day (TID) | ORAL | Status: DC | PRN

## 2011-12-29 MED ORDER — HYDROMORPHONE HCL PF 2 MG/ML IJ SOLN
2.0000 mg | Freq: Once | INTRAMUSCULAR | Status: AC
  Administered 2011-12-29: 2 mg via INTRAMUSCULAR
  Filled 2011-12-29: qty 1

## 2011-12-29 NOTE — ED Notes (Signed)
Pt states she would like a velcro wrist for L hand that has carpal tunnel.  Dr Estell Harpin notified.

## 2011-12-29 NOTE — ED Notes (Addendum)
PT. REPORTS CHRONIC MID/LOW BACK PAIN FOR SEVERAL YEARS , WORSE THIS EVENING UNRELIEVED BY ALEVE , PT. STATES SHE HAS A NERVE STIMULATOR AT HER BACK. DENIES INJURY OR FALL- AMBULATORY. PT. ADDED UTI SYMPTOMS - DYSURIA / CONCENTRATED URINE.

## 2011-12-29 NOTE — Progress Notes (Signed)
Orthopedic Tech Progress Note Patient Details:  Rebecca [name] [birth year] [id number]  Ortho Devices Type of Ortho Device: Velcro wrist splint   Haskell Flirt 12/29/2011, 4:37 AM

## 2011-12-29 NOTE — ED Provider Notes (Signed)
History     CSN: [id number]  Arrival date & time 12/29/11  2725   First MD Initiated Contact with Patient 12/29/11 [phone]      Chief Complaint  Patient presents with  . Back Pain    (Consider location/radiation/quality/duration/timing/severity/associated sxs/prior treatment) Patient is a 51 y.o. female presenting with back pain. The history is provided by the patient (pt has a hx of chronic back pain.  she is out of her pan med.). No language interpreter was used.  Back Pain  This is a new problem. The current episode started more than 2 days ago. The problem occurs constantly. The problem has not changed since onset.The pain is associated with no known injury. The pain is present in the lumbar spine. The quality of the pain is described as aching. The pain does not radiate. The pain is at a severity of 6/10. Associated symptoms include dysuria. Pertinent negatives include no chest pain, no headaches and no abdominal pain.    Past Medical History  Diagnosis Date  . Mood disorder   . GERD (gastroesophageal reflux disease)   . Chronic pain   . Back pain   . Diabetes mellitus   . Carpal tunnel syndrome   . ADHD (attention deficit hyperactivity disorder)   . Fibromyalgia   . Mitral valve prolapse     Past Surgical History  Procedure Date  . Mood disorder   . Spinal cord stimulator implant   . Abdominal hysterectomy   . Back surgery   . Carpal tunnel release   . Neuro stimulator     Pt had neuro stiumlator implated in back     No family history on file.  History  Substance Use Topics  . Smoking status: Current Every Day Smoker -- 1.0 packs/day    Types: Cigarettes  . Smokeless tobacco: Never Used  . Alcohol Use: No     Comment:      OB History    Grav Para Term Preterm Abortions TAB SAB Ect Mult Living   4 4 4       4       Review of Systems  Constitutional: Negative for fatigue.  HENT: Negative for congestion, sinus pressure and ear discharge.   Eyes: Negative  for discharge.  Respiratory: Negative for cough.   Cardiovascular: Negative for chest pain.  Gastrointestinal: Negative for abdominal pain and diarrhea.  Genitourinary: Positive for dysuria. Negative for frequency and hematuria.  Musculoskeletal: Positive for back pain.  Skin: Negative for rash.  Neurological: Negative for seizures and headaches.  Hematological: Negative.   Psychiatric/Behavioral: Negative for hallucinations.    Allergies  Ciprofloxacin hcl; Sulfa antibiotics; Sulfa antibiotics; Ativan; Latex; Ciprofloxacin; Keflex; Metoclopramide hcl; Restoril; and Risperidone  Home Medications   Current Outpatient Rx  Name  Route  Sig  Dispense  Refill  . DIAZEPAM 10 MG PO TABS   Oral   Take 10 mg by mouth every 8 (eight) hours. To prevent panic attacks         . DIPHENHYDRAMINE HCL 25 MG PO TABS   Oral   Take 25 mg by mouth every 6 (six) hours as needed. For congestion         . GABAPENTIN 300 MG PO CAPS   Oral   Take 600 mg by mouth 3 (three) times daily.          . IBUPROFEN 800 MG PO TABS   Oral   Take 800 mg by mouth every 6 (six) hours as needed.  pain         . NAPROXEN SODIUM 220 MG PO TABS   Oral   Take 440 mg by mouth 3 (three) times daily as needed. For pain         . OXYCODONE HCL 15 MG PO TABS   Oral   Take 15 mg by mouth every 4 (four) hours as needed.         Marland Kitchen ZOLPIDEM TARTRATE 10 MG PO TABS   Oral   Take 10 mg by mouth at bedtime. For sleep         . MEDROXYPROGESTERONE ACETATE 150 MG/ML IM SUSP   Intramuscular   Inject 1 mL (150 mg total) into the muscle every 3 (three) months.   1 mL   3   . NITROFURANTOIN MONOHYD MACRO 100 MG PO CAPS   Oral   Take 1 capsule (100 mg total) by mouth 2 (two) times daily.   14 capsule   0   . OXYCODONE HCL 15 MG PO TABS   Oral   Take 1 tablet (15 mg total) by mouth every 8 (eight) hours as needed for pain.   40 tablet   0     BP 114/42  Pulse 109  Temp 98.5 F (36.9 C) (Oral)  Resp  16  SpO2 99%  Physical Exam  Constitutional: She is oriented to person, place, and time. She appears well-developed.  HENT:  Head: Normocephalic and atraumatic.  Eyes: Conjunctivae normal and EOM are normal. No scleral icterus.  Neck: Neck supple. No thyromegaly present.  Cardiovascular: Normal rate and regular rhythm.  Exam reveals no gallop and no friction rub.   No murmur heard. Pulmonary/Chest: No stridor. She has no wheezes. She has no rales. She exhibits no tenderness.  Abdominal: She exhibits no distension. There is no tenderness. There is no rebound.  Musculoskeletal: She exhibits tenderness. She exhibits no edema.       Tender lumbar spine  Lymphadenopathy:    She has no cervical adenopathy.  Neurological: She is oriented to person, place, and time. Coordination normal.  Skin: No rash noted. No erythema.  Psychiatric: She has a normal mood and affect. Her behavior is normal.    ED Course  Procedures (including critical care time)  Labs Reviewed  URINALYSIS, ROUTINE W REFLEX MICROSCOPIC - Abnormal; Notable for the following:    Color, Urine AMBER (*)  BIOCHEMICALS MAY BE AFFECTED BY COLOR   APPearance TURBID (*)     Specific Gravity, Urine 1.039 (*)     Bilirubin Urine SMALL (*)     Nitrite POSITIVE (*)     Leukocytes, UA SMALL (*)     All other components within normal limits  URINE MICROSCOPIC-ADD ON - Abnormal; Notable for the following:    Squamous Epithelial / LPF FEW (*)     Bacteria, UA MANY (*)     Crystals CA OXALATE CRYSTALS (*)     All other components within normal limits  URINE CULTURE   No results found.   1. UTI (lower urinary tract infection)       MDM          Benny Lennert, MD 12/29/11 [phone]
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
## 2011-12-29 NOTE — ED Notes (Signed)
Ortho paged and responded. 
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
## 2011-12-31 LAB — URINE CULTURE: Colony Count: >=100000

## 2012-01-01 NOTE — ED Notes (Signed)
+   urine Patient treated with Macrobid-sensitive to same-chart appended per protocol MD. 

## 2012-01-02 ENCOUNTER — Encounter: Payer: Self-pay | Admitting: Gynecology

## 2012-01-23 ENCOUNTER — Emergency Department (HOSPITAL_COMMUNITY)
Admission: EM | Admit: 2012-01-23 | Discharge: 2012-01-23 | Discharge status: Against Medical Advice | Payer: Medicare Other | Attending: Emergency Medicine | Admitting: Emergency Medicine

## 2012-01-23 ENCOUNTER — Encounter (HOSPITAL_COMMUNITY): Payer: Self-pay | Admitting: *Deleted

## 2012-01-23 DIAGNOSIS — R0602 Shortness of breath: Secondary | ICD-10-CM | POA: Insufficient documentation

## 2012-01-23 DIAGNOSIS — M79609 Pain in unspecified limb: Secondary | ICD-10-CM | POA: Insufficient documentation

## 2012-01-23 DIAGNOSIS — M542 Cervicalgia: Secondary | ICD-10-CM | POA: Insufficient documentation

## 2012-01-23 DIAGNOSIS — M549 Dorsalgia, unspecified: Secondary | ICD-10-CM | POA: Insufficient documentation

## 2012-01-23 NOTE — ED Provider Notes (Signed)
I did not evaluate the patient at this time.  The patient left prior to my evaluation.  I did not perform a history or physical  Lyanne Co, MD 01/23/12 2326

## 2012-01-23 NOTE — ED Notes (Signed)
Pt at desk stating her ride was leaving and she was going to have to leave. Explained to patient that MD had signed up to see patient, pt left room. Ambulatory to door.

## 2012-01-23 NOTE — ED Notes (Signed)
Per EMS: pt c/o neck, back, leg pain x several hours.  States took 2 oxycodone earlier.  Pt also c/o SOB.

## 2013-03-14 ENCOUNTER — Other Ambulatory Visit: Payer: Self-pay | Admitting: Gynecology

## 2013-03-16 ENCOUNTER — Other Ambulatory Visit: Payer: Self-pay | Admitting: Gynecology

## 2013-03-30 ENCOUNTER — Other Ambulatory Visit: Payer: Self-pay | Admitting: Gynecology

## 2013-11-21 ENCOUNTER — Encounter (HOSPITAL_COMMUNITY): Payer: Self-pay | Admitting: *Deleted

## 2014-04-22 IMAGING — CR DG CHEST 2V
2 series · 2 of 2 positions shown · non-contrast
Comparison: 12/15/2010

CLINICAL DATA: Bilateral leg swelling.  Chest congestion.

CHEST - 2 VIEW

[w chest pa]
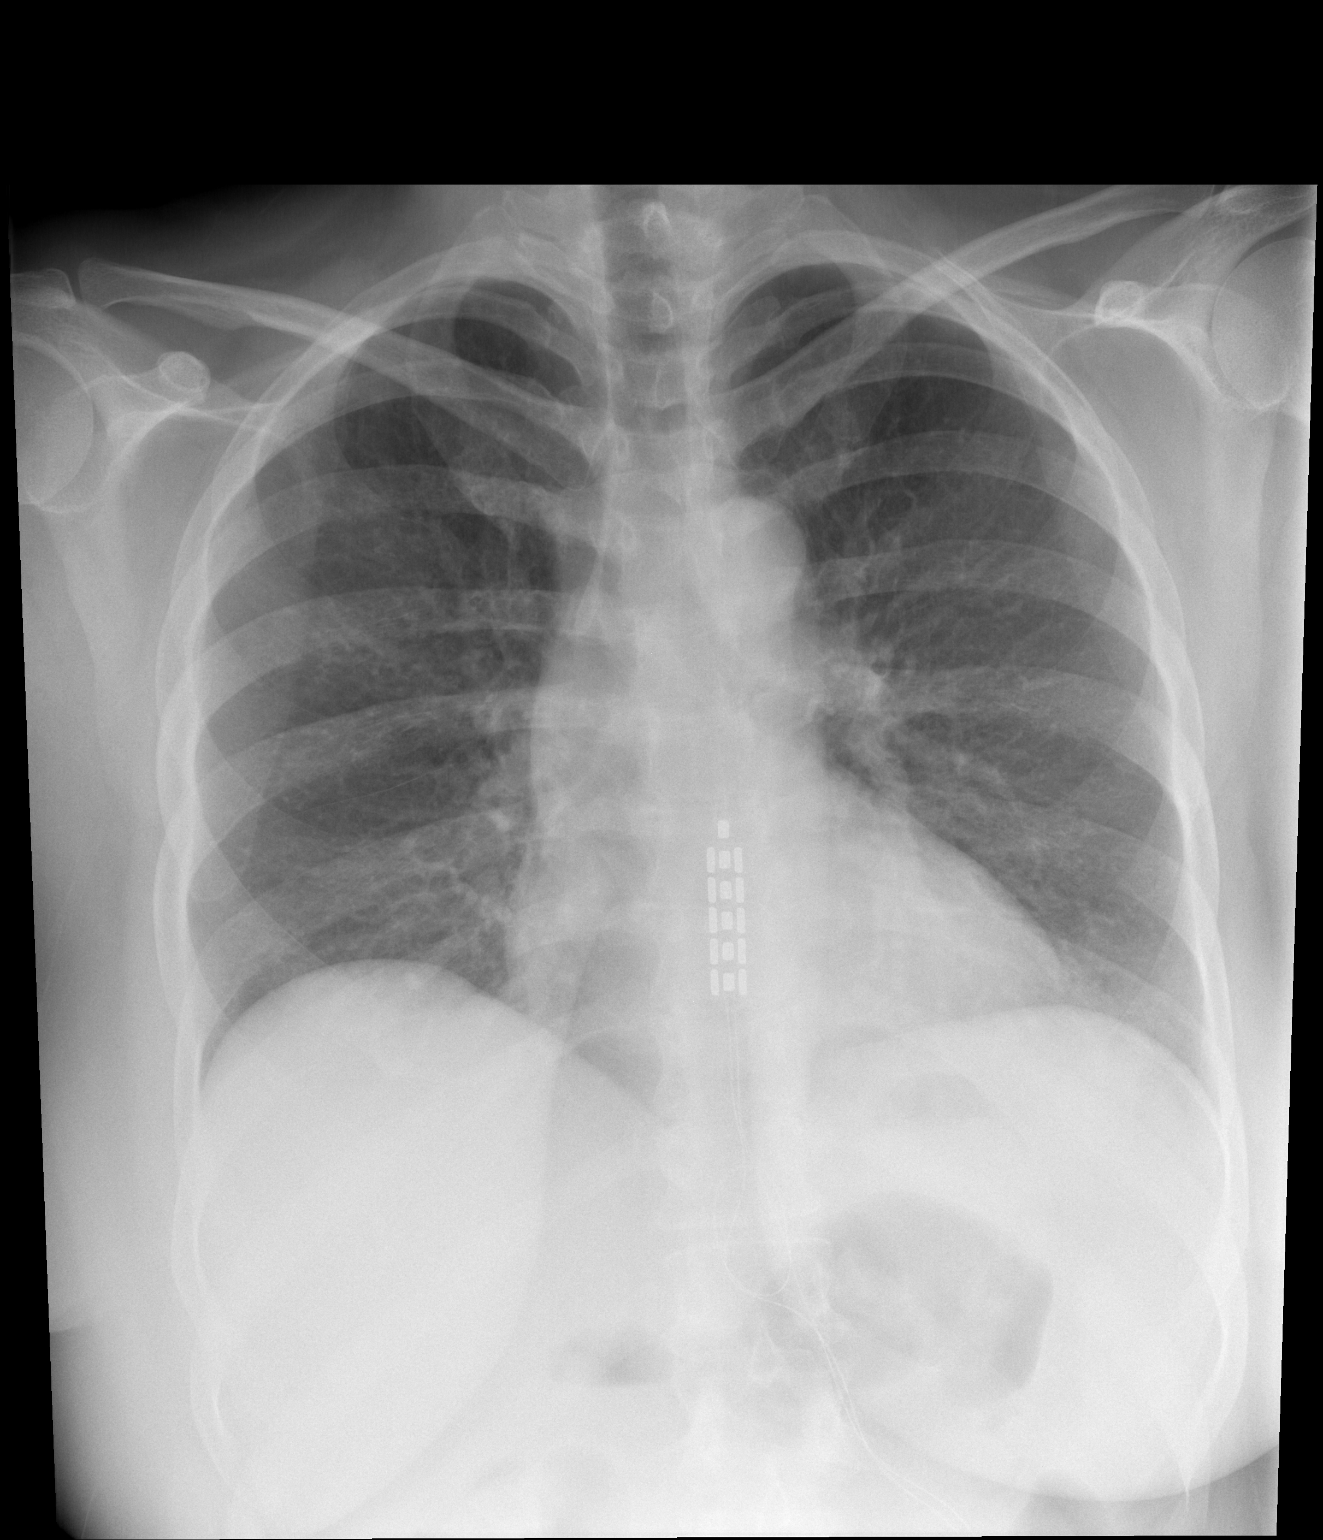

[w chest lat]
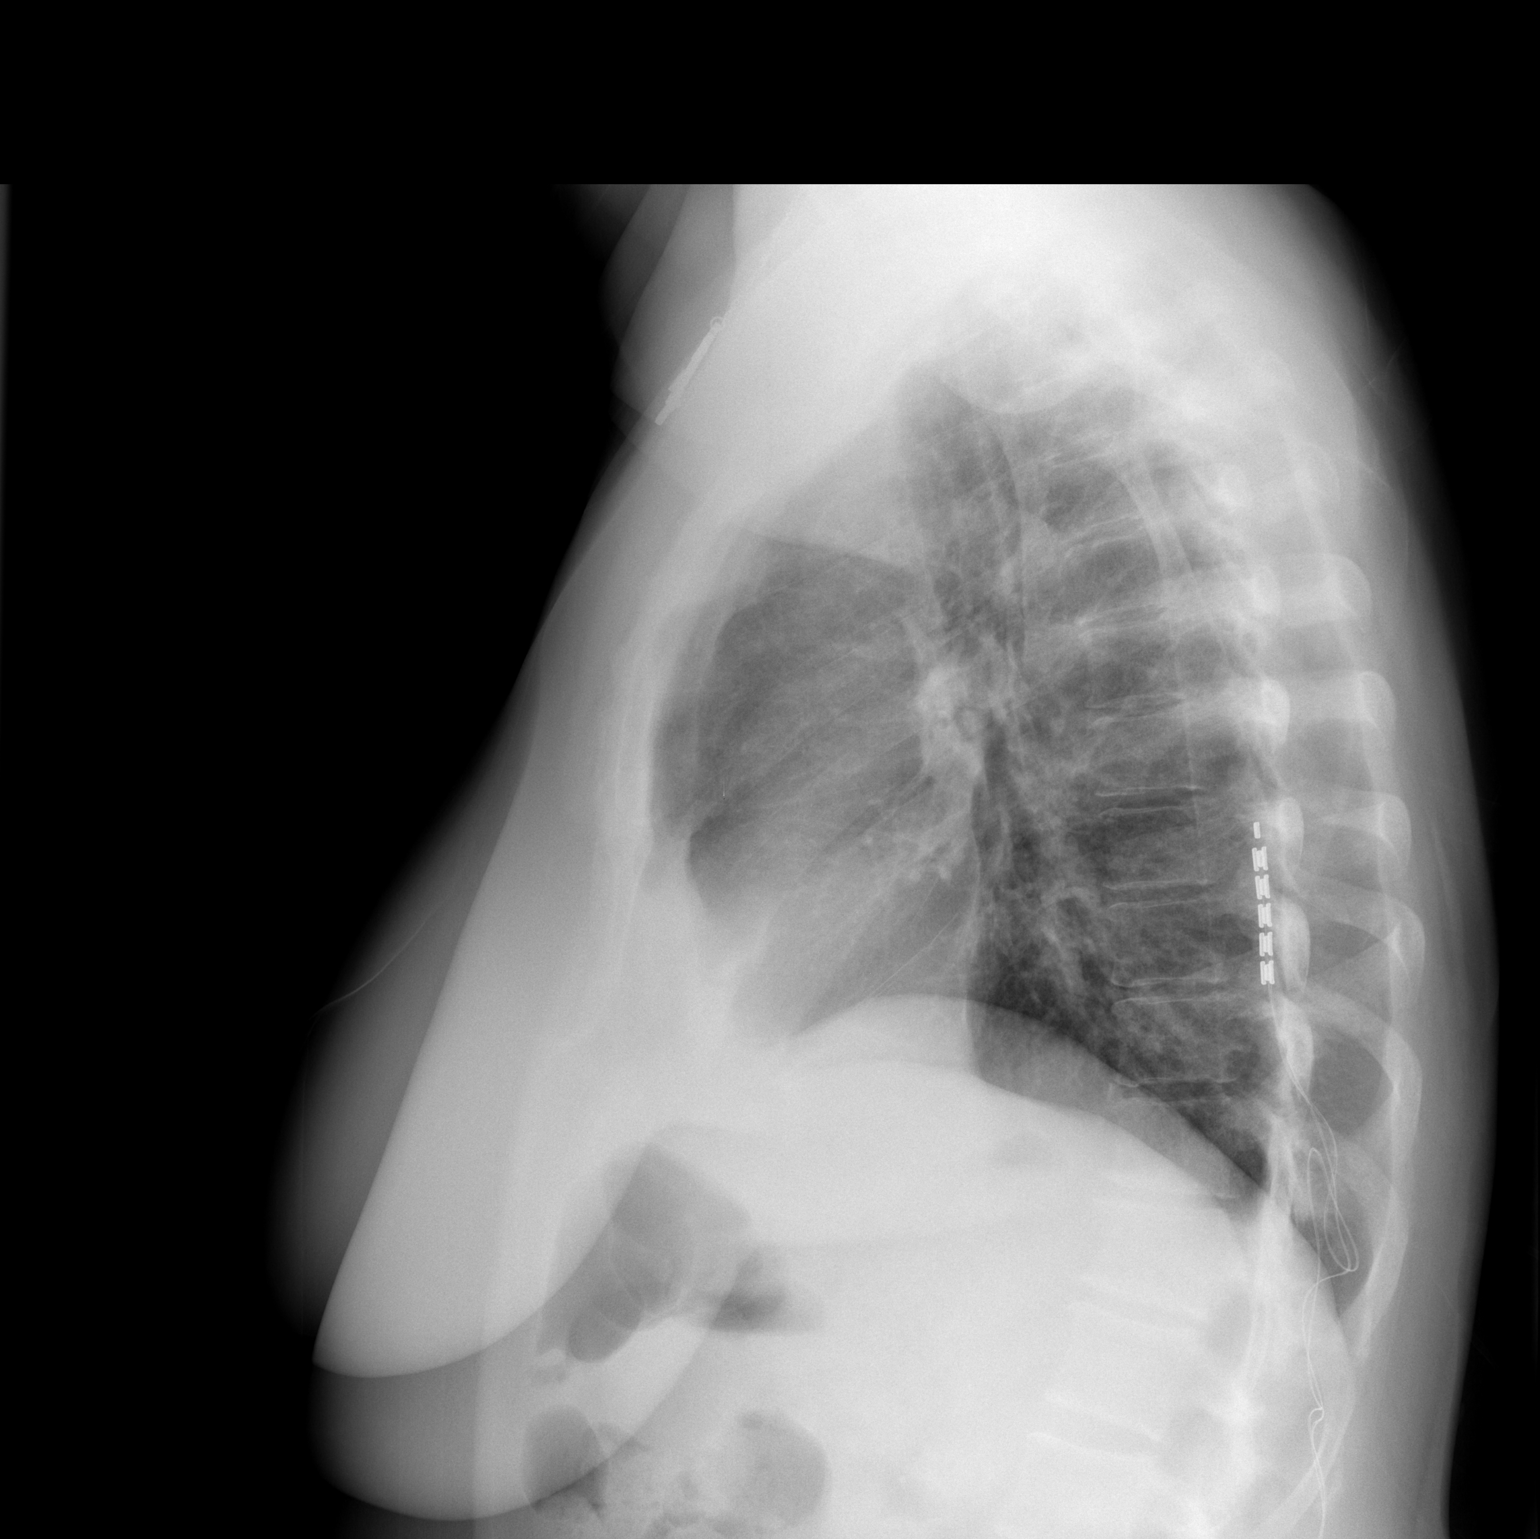

[2 of 2 positions shown; findings below may reference images not displayed]

FINDINGS: The heart size and pulmonary vascularity are normal and
the lungs are clear.  No effusions.  No acute osseous abnormality.
Neurostimulator is seen in the lower thoracic spine.
IMPRESSION: No acute abnormalities.

## 2014-06-23 ENCOUNTER — Encounter: Payer: Self-pay | Admitting: Gastroenterology

## 2014-08-20 ENCOUNTER — Emergency Department: Admit: 2014-08-20 | Payer: MEDICARE | Primary: Family Medicine

## 2014-08-20 ENCOUNTER — Inpatient Hospital Stay
Admit: 2014-08-20 | Discharge: 2014-08-21 | Disposition: A | Discharge status: Home / Self Care | Payer: MEDICARE | Attending: Emergency Medicine

## 2014-08-20 DIAGNOSIS — J320 Chronic maxillary sinusitis: Secondary | ICD-10-CM

## 2014-08-20 LAB — METABOLIC PANEL, BASIC
BUN: 16 mg/dl (ref 7–25)
CO2: 21 mEq/L (ref 21–32)
Calcium: 9.2 mg/dl (ref 8.5–10.1)
Chloride: 107 mEq/L (ref 98–107)
Creatinine: 0.7 mg/dl (ref 0.6–1.3)
GFR est AA: >60
GFR est non-AA: >60
Glucose: 96 mg/dl (ref 74–106)
Potassium: 2.9 mEq/L — CL (ref 3.5–5.1)
Sodium: 142 mEq/L (ref 136–145)

## 2014-08-20 LAB — CBC WITH AUTOMATED DIFF
BASOPHILS: 0.3 % (ref 0–3)
EOSINOPHILS: 1.3 % (ref 0–5)
HCT: 41.2 % (ref 37.0–50.0)
HGB: 14.2 gm/dl (ref 13.0–17.2)
IMMATURE GRANULOCYTES: 0.1 % (ref 0.0–3.0)
LYMPHOCYTES: 29.3 % (ref 28–48)
MCH: 30.5 pg (ref 25.4–34.6)
MCHC: 34.5 gm/dl (ref 30.0–36.0)
MCV: 88.4 fL (ref 80.0–98.0)
MONOCYTES: 9.4 % (ref 1–13)
MPV: 10.6 fL — ABNORMAL HIGH (ref 6.0–10.0)
NEUTROPHILS: 59.6 % (ref 34–64)
NRBC: 0 (ref 0–0)
PLATELET: 255 10*3/uL (ref 140–450)
RBC: 4.66 M/uL (ref 3.60–5.20)
RDW-SD: 43.7 (ref 36.4–46.3)
WBC: 7.9 10*3/uL (ref 4.0–11.0)

## 2014-08-20 LAB — POC TROPONIN: Troponin-I: 0 ng/ml (ref 0.00–0.07)

## 2014-08-20 MED ORDER — SODIUM CHLORIDE 0.9 % INJECTION
5 mg/mL | INTRAMUSCULAR | Status: AC
Start: 2014-08-20 — End: 2014-08-20
  Administered 2014-08-20: 22:00:00 via INTRAVENOUS

## 2014-08-20 MED ORDER — SODIUM CHLORIDE 0.9% BOLUS IV
0.9 % | INTRAVENOUS | Status: AC
Start: 2014-08-20 — End: 2014-08-20
  Administered 2014-08-20: 22:00:00 via INTRAVENOUS

## 2014-08-20 MED ORDER — DIPHENHYDRAMINE HCL 50 MG/ML IJ SOLN
50 mg/mL | INTRAMUSCULAR | Status: AC
Start: 2014-08-20 — End: 2014-08-20
  Administered 2014-08-20: 22:00:00 via INTRAVENOUS

## 2014-08-20 MED ORDER — POTASSIUM CHLORIDE SR 10 MEQ TAB
10 mEq | ORAL | Status: AC
Start: 2014-08-20 — End: 2014-08-20
  Administered 2014-08-20: via ORAL

## 2014-08-20 MED ORDER — SODIUM CHLORIDE 0.9 % IJ SYRG
Freq: Once | INTRAMUSCULAR | Status: AC
Start: 2014-08-20 — End: 2014-08-20
  Administered 2014-08-20: via INTRAVENOUS

## 2014-08-20 MED FILL — PROCHLORPERAZINE EDISYLATE 5 MG/ML INJECTION: 5 mg/mL | INTRAMUSCULAR | Qty: 2

## 2014-08-20 MED FILL — DIPHENHYDRAMINE HCL 50 MG/ML IJ SOLN: 50 mg/mL | INTRAMUSCULAR | Qty: 1

## 2014-08-20 MED FILL — POTASSIUM CHLORIDE SR 10 MEQ TAB: 10 mEq | ORAL | Qty: 4

## 2014-08-20 NOTE — ED Provider Notes (Signed)
[facility]  EMER[facility]REATMENT REPORT  NAME:  Jennifer PlaterBRICKHOUSETYLER, Jennifer  SEX:   F  ADMIT: 08/20/2014  DOB:   1960-11-18  MR#    [id number]  ROOM:  ER09  TIME DICTATED: 05 20 PM  ACCT#  [id number]    cc: [name]    TIME OF EVALUATION:  1650    PRIMARY CARE PROVIDER:  Dr. [name].    CHIEF COMPLAINT:  Myalgia.    HISTORY OF PRESENT ILLNESS:  This is a 54 year old female who has numerous medical conditions including a   previous CVA, seizure disorder, migraine headaches, presenting to the   Emergency Department today secondary to what she describes as a syncopal   event.  The patient is a poor historian.  She says that 3 days ago she was   walking around in her yard, felt dizzy and fell.  She states that family   members did describe some type of seizure-like activity when she fell.  They   thought that she was \\"faking it\\" and did not take her anywhere to be   evaluated for this.  The patient, since that time, has had persistent   right-sided headache, throbbing, associated with photo and phonophobia, nausea   but no vomiting.  She reports numerous body aches after her fall and due to   this is here for further evaluation.  She otherwise denies any chest pain or   shortness of breath.    REVIEW OF SYSTEMS:  CONSTITUTIONAL:  Denies fever.  EYES:  No visual symptoms, but does report photophobia.  ENT:  No sore throat, runny nose, or other URI symptoms.   RESPIRATORY:  No shortness of breath.  CARDIOVASCULAR:  No chest pain.  GASTROINTESTINAL:  Denies abdominal pain, no vomiting or diarrhea.  GENITOURINARY:  No urinary complaints.  MUSCULOSKELETAL:  Reports numerous body aches.  INTEGUMENTARY:  No rashes.  NEUROLOGIC:  Positive for headache.    PAST MEDICAL HISTORY:  History of previous CVA, seizure disorder, fibromyalgia, migraine headache.    The patient does have a neurostimulator in place.  She is concerned that it is   not working.  History of hysterectomy.    SOCIAL HISTORY:   Positive for tobacco use.    FAMILY HISTORY:  Reports parents had heart disease.    MEDICATIONS:  Reviewed.  The patient does take a baby aspirin daily.    ALLERGIES:  CIPRO.    PHYSICAL EXAMINATION:  VITAL SIGNS:  Temperature 98.4, pulse 75, respirations 18, blood pressure   104/53, O2 sats 95% on room air.  GENERAL:  The patient well nourished, well developed, answering questions   appropriately, awake, alert, oriented but a poor historian.    HEENT:  Eyes:  Conjunctivae clear, lids normal.  Pupils equal, symmetrical,   and normally reactive.   ENT:  Mouth:  Mucous membranes are somewhat dry.  RESPIRATORY:  Clear and equal breath sounds.  No respiratory distress,   tachypnea, or accessory muscle use.    CARDIOVASCULAR:   Heart regular, without murmurs, gallops, rubs, or thrills.       GASTROINTESTINAL:   Normoactive bowel sounds.  Abdomen is soft and nontender.  MUSCULOSKELETAL:  The patient has no localized midline tenderness to her back,   including, neck and is moving all extremities spontaneously.  She does have   some bruising noted to her right upper arm and medial aspect of her left knee,   some bruising to her hands, but there is no  tenderness to palpation over   these areas of bruising.  Full range of motion.  SKIN:  As above, positive for bruising.  No rashes visualized.  NEUROLOGIC:  Sensation intact.  Motor strength equal and symmetric.  The   patient following commands.  Normal finger-to-nose task.    CONTINUATION BY [name], PA:    INITIAL ASSESSMENT AND MANAGEMENT PLAN:   This is a 54 year old female with numerous medical problems.  Per her report,   had a syncopal event 3 days ago.  She is complaining of numerous body pains,   including headache.  She is on baby aspirin.  We will obtain CT her head.  She   otherwise has no neck pain reproducible on evaluation.  We will obtain labs   for reports of syncope.    DIAGNOSTIC INTERPRETATION:   CT head as read by radiology, revealed right maxillary sinusitis, otherwise   unremarkable.  Chest x-ray as read by radiology, read as normal.  EKG:  Dr.   Marijo Sanes did not see any acute S-T segment or T-wave abnormalities that   are consistent with acute ischemia or infarction.  CBC:  Grossly unremarkable.    Normal white count, normal hemoglobin and hematocrit.  BMP revealed   potassium of 2.9, was otherwise unremarkable.  Urine was negative for   infection.    EMERGENCY DEPARTMENT COURSE:   The patient was stable while here in the Emergency Department, medicated for   headache with IV Benadryl, IV Compazine, and a liter of IV fluids.  After the   medications given, the patient's headache had resolved and she was resting   comfortably.  Based on low potassium, she was given 40 mEq of potassium while   here in the Emergency Department.  Based on the patient having a right-sided   headache with right-sided maxillary sinusitis seen on CT, we will go ahead and   treat this. The patient will be sent home with prescription for Augmentin.    She is advised to follow up with her primary doctor for any kind of further   medication refill, as she apparently is out of her prescribed Lyrica.  In the   interim, take Tylenol and Motrin for pain.  A prescription for K-Dur potassium   replacement was also written.  The patient is to follow up with her doctor to   have the potassium rechecked as an outpatient.  The patient does voice   understanding to the above plan.      CLINICAL IMPRESSION AND DIAGNOSES:  1.  Cephalgia.  2.  Maxillary sinusitis.  3.  Hypokalemia.     DISPOSITION:  The patient discharged home.  Prescriptions and instructions as above.    The patient was personally evaluated by myself and Dr. Marijo Sanes who   agrees with the above assessment and plan.     ADDENDUM BY [name], MD:      I discussed the patient with physician assistant, [name], I also    examined the patient independently. The patient has migraines, fibromyalgia.    She got dizzy and fell.  We suspect this might have been syncope because she   has no recollection of hitting the floor.  She complains of a right-sided   headache, had some body aches diffusely. She is feeling much better after   Benadryl, Compazine and IV fluids.  She was given 40 mEq of potassium in the   Emergency Department.  She was eating a  boxed lunch when I evaluated her.  Her   CT scan showed maxillary sinusitis which is the side of the head that she   hurts on and as such we are going to prescribe her Augmentin.  We will keep   her on some potassium and have her follow up with her primary care provider   for potassium recheck.  I agree with documentation provided by [name].      ___________________  [name] MD  Dictated By: [name], PA    My signature above authenticates this document and my orders, the final   diagnosis (es), discharge prescription (s), and instructions in the Epic   record.  If you have any questions please contact [phone].    Nursing notes have been reviewed by the physician/ advanced practice   clinician.    Berstein Hilliker [facility]  D:08/20/2014 17:20:54  T: 08/21/2014 00:23:31  [id number]

## 2014-08-20 NOTE — ED Notes (Signed)
Had fall three days ago, son states it was "seizure like", she blacked out. Patient believes the nuerostimulator that she had placed on spine was messed up in fall. She is also out of lyrica and complains of pain all over.

## 2014-08-20 NOTE — ED Notes (Signed)
9:53 PM  08/20/2014     Discharge instructions given to patitent (name) with verbalization of understanding. Patient accompanied by self.  Patient discharged with the following prescriptions amoxicillin. Patient discharged to home (destination).      MELLOW Howard Pouch, RN

## 2014-08-21 LAB — POC URINE MACROSCOPIC
Glucose: NEGATIVE mg/dl
Ketone: 40 mg/dl — AB
Leukocyte Esterase: NEGATIVE
Nitrites: NEGATIVE
Protein: 30 mg/dl — AB
Specific gravity: 1.025 (ref 1.005–1.030)
Urobilinogen: 2 EU/dl — ABNORMAL HIGH (ref 0.0–1.0)
pH (UA): 6 (ref 5–9)

## 2014-08-21 LAB — EKG, 12 LEAD, INITIAL
Atrial Rate: 94 {beats}/min
Calculated P Axis: 81 degrees
Calculated R Axis: 1 degrees
Calculated T Axis: 17 degrees
Diagnosis: NORMAL
P-R Interval: 116 ms
Q-T Interval: 378 ms
QRS Duration: 96 ms
QTC Calculation (Bezet): 472 ms
Ventricular Rate: 94 {beats}/min

## 2014-08-21 MED ORDER — AMOXICILLIN CLAVULANATE 875 MG-125 MG TAB
875-125 mg | ORAL_TABLET | Freq: Two times a day (BID) | ORAL | Status: AC
Start: 2014-08-21 — End: 2014-08-30

## 2014-08-21 MED ORDER — POTASSIUM CHLORIDE SR 20 MEQ TAB, PARTICLES/CRYSTALS
20 mEq | ORAL_TABLET | Freq: Every day | ORAL | Status: AC
Start: 2014-08-21 — End: 2014-08-25

## 2017-04-12 ENCOUNTER — Emergency Department: Admit: 2017-04-12 | Payer: MEDICARE | Primary: Family Medicine

## 2017-04-12 ENCOUNTER — Inpatient Hospital Stay
Admit: 2017-04-12 | Discharge: 2017-04-12 | Disposition: A | Discharge status: Home / Self Care | Payer: MEDICARE | Attending: Emergency Medicine

## 2017-04-12 DIAGNOSIS — R079 Chest pain, unspecified: Secondary | ICD-10-CM

## 2017-04-12 LAB — ECHO TTE STRESS COMPLETE W CONT
ECG Interp During Ex: NORMAL
ECG Interp. During Exercise: NORMAL
Functional Capacity: NORMAL
Functional capacity: NORMAL
Max Diastolic BP: 86 mmHg
Max Heart Rate: 151 {beats}/min
Max Systolic BP: 140 mmHg
Max. Diastolic BP: 86 mmHg
Max. Heart rate: 151 {beats}/min
Max. Systolic BP: 140 mmHg
Overall BP Response To Exercise: NORMAL
Overall BP response to exercise: NORMAL
Peak EX METS: 7 METS
Peak Ex METs: 7 METS

## 2017-04-12 LAB — EKG 12-LEAD
Atrial Rate: 95 {beats}/min
Diagnosis: NORMAL
P Axis: 74 degrees
P-R Interval: 138 ms
Q-T Interval: 358 ms
QRS Duration: 92 ms
QTc Calculation (Bazett): 449 ms
R Axis: 5 degrees
T Axis: 74 degrees
Ventricular Rate: 95 {beats}/min

## 2017-04-12 LAB — D-DIMER, QUANTITATIVE: D-Dimer, Quant: 0.48 ug/mL (FEU) (ref 0.01–0.50)

## 2017-04-12 LAB — CBC WITH AUTOMATED DIFF
BASOPHILS: 0.3 % (ref 0–3)
EOSINOPHILS: 1.7 % (ref 0–5)
HCT: 43.2 % (ref 37.0–50.0)
HGB: 15 gm/dl (ref 13.0–17.2)
IMMATURE GRANULOCYTES: 0.5 % (ref 0.0–3.0)
LYMPHOCYTES: 28.5 % (ref 28–48)
MCH: 30.4 pg (ref 25.4–34.6)
MCHC: 34.7 gm/dl (ref 30.0–36.0)
MCV: 87.4 fL (ref 80.0–98.0)
MONOCYTES: 9.1 % (ref 1–13)
MPV: 10.2 fL — ABNORMAL HIGH (ref 6.0–10.0)
NEUTROPHILS: 59.9 % (ref 34–64)
NRBC: 0 (ref 0–0)
PLATELET: 242 10*3/uL (ref 140–450)
RBC: 4.94 M/uL (ref 3.60–5.20)
RDW-SD: 41.7 (ref 36.4–46.3)
WBC: 10.5 10*3/uL (ref 4.0–11.0)

## 2017-04-12 LAB — EKG, 12 LEAD, INITIAL
Atrial Rate: 95 {beats}/min
Calculated P Axis: 74 degrees
Calculated R Axis: 5 degrees
Calculated T Axis: 74 degrees
Diagnosis: NORMAL
P-R Interval: 138 ms
Q-T Interval: 358 ms
QRS Duration: 92 ms
QTC Calculation (Bezet): 449 ms
Ventricular Rate: 95 {beats}/min

## 2017-04-12 LAB — METABOLIC PANEL, COMPREHENSIVE
ALT (SGPT): 17 U/L (ref 12–78)
AST (SGOT): 9 U/L — ABNORMAL LOW (ref 15–37)
Albumin: 3.6 gm/dl (ref 3.4–5.0)
Alk. phosphatase: 73 U/L (ref 45–117)
Anion gap: 8 mmol/L (ref 5–15)
BUN: 11 mg/dl (ref 7–25)
Bilirubin, total: 0.4 mg/dl (ref 0.2–1.0)
CO2: 22 mEq/L (ref 21–32)
Calcium: 8.8 mg/dl (ref 8.5–10.1)
Chloride: 110 mEq/L — ABNORMAL HIGH (ref 98–107)
Creatinine: 0.9 mg/dl (ref 0.6–1.3)
GFR est AA: >60
GFR est non-AA: >60
Glucose: 119 mg/dl — ABNORMAL HIGH (ref 74–106)
Potassium: 3.5 mEq/L (ref 3.5–5.1)
Protein, total: 7.3 gm/dl (ref 6.4–8.2)
Sodium: 140 mEq/L (ref 136–145)

## 2017-04-12 LAB — TROPONIN I
Troponin-I: <0.015 ng/ml (ref 0.000–0.045)
Troponin-I: <0.015 ng/ml (ref 0.000–0.045)
Troponin-I: <0.015 ng/ml (ref 0.000–0.045)

## 2017-04-12 LAB — D DIMER: D DIMER: 0.48 ug/mL (FEU) (ref 0.01–0.50)

## 2017-04-12 MED ORDER — MORPHINE 4 MG/ML SYRINGE
4 mg/mL | INTRAMUSCULAR | Status: AC
Start: 2017-04-12 — End: 2017-04-12

## 2017-04-12 MED ORDER — ONDANSETRON 4 MG TAB, RAPID DISSOLVE
4 mg | Freq: Four times a day (QID) | ORAL | Status: DC | PRN
Start: 2017-04-12 — End: 2017-04-12

## 2017-04-12 MED ORDER — SODIUM CHLORIDE 0.9 % IJ SYRG
Freq: Three times a day (TID) | INTRAMUSCULAR | Status: DC
Start: 2017-04-12 — End: 2017-04-12

## 2017-04-12 MED ORDER — ONDANSETRON (PF) 4 MG/2 ML INJECTION
4 mg/2 mL | Freq: Once | INTRAMUSCULAR | Status: AC
Start: 2017-04-12 — End: 2017-04-12
  Administered 2017-04-12: 08:00:00 via INTRAVENOUS

## 2017-04-12 MED ORDER — SODIUM CHLORIDE 0.9 % IJ SYRG
INTRAMUSCULAR | Status: DC | PRN
Start: 2017-04-12 — End: 2017-04-12

## 2017-04-12 MED ORDER — ACETAMINOPHEN 325 MG TABLET
325 mg | Freq: Four times a day (QID) | ORAL | Status: DC | PRN
Start: 2017-04-12 — End: 2017-04-12
  Administered 2017-04-12: 11:00:00 via ORAL

## 2017-04-12 MED ORDER — MORPHINE 4 MG/ML SYRINGE
4 mg/mL | INTRAMUSCULAR | Status: AC
Start: 2017-04-12 — End: 2017-04-12
  Administered 2017-04-12: 08:00:00 via INTRAVENOUS

## 2017-04-12 MED ORDER — IBUPROFEN 200 MG TAB
200 mg | Freq: Four times a day (QID) | ORAL | Status: DC | PRN
Start: 2017-04-12 — End: 2017-04-12
  Administered 2017-04-12: 11:00:00 via ORAL

## 2017-04-12 MED FILL — ACETAMINOPHEN 325 MG TABLET: 325 mg | ORAL | Qty: 3

## 2017-04-12 MED FILL — IBUPROFEN 200 MG TAB: 200 mg | ORAL | Qty: 1

## 2017-04-12 MED FILL — MORPHINE 4 MG/ML SYRINGE: 4 mg/mL | INTRAMUSCULAR | Qty: 1

## 2017-04-12 MED FILL — ONDANSETRON (PF) 4 MG/2 ML INJECTION: 4 mg/2 mL | INTRAMUSCULAR | Qty: 2

## 2017-04-12 NOTE — Discharge Summary (Signed)
Discharge Summary by Julien Girt, PA-C at 04/12/17 1417                Author: Julien Girt, PA-C  Service: Emergency Medicine  Author Type: Physician Assistant       Filed: 04/12/17 1419  Date of Service: 04/12/17 1417  Status: Attested           Editor: Meribeth Mattes (Physician Assistant)  Cosigner: Elveria Royals, MD at 04/12/17 1432          Attestation signed by Elveria Royals, MD at 04/12/17 1432          I discussed the patient with the mid-level provider and agree with the evaluation and treatment plan as documented here. I did not personally see the patient  but was available to see the patient at their request.                                    [facility]   Emergency ObservationDepartment    Chest Pain Discharge Summary          Patient: Jennifer Downs  Age: 57 y.o.  Sex: female          Date of Birth: Sep 21, 1960  Admit Date: 04/12/2017  PCP: None     MRN: [id number]   CSN: [id number]            Room: 114/EO14            Discharge Physician:    Dr. Randell Patient   Date/Time of Observation Admission:   April 12, 2017 4:15 AM   Date/Time of Observation Discharge:   April 12, 2017 2:17 PM        History of Present Illness     57 y.o. female  was seen in the ED for evaulation of chest pain.  The initial evaluation was unremarkable and subsequently the patient was assigned to ED Observation for further risk stratification to include repeat cardiac enzymes, and if negative, cardiac stress testing.         ED Observation Course     The patient remained stable.  They had intermittent CP.  No CP on treadmill.   Negative ESE.   Patient thinks this is stress related.   Plans to follow-up with PCP but needs a new one- given on call.      All Cardiac Markers in the last 24 hours:      Lab Results         Component  Value  Date/Time            TROPT  <0.015  04/12/2017 09:50 AM       TROPT  <0.015  04/12/2017 06:47 AM            TROPT  <0.015  04/12/2017 04:00  AM           They underwent exercise stress echocardiography.  Results of testing were negative for ischemia interpreted by Dr. Cleon Gustin.  The test was an adequate study with target heart rate achieved.        Echo Results  (Last 48 hours)                                    04/12/17 1125    ECHO TTE STRESS COMPLETE  W CONT  Final result            Narrative:    ==================== XCELERA REPORT ========================================                                                                                 Study ID: [id number]                                                       [facility]                                  [address] [place], [place] [address]                                                          Stress Echocardiogram Report             Name: CAMRIN, LAPRE Date: 03/24/2  019 11:21 AM      MRN: [id number]                     Patient Location: YN^[id number]      DOB: 08-Sep-1960                 Age: 84 yrs      Height: 62 in                   Weight: 250 lb                    BSA: 2.1 m2      BP: 115/76 mmHg                 HR: 87      Gender: Female                  Account #: [id number]      Reason For Study: Chest Pain      History: Fibromylagia,migraines,smoker,tia,hld,seizures      Ordering Physician: Lavone Nian      Performed By: Nancie Neas., RDCS             Interpretation Summary      The Electrocardiographic Interpretation: negative by ECG criteria.      The Echocardiographic Interpretation: hyperkinetic wall motion.      The OverAll Impression : negative for ischemia .             _____________________________________________________________________________      _             Interpretation Summary  Stress Results             Protocol:   Bruce             Maximum Predicted HR:     165 bpm             Target HR: 140 bpm        Percent Maximum Predicted HR:  92 %                                     Stage DurationHeart RateBP                                    (mm:ss)   (bpm)                            Stage 1  3:00 12     5   122/80                            Stage 2  3:00 14     1   140/86                            Stage 3  0:14 14     4       /                                    Stress Duration:  6:14 mm:ss *                     Maximum Stress HR:   151 bpm *M         ETS: 7                    Stress Comments      A treadmill exercise test according to Bruce protocol was performed. The      baseline ECG displays normal sinus rhythm. No arrhythmias were noted during      stress. During Stress: ST Changes are indeterminate. Test was terminated due      to target heart rate was achieved. Patient developed chest pain, pain was      typical. Normal blood pressure response.             I      WMSI = 1.00     % Normal = 100                                                                      REST  No LV segment      al                                                                      wall motion                                                                            abnormalities.                                                                                                        II      WMSI = 1.00     % Normal = 100                                                                      STRESS                                                                      No LV segment      al                                                                      wall motion                                                                            abnormalities.  Segments        Size      X - Cannot               2 -                       4 -            1-2          small      Interpret    1  - Normal  Hypokinetic  3 - Akinetic Dyskinetic     3-5   moder      ate      5 -                                                               6-14         large      Aneurysmal                                                       15-16  diffu      se                           Left Ventricle      The left ventricular chamber size at rest is normal. The left ventricular      chamber size at peak stress is smaller. The peak stress wall motion scores      are 'hyperkinetic'. Normal rest wall motion. Hyperkinetic wall motion with      stress.             MEASUREMENTS/CALCULATIONS:                                            Dr. Zonia Kief. Dian Situ., MD   04/12/2017 01:58      Electronically signed byPM                                    Physical Exam        Visit Vitals      BP  125/68 (BP 1 Location: Right arm, BP Patient Position: Supine)     Pulse  78     Temp  98.2 ??F (36.8 ??C)     Resp  20     Ht  5\' 2"  (1.575 m)     Wt  113.4 kg (250 lb)     SpO2  97%        BMI  45.73 kg/m??           Respiratory: Clear and equal breath sounds.  No respiratory distress.   Cardiovascular: Heart regular without murmer.      GI:  Abdomen soft non-tender without complaints of pain to palpation.        Clinical Impression/Diagnosis  ICD-10-CM  ICD-9-CM          1.  Chest pain, unspecified type  R07.9  786.50             Disposition and Plan     Patient is discharged home in stable condition. They were advised to follow-up with primary care in one week to have a recheck. Copies of all ED testing and stress tests results were  provided to the patient to take with them to their appointment.        It was discussed with the patient that their cardiac evaluation was unremarkable and does not indicate that immediate intervention is necessary. The patient was counseled that the testing is not 100% accurate and may generate false negatives.       Patient advised to return to the ED for any new or worsening symptoms.       The  patient was personally evaluated by myself and discussed with Dr. Thana AtesMcCormack who agrees with the above assessment and plan.      Julien GirtErin E Ashvin Adelson, PA-C   April 12, 2017      My signature above authenticates this document and my orders, the final ??   diagnosis (es), discharge prescription (s), and instructions in the Epic ??   record.   If you have any questions please contact [phone].   ??   Nursing notes have been reviewed by the physician/ advanced practice ??   Clinician.      Dragon medical dictation software was used for portions of this report. Unintended voice recognition errors may occur.

## 2017-04-12 NOTE — Other (Signed)
TRANSFER - OUT REPORT:    Verbal report given to Evan (name) on [employer] CorporationKathy L Downs  being transferred to EDObs (unit) for routine progression of care       Report consisted of patient???s Situation, Background, Assessment and   Recommendations(SBAR).     Information from the following report(s) SBAR was reviewed with the receiving nurse.    Lines:   Peripheral IV 04/12/17 Left Antecubital (Active)   Site Assessment Clean, dry, & intact 04/12/2017  3:46 AM   Phlebitis Assessment 0 04/12/2017  3:46 AM   Infiltration Assessment 0 04/12/2017  3:46 AM   Dressing Status Clean, dry, & intact 04/12/2017  3:46 AM   Dressing Type Transparent 04/12/2017  3:46 AM        Opportunity for questions and clarification was provided.      Patient transported with:   [employer]

## 2017-04-12 NOTE — ED Notes (Signed)
Discharged patient in stable condition, ambulatory accompanied by spouse. Instructions given and explained. Verbalized understanding.

## 2017-04-12 NOTE — ED Notes (Signed)
Pt taken to XR

## 2017-04-12 NOTE — ED Triage Notes (Signed)
Pt arrived via EMS with complaint of crushing chest pain with sudden onset, pt has history of MI but is unsure when it was, smokes about 1.5 ppd, reports she stopped taking all home meds, EMS gave 3 nitro and 2 mg morphine in field, pt A&O on arrival

## 2017-04-12 NOTE — ED Progress Note (Signed)
[facility]  Emergency Department Observation Unit       Observation Chest Pain Progress Note   Patient without issues overnight per nursing staff.  For stress testing today.  Cont CPP.      Visit Vitals  BP 94/52 (BP 1 Location: Right arm, BP Patient Position: Supine)   Pulse 70   Temp 97.7 ??F (36.5 ??C)   Resp 18   Ht 5\' 2"  (1.575 m)   Wt 113.4 kg (250 lb)   SpO2 99%   BMI 45.73 kg/m??       Diagnostic Results:   All Cardiac Markers in the last 24 hours:   Lab Results   Component Value Date/Time    TROPT <0.015 04/12/2017 06:47 AM    TROPT <0.015 04/12/2017 04:00 AM         Julien GirtErin E Makoa Satz, PA-C  April 12, 2017

## 2017-04-12 NOTE — Progress Notes (Signed)
Patient resting comfortably in bed. Provided warm blankets. No needs voiced at this time. Call bell within reach.

## 2017-04-12 NOTE — ED Notes (Signed)
Pt returned to room

## 2017-04-12 NOTE — ED Notes (Signed)
meds given, pt lying in bed on phone with call bell within reach, denies any needs at this time

## 2017-04-12 NOTE — ED Notes (Signed)
Bedside shift change report given to Pura Picinich RN (oncoming nurse) by Evan RN (offgoing nurse). Report included the following information SBAR.

## 2017-04-12 NOTE — ED Notes (Signed)
Back from stress test, V/S and light snacks given pending results.

## 2017-04-12 NOTE — ED Provider Notes (Signed)
[place]  Emergency Department Treatment Report    Patient: Jennifer Downs Age: 57 y.o. Sex: female    Date of Birth: 07/04/1960 Admit Date: 04/12/2017 PCP: None   MRN: [id number]  CSN: [id number]  Attending: Oneita Hurt, MD   Room: ER35/ER35 Time Dictated: 4:06 AM APP:  Harless Nakayama, PA-C     Chief Complaint   Chief Complaint   Patient presents with   ??? Chest Pain         History of Present Illness   57 y.o. female with history of hyperlipidemia, mitral valve prolapse, fibromyalgia and chronic back pain with neurostimulator who presents emergency department with pain under her left breast that feels like it radiates straight through to her back that began while arguing with her boyfriend.  She states that initially felt as if there is something sitting on her chest and felt short of breath and gradually worsened.  She did experience nausea but denies vomiting, she also had sweating bilateral hands and tingling in her hands.  She states the pain began around 2:30-3 AM.  She did take aspirin at home and was given 3 nitroglycerin in route by paramedics as well as 2 of morphine and states that she has had no relief.  She admits to alcohol use on the weekends and states that she smokes a pack a day of cigarettes.  She has a family history of blood clots with her mother and her sister but denies recent prolonged travel or immobility, surgeries, fractures, hormone replacement therapy, known cancer or personal history of blood clots.  She denies hemoptysis, syncope or leg swelling.  She denies family history of heart disease and personal history of diabetes and hypertension.  She denies history of similar symptoms in the past.  She states that her doctor at one point told her that she might have had a mild heart attack but is never had stents or cardiac catheterization.  She states that her last cardiac stress test was 2 years ago.  She denies known cardiologist.     Review of Systems   Constitutional: No fever, chills. No weakness.   Eyes: No change in vision.  ENT: No sore throat, congestion   Respiratory: +shortness of breath. No cough,  or wheezing.  Cardiovascular: + chest pain/pressure. no palpitations. No syncope.  Gastrointestinal: no abdominal pain. No nausea, vomiting, diarrhea.    Genitourinary: No painful urination, frequency, or urgency.  Musculoskeletal: No joint pain or leg swelling.  Integumentary: No rashes.  Neurological: No headache or dizziness     Past Medical/Surgical History     Past Medical History:   Diagnosis Date   ??? Back pain    ??? CAD (coronary artery disease)    ??? Fibromyalgia    ??? Migraines    ??? Mitral valve disorder    ??? Seizures ([place])    ??? Stroke [facility])      Past Surgical History:   Procedure Laterality Date   ??? HX HYSTERECTOMY     ??? HX ORTHOPAEDIC      carpal tunnel   ??? NEUROLOGICAL PROCEDURE UNLISTED      placement of neurostimulator     Social History     Social History     Socioeconomic History   ??? Marital status: LEGALLY SEPARATED     Spouse name: Not on file   ??? Number of children: Not on file   ??? Years of education: Not on file   ??? Highest education  level: Not on file   Tobacco Use   ??? Smoking status: Current Every Day Smoker     Packs/day: 1.00   ??? Smokeless tobacco: Never Used   Substance and Sexual Activity   ??? Alcohol use: Yes     Comment: occasional   ??? Drug use: Yes     Types: Marijuana     Family History   History reviewed. No pertinent family history.    Home Medications     None       Allergies   Allergen Reactions   ??? Ciprofloxacin Other (comments)     Muscle cramping         Physical Exam     ED Triage Vitals [04/12/17 0343]   Enc Vitals Group      BP 109/69      Pulse       Resp Rate 15      Temp 98.6 ??F (37 ??C)      Temp src       O2 Sat (%) 96 %      Weight 250 lb      Height _0       Head Circumference       Peak Flow       Pain Score       Pain Loc       Pain Edu?       Excl. in [place]?       General: Patient appears well developed and well nourished.   HEENT: Conjunctiva clear. PERRL. Mucous membranes moist, non-erythematous.   Neck: Supple, symmetrical. No masses noted.   Respiratory: Lungs clear to auscultation, nonlabored respirations. No wheezes, rhonchi, or rales.  Cardiovascular: Heart regular rate and rhythm without murmur, rubs or gallops.   Gastrointestinal: Normoactive bowel sounds.  Abdomen soft nondistended.  No abdominal tenderness palpation.  No rebound or guarding.  Musculoskeletal: No peripheral edema.  Integumentary: Warm and dry without rashes or lesions.  Neurologic: Alert and oriented, moving all extremities.     Impression and Management Plan   Patient with chest pain will obtain EKG, chest x-ray, d-dimer, cardiac enzymes, labs and reassess.  We will treat her pain with morphine and Zofran.  Will monitor on the cardiac monitor.  Her vital signs are stable at this time.  Diagnostic Studies   Lab:   Recent Results (from the past 12 hour(s))   CBC WITH AUTOMATED DIFF    Collection Time: 04/12/17  4:00 AM   Result Value Ref Range    WBC 10.5 4.0 - 11.0 1000/mm3    RBC 4.94 3.60 - 5.20 M/uL    HGB 15.0 13.0 - 17.2 gm/dl    HCT 43.2 37.0 - 50.0 %    MCV 87.4 80.0 - 98.0 fL    MCH 30.4 25.4 - 34.6 pg    MCHC 34.7 30.0 - 36.0 gm/dl    PLATELET 242 140 - 450 1000/mm3    MPV 10.2 (H) 6.0 - 10.0 fL    RDW-SD 41.7 36.4 - 46.3      NRBC 0 0 - 0      IMMATURE GRANULOCYTES 0.5 0.0 - 3.0 %    NEUTROPHILS 59.9 34 - 64 %    LYMPHOCYTES 28.5 28 - 48 %    MONOCYTES 9.1 1 - 13 %    EOSINOPHILS 1.7 0 - 5 %    BASOPHILS 0.3 0 - 3 %   D DIMER    Collection Time: 04/12/17  4:00 AM   Result Value Ref Range    D DIMER 0.48 0.01 - 0.50 ug/mL (FEU)   METABOLIC PANEL, COMPREHENSIVE    Collection Time: 04/12/17  4:00 AM   Result Value Ref Range    Sodium 140 136 - 145 mEq/L    Potassium 3.5 3.5 - 5.1 mEq/L    Chloride 110 (H) 98 - 107 mEq/L    CO2 22 21 - 32 mEq/L    Glucose 119 (H) 74 - 106 mg/dl     BUN 11 7 - 25 mg/dl    Creatinine 0.9 0.6 - 1.3 mg/dl    GFR est AA >60.0      GFR est non-AA >60      Calcium 8.8 8.5 - 10.1 mg/dl    AST (SGOT) 9 (L) 15 - 37 U/L    ALT (SGPT) 17 12 - 78 U/L    Alk. phosphatase 73 45 - 117 U/L    Bilirubin, total 0.4 0.2 - 1.0 mg/dl    Protein, total 7.3 6.4 - 8.2 gm/dl    Albumin 3.6 3.4 - 5.0 gm/dl    Anion gap 8 5 - 15 mmol/L   TROPONIN I    Collection Time: 04/12/17  4:00 AM   Result Value Ref Range    Troponin-I <0.015 0.000 - 0.045 ng/ml     EKG: Normal sinus rhythm with sinus arrhythmia, 95 bpm, PR interval 138 ms, QTC 449 ms, without acute ischemic changes.    Imaging:    No results found.   Chest x-ray without acute findings read by Dr. Hermelinda Medicus.  ED Course/ Medical Decision Making   Patient's d-dimer is negative with less likely to be thromboembolism.  Cardiac troponin is negative.  Chest xray and EKG without acute findings.  Labs are largely unremarkable.  We have discussed with patient and offered her ED observation for cardiac stress testing.  She is agreeable with this plan.  Feel that this is appropriate given her risk factors and history.  We will place orders for ED observation and recommend cardiac stress testing in the morning.  Medications   morphine injection 4 mg (4 mg IntraVENous Given 04/12/17 0411)   ondansetron (ZOFRAN) injection 4 mg (4 mg IntraVENous Given 04/12/17 0410)       There are no discharge medications for this patient.      Final Diagnosis       ICD-10-CM ICD-9-CM   1. Chest pain, unspecified type R07.9 786.50       Disposition   ED observation    The patient was personally evaluated by myself and [facility], Orlando Penner, MD who agrees with the above assessment and plan    Harless Nakayama, PA-C  April 12, 2017    My signature above authenticates this document and my orders, the final ??  diagnosis (es), discharge prescription (s), and instructions in the Epic ??  record.  If you have any questions please contact [phone].  ??   Nursing notes have been reviewed by the physician/ advanced practice ??  Clinician.

## 2017-04-12 NOTE — Discharge Summary (Signed)
[facility]  Emergency ObservationDepartment   Chest Pain Discharge Summary    Patient: Jennifer Downs Age: 57 y.o. Sex: female    Date of Birth: 04/10/1960 Admit Date: 04/12/2017 PCP: None   MRN: [id number]  CSN: [id number]     Room: 114/EO14       Discharge Physician:   Dr. Randell Patient  Date/Time of Observation Admission:  April 12, 2017 4:15 AM  Date/Time of Observation Discharge:  April 12, 2017 2:17 PM    History of Present Illness   58 y.o. female was seen in the ED for evaulation of chest pain.  The initial evaluation was unremarkable and subsequently the patient was assigned to ED Observation for further risk stratification to include repeat cardiac enzymes, and if negative, cardiac stress testing.     ED Observation Course   The patient remained stable.  They had intermittent CP.  No CP on treadmill.  Negative ESE.  Patient thinks this is stress related.  Plans to follow-up with PCP but needs a new one- given on call.    All Cardiac Markers in the last 24 hours:   Lab Results   Component Value Date/Time    TROPT <0.015 04/12/2017 09:50 AM    TROPT <0.015 04/12/2017 06:47 AM    TROPT <0.015 04/12/2017 04:00 AM       They underwent exercise stress echocardiography.  Results of testing were negative for ischemia interpreted by Dr. Cleon Gustin.  The test was an adequate study with target heart rate achieved.    Echo Results  (Last 48 hours)               04/12/17 1125  ECHO TTE STRESS COMPLETE W CONT Final result    Narrative:  ==================== XCELERA REPORT ========================================                                                                        Study ID: [id number]                                           [facility]                               [address] [place], [place] [address]                                           Stress Echocardiogram Report        Name: KAITLAND, LEWELLYN Date: 03/24/2  019 11:21 AM   MRN: [id number]                     Patient Location: [id number]   DOB: Mar 23, 1960  Age: 5 yrs   Height: 62 in                   Weight: 250 lb                    BSA: 2.1 m2   BP: 115/76 mmHg                 HR: 87   Gender: Female                  Account #: [id number]   Reason For Study: Chest Pain   History: Fibromylagia,migraines,smoker,tia,hld,seizures   Ordering Physician: Lavone NianSOSSONG, BRITTANY   Performed By: Nancie Neashitwood, Kristen A., RDCS       Interpretation Summary   The Electrocardiographic Interpretation: negative by ECG criteria.   The Echocardiographic Interpretation: hyperkinetic wall motion.   The OverAll Impression : negative for ischemia .       _____________________________________________________________________________   _       Interpretation Summary       Stress Results          Protocol:   Bruce             Maximum Predicted HR:     165 bpm          Target HR: 140 bpm        Percent Maximum Predicted HR:  92 %                              Stage DurationHeart RateBP                                 (mm:ss)   (bpm)                         Stage 1  3:00 12     5   122/80                         Stage 2  3:00 14     1   140/86                         Stage 3  0:14 14     4       /                           Stress Duration:  6:14 mm:ss *                  Maximum Stress HR:   151 bpm *M         ETS: 7           Stress Comments   A treadmill exercise test according to Bruce protocol was performed. The   baseline ECG displays normal sinus rhythm. No arrhythmias were noted during   stress. During Stress: ST Changes are indeterminate. Test was terminated due   to target heart rate was achieved. Patient developed chest pain, pain was   typical. Normal blood pressure response.       I      WMSI = 1.00     % Normal = 100  REST                                                                    No LV segment   al                                                                   wall motion                                                                      abnormalities.                                                           II      WMSI = 1.00     % Normal = 100                                                                   STRESS                                                                   No LV segment   al                                                                   wall motion                                                                      abnormalities.  Segments     Size   X - Cannot               2 -                       4 -            1-2       small   Interpret    1 - Normal  Hypokinetic  3 - Akinetic Dyskinetic     3-5   moder   ate   5 -                                                               6-14      large   Aneurysmal                                                       15-16  diffu   se               Left Ventricle   The left ventricular chamber size at rest is normal. The left ventricular   chamber size at peak stress is smaller. The peak stress wall motion scores   are 'hyperkinetic'. Normal rest wall motion. Hyperkinetic wall motion with   stress.       MEASUREMENTS/CALCULATIONS:                                   Dr. Zonia Kief. Dian Situ., MD   04/12/2017 01:58   Electronically signed byPM                 Physical Exam     Visit Vitals  BP 125/68 (BP 1 Location: Right arm, BP Patient Position: Supine)   Pulse 78   Temp 98.2 ??F (36.8 ??C)   Resp 20   Ht 5\' 2"  (1.575 m)   Wt 113.4 kg (250 lb)   SpO2 97%   BMI 45.73 kg/m??       Respiratory: Clear and equal breath sounds.  No respiratory distress.  Cardiovascular: Heart regular without murmer.      GI:  Abdomen soft non-tender without complaints of pain to palpation.    Clinical Impression/Diagnosis       ICD-10-CM ICD-9-CM   1. Chest pain, unspecified type R07.9 786.50       Disposition and Plan   Patient is discharged home in stable condition. They were advised to follow-up with primary care in one week to have a recheck. Copies of all ED testing and stress tests results were provided to the patient to take with them to their appointment.      It was discussed with the patient that their cardiac evaluation was unremarkable and does not indicate that immediate intervention is necessary. The patient was counseled that the testing is not 100% accurate and may generate false negatives.     Patient advised to return to the ED for any new or worsening symptoms.  The patient was personally evaluated by myself and discussed with Dr. Thana AtesMcCormack who agrees with the above assessment and plan.    Julien GirtErin E Kayonna Lawniczak, PA-C  April 12, 2017    My signature above authenticates this document and my orders, the final ??  diagnosis (es), discharge prescription (s), and instructions in the Epic ??  record.  If you have any questions please contact [phone].  ??  Nursing notes have been reviewed by the physician/ advanced practice ??  Clinician.    Dragon medical dictation software was used for portions of this report. Unintended voice recognition errors may occur.

## 2017-04-12 NOTE — ED Triage Notes (Signed)
Pt returns to the ER POV after being dc from ED OBS around 3pm today for the same complaint. Pt states that the chest pain hasnt gotten any better since she was dc but the pain got worse around 2215, 2230 she took 4 baby asa at home PTA. Pt states what was different is the pain was radiating down the L arm. Pt states she also noticed tonight that her ankles were swelling. Pt states the chest pain gets worse when she takes a big deep breath and when she moves. Pt states she had stress test done today which was normal. Pt states she did start smoking again when she got home. Pt states she also drank some caffeine and thought the caffeine had something to do with her CP. Denies cough. Pt states she has been having SOB. Pt smokes > 1 pack of cigarettes a day.

## 2017-04-13 ENCOUNTER — Emergency Department: Admit: 2017-04-13 | Payer: MEDICARE | Primary: Family Medicine

## 2017-04-13 ENCOUNTER — Inpatient Hospital Stay
Admit: 2017-04-13 | Discharge: 2017-04-13 | Disposition: A | Discharge status: Home / Self Care | Payer: MEDICARE | Attending: Emergency Medicine

## 2017-04-13 LAB — EKG 12-LEAD
Atrial Rate: 91 {beats}/min
Diagnosis: NORMAL
P Axis: 71 degrees
P-R Interval: 140 ms
Q-T Interval: 392 ms
QRS Duration: 96 ms
QTc Calculation (Bazett): 482 ms
R Axis: 28 degrees
T Axis: 68 degrees
Ventricular Rate: 91 {beats}/min

## 2017-04-13 LAB — CBC WITH AUTOMATED DIFF
BASOPHILS: 0.3 % (ref 0–3)
EOSINOPHILS: 2.3 % (ref 0–5)
HCT: 40.7 % (ref 37.0–50.0)
HGB: 14 gm/dl (ref 13.0–17.2)
IMMATURE GRANULOCYTES: 0.3 % (ref 0.0–3.0)
LYMPHOCYTES: 38.1 % (ref 28–48)
MCH: 30.2 pg (ref 25.4–34.6)
MCHC: 34.4 gm/dl (ref 30.0–36.0)
MCV: 87.9 fL (ref 80.0–98.0)
MONOCYTES: 7.8 % (ref 1–13)
MPV: 9.9 fL (ref 6.0–10.0)
NEUTROPHILS: 51.2 % (ref 34–64)
NRBC: 0 (ref 0–0)
PLATELET: 206 10*3/uL (ref 140–450)
RBC: 4.63 M/uL (ref 3.60–5.20)
RDW-SD: 42.5 (ref 36.4–46.3)
WBC: 7.9 10*3/uL (ref 4.0–11.0)

## 2017-04-13 LAB — METABOLIC PANEL, COMPREHENSIVE
ALT (SGPT): 17 U/L (ref 12–78)
AST (SGOT): 8 U/L — ABNORMAL LOW (ref 15–37)
Albumin: 3.6 gm/dl (ref 3.4–5.0)
Alk. phosphatase: 73 U/L (ref 45–117)
Anion gap: 9 mmol/L (ref 5–15)
BUN: 12 mg/dl (ref 7–25)
Bilirubin, total: 0.4 mg/dl (ref 0.2–1.0)
CO2: 21 mEq/L (ref 21–32)
Calcium: 8.9 mg/dl (ref 8.5–10.1)
Chloride: 110 mEq/L — ABNORMAL HIGH (ref 98–107)
Creatinine: 0.9 mg/dl (ref 0.6–1.3)
GFR est AA: >60
GFR est non-AA: >60
Glucose: 106 mg/dl (ref 74–106)
Potassium: 3.6 mEq/L (ref 3.5–5.1)
Protein, total: 7 gm/dl (ref 6.4–8.2)
Sodium: 140 mEq/L (ref 136–145)

## 2017-04-13 LAB — EKG, 12 LEAD, INITIAL
Atrial Rate: 91 {beats}/min
Calculated P Axis: 71 degrees
Calculated R Axis: 28 degrees
Calculated T Axis: 68 degrees
Diagnosis: NORMAL
P-R Interval: 140 ms
Q-T Interval: 392 ms
QRS Duration: 96 ms
QTC Calculation (Bezet): 482 ms
Ventricular Rate: 91 {beats}/min

## 2017-04-13 LAB — TROPONIN I
Troponin-I: <0.015 ng/ml (ref 0.000–0.045)
Troponin-I: 0.016 ng/ml (ref 0.000–0.045)
Troponin-I: <0.015 ng/ml (ref 0.000–0.045)

## 2017-04-13 LAB — NT-PRO BNP: NT pro-BNP: 133 pg/ml — ABNORMAL HIGH (ref 0.0–125.0)

## 2017-04-13 MED ORDER — ONDANSETRON (PF) 4 MG/2 ML INJECTION
4 mg/2 mL | Freq: Four times a day (QID) | INTRAMUSCULAR | Status: DC | PRN
Start: 2017-04-13 — End: 2017-04-13

## 2017-04-13 MED ORDER — GUAIFENESIN 100 MG/5 ML ORAL LIQUID
100 mg/5 mL | ORAL | 0 refills | Status: AC | PRN
Start: 2017-04-13 — End: ?

## 2017-04-13 MED ORDER — ACETAMINOPHEN 325 MG TABLET
325 mg | ORAL_TABLET | Freq: Four times a day (QID) | ORAL | 0 refills | Status: AC | PRN
Start: 2017-04-13 — End: ?

## 2017-04-13 MED ORDER — SODIUM CHLORIDE 0.9 % IJ SYRG
Freq: Once | INTRAMUSCULAR | Status: AC
Start: 2017-04-13 — End: 2017-04-13
  Administered 2017-04-13: 09:00:00 via INTRAVENOUS

## 2017-04-13 MED ORDER — ACETAMINOPHEN 325 MG TABLET
325 mg | Freq: Four times a day (QID) | ORAL | Status: DC | PRN
Start: 2017-04-13 — End: 2017-04-13
  Administered 2017-04-13: 18:00:00 via ORAL

## 2017-04-13 MED ORDER — NITROGLYCERIN 0.4 MG SUBLINGUAL TAB
0.4 mg | ORAL_TABLET | SUBLINGUAL | 0 refills | Status: DC | PRN
Start: 2017-04-13 — End: 2017-04-13

## 2017-04-13 MED ORDER — SODIUM CHLORIDE 0.9% BOLUS IV
0.9 % | INTRAVENOUS | Status: AC
Start: 2017-04-13 — End: 2017-04-13
  Administered 2017-04-13: 05:00:00 via INTRAVENOUS

## 2017-04-13 MED ORDER — IBUPROFEN 400 MG TAB
400 mg | Freq: Four times a day (QID) | ORAL | Status: DC | PRN
Start: 2017-04-13 — End: 2017-04-13

## 2017-04-13 MED ORDER — PROMETHAZINE 25 MG TAB
25 mg | Freq: Four times a day (QID) | ORAL | Status: DC | PRN
Start: 2017-04-13 — End: 2017-04-13

## 2017-04-13 MED ORDER — ALUM-MAG HYDROXIDE-SIMETH 200 MG-200 MG-20 MG/5 ML ORAL SUSP
200-200-20 mg/5 mL | ORAL | Status: DC | PRN
Start: 2017-04-13 — End: 2017-04-13

## 2017-04-13 MED ORDER — FAMOTIDINE (PF) 20 MG/2 ML IV
20 mg/2 mL | INTRAVENOUS | Status: AC
Start: 2017-04-13 — End: 2017-04-13
  Administered 2017-04-13: 05:00:00 via INTRAVENOUS

## 2017-04-13 MED ORDER — ENOXAPARIN 40 MG/0.4 ML SUB-Q SYRINGE
40 mg/0.4 mL | Freq: Every day | SUBCUTANEOUS | Status: DC
Start: 2017-04-13 — End: 2017-04-13

## 2017-04-13 MED ORDER — MORPHINE 4 MG/ML SYRINGE
4 mg/mL | INTRAMUSCULAR | Status: AC
Start: 2017-04-13 — End: 2017-04-13
  Administered 2017-04-13: 05:00:00 via INTRAVENOUS

## 2017-04-13 MED ORDER — POLYETHYLENE GLYCOL 3350 17 GRAM (100 %) ORAL POWDER PACKET
17 gram | Freq: Every day | ORAL | Status: DC | PRN
Start: 2017-04-13 — End: 2017-04-13

## 2017-04-13 MED ORDER — ALUM-MAG HYDROXIDE-SIMETH 200 MG-200 MG-20 MG/5 ML ORAL SUSP
200-200-20 mg/5 mL | ORAL | 0 refills | Status: AC | PRN
Start: 2017-04-13 — End: ?

## 2017-04-13 MED ORDER — NITROGLYCERIN 0.4 MG SUBLINGUAL TAB
0.4 mg | SUBLINGUAL | Status: DC | PRN
Start: 2017-04-13 — End: 2017-04-13
  Administered 2017-04-13: 05:00:00 via SUBLINGUAL

## 2017-04-13 MED ORDER — SODIUM CHLORIDE 0.9 % IV
INTRAVENOUS | Status: AC
Start: 2017-04-13 — End: 2017-04-13
  Administered 2017-04-13: 08:00:00 via INTRAVENOUS

## 2017-04-13 MED ORDER — GUAIFENESIN 100 MG/5 ML ORAL LIQUID
100 mg/5 mL | ORAL | Status: DC | PRN
Start: 2017-04-13 — End: 2017-04-13

## 2017-04-13 MED ORDER — IOPAMIDOL 76 % IV SOLN
370 mg iodine /mL (76 %) | Freq: Once | INTRAVENOUS | Status: AC
Start: 2017-04-13 — End: 2017-04-13
  Administered 2017-04-13: 05:00:00 via INTRAVENOUS

## 2017-04-13 MED ORDER — ONDANSETRON (PF) 4 MG/2 ML INJECTION
4 mg/2 mL | Freq: Once | INTRAMUSCULAR | Status: AC
Start: 2017-04-13 — End: 2017-04-13
  Administered 2017-04-13: 05:00:00 via INTRAVENOUS

## 2017-04-13 MED ORDER — DOCUSATE SODIUM 100 MG CAP
100 mg | Freq: Two times a day (BID) | ORAL | Status: DC | PRN
Start: 2017-04-13 — End: 2017-04-13

## 2017-04-13 MED ORDER — BENZONATATE 100 MG CAP
100 mg | Freq: Three times a day (TID) | ORAL | Status: DC | PRN
Start: 2017-04-13 — End: 2017-04-13

## 2017-04-13 MED ORDER — PANTOPRAZOLE 40 MG TAB, DELAYED RELEASE
40 mg | ORAL_TABLET | Freq: Every day | ORAL | 0 refills | Status: AC
Start: 2017-04-13 — End: ?

## 2017-04-13 MED ORDER — MORPHINE 4 MG/ML SYRINGE
4 mg/mL | INTRAMUSCULAR | Status: DC | PRN
Start: 2017-04-13 — End: 2017-04-13
  Administered 2017-04-13 (×2): via INTRAVENOUS

## 2017-04-13 MED ORDER — ASPIRIN 81 MG CHEWABLE TAB
81 mg | ORAL | Status: DC
Start: 2017-04-13 — End: 2017-04-13
  Administered 2017-04-13: 04:00:00 via ORAL

## 2017-04-13 MED ORDER — BISACODYL 10 MG RECTAL SUPPOSITORY
10 mg | Freq: Every day | RECTAL | Status: DC | PRN
Start: 2017-04-13 — End: 2017-04-13

## 2017-04-13 MED FILL — MORPHINE 4 MG/ML SYRINGE: 4 mg/mL | INTRAMUSCULAR | Qty: 1

## 2017-04-13 MED FILL — ONDANSETRON (PF) 4 MG/2 ML INJECTION: 4 mg/2 mL | INTRAMUSCULAR | Qty: 2

## 2017-04-13 MED FILL — ACETAMINOPHEN 325 MG TABLET: 325 mg | ORAL | Qty: 2

## 2017-04-13 MED FILL — NITROGLYCERIN 0.4 MG SUBLINGUAL TAB: 0.4 mg | SUBLINGUAL | Qty: 1

## 2017-04-13 MED FILL — FAMOTIDINE (PF) 20 MG/2 ML IV: 20 mg/2 mL | INTRAVENOUS | Qty: 2

## 2017-04-13 MED FILL — ISOVUE-370  76 % INTRAVENOUS SOLUTION: 370 mg iodine /mL (76 %) | INTRAVENOUS | Qty: 90
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
## 2017-04-13 NOTE — Discharge Summary (Signed)
Discharge Summary       Patient: Jennifer Downs Age: 57 y.o. DOB: [birth year] MR#: [id number] SSN: [id number]  PCP on record: None  Admit date: 04/12/2017  Discharge date: 04/13/2017    Admission Diagnoses:   Chest pain [R07.9]  -    Discharge Diagnoses:      Chest pain  Tobacco dependence  Ascending aortic aneurysm 4.1 cm  Chronic back pain s/p stimulator        Medical History   Patient is a very pleasant 57yo F who comes in with chest pain. Patient has been having mid sternal chest pain and was placed in ER observation yesterday. She had a stress echo yesterday that was negative for ischemia. Patient was discharged home. She had recurrent chest pain today, radiating to left arm. No syncope. No SOB. No fever. No chills. She continues to smoke cigarettes.           Hospital Course by Problem   Chest pain  Tobacco dependence  Ascending aortic aneurysm 4.1 cm  Chronic back pain s/p stimulator  Cardiology consult  Atypical chest pain with negative stress test, troponin, non-ischemic EKG on my review.  Overall suspect non-cardiac chest pain.  Will try GI cocktail.  Anxiety may be playing a role as pain increased with emotional stress.  Also, can consider coronary CTA as outpatient for definitive evaluation of coronary anatomy to help guide primary prevention strategies.  Discussed smoking cessation .  Stable for discharge to home today.            Today's examination of the patient revealed:     Objective:   VS:   Visit Vitals  BP 106/70 (BP 1 Location: Left arm, BP Patient Position: At rest;Supine)   Pulse 67   Temp 98 ??F (36.7 ??C)   Resp 18   Ht 5' 2" (1.575 m)   Wt 113.4 kg (250 lb)   SpO2 98%   BMI 45.73 kg/m??      Tmax/24hrs: Temp (24hrs), Avg:98.2 ??F (36.8 ??C), Min:98 ??F (36.7 ??C), Max:98.3 ??F (36.8 ??C)     Input/Output:     Intake/Output Summary (Last 24 hours) at 04/13/2017 1413  Last data filed at 04/13/2017 0350  Gross per 24 hour   Intake 1000 ml   Output ???   Net 1000 ml       General appearance: no  distress  Eyes: sclera anicteric  ENT: no oral lesions, thyroid normal  Skin: no spider angiomata, jaundice,   Respiratory: clear to auscultation bilaterally  Cardiovascular: regular heart rate, no murmurs, no JVD  Abdomen: soft, non-tender, no rebound  Extremities: no muscle wasting, no gross arthritic changes  GU: not examined  Neurologic: alert and oriented, cranial nerves grossly intact,         Labs:    Recent Results (from the past 24 hour(s))   EKG, 12 LEAD, INITIAL    Collection Time: 04/12/17 11:30 PM   Result Value Ref Range    Ventricular Rate 91 BPM    Atrial Rate 91 BPM    P-R Interval 140 ms    QRS Duration 96 ms    Q-T Interval 392 ms    QTC Calculation (Bezet) 482 ms    Calculated P Axis 71 degrees    Calculated R Axis 28 degrees    Calculated T Axis 68 degrees    Diagnosis       Normal sinus rhythm  Prolonged QT  When compared with ECG of 12-Apr-2017  03:43,  No significant change was found  Confirmed by Marrion Coy, M.D., Ellin Saba. ([id number]) on 04/13/2017 7:00:54 AM     CBC WITH AUTOMATED DIFF    Collection Time: 04/12/17 11:45 PM   Result Value Ref Range    WBC 7.9 4.0 - 11.0 1000/mm3    RBC 4.63 3.60 - 5.20 M/uL    HGB 14.0 13.0 - 17.2 gm/dl    HCT 40.7 37.0 - 50.0 %    MCV 87.9 80.0 - 98.0 fL    MCH 30.2 25.4 - 34.6 pg    MCHC 34.4 30.0 - 36.0 gm/dl    PLATELET 206 140 - 450 1000/mm3    MPV 9.9 6.0 - 10.0 fL    RDW-SD 42.5 36.4 - 46.3      NRBC 0 0 - 0      IMMATURE GRANULOCYTES 0.3 0.0 - 3.0 %    NEUTROPHILS 51.2 34 - 64 %    LYMPHOCYTES 38.1 28 - 48 %    MONOCYTES 7.8 1 - 13 %    EOSINOPHILS 2.3 0 - 5 %    BASOPHILS 0.3 0 - 3 %   METABOLIC PANEL, COMPREHENSIVE    Collection Time: 04/12/17 11:45 PM   Result Value Ref Range    Sodium 140 136 - 145 mEq/L    Potassium 3.6 3.5 - 5.1 mEq/L    Chloride 110 (H) 98 - 107 mEq/L    CO2 21 21 - 32 mEq/L    Glucose 106 74 - 106 mg/dl    BUN 12 7 - 25 mg/dl    Creatinine 0.9 0.6 - 1.3 mg/dl    GFR est AA >60.0      GFR est non-AA >60      Calcium 8.9 8.5 - 10.1 mg/dl     AST (SGOT) 8 (L) 15 - 37 U/L    ALT (SGPT) 17 12 - 78 U/L    Alk. phosphatase 73 45 - 117 U/L    Bilirubin, total 0.4 0.2 - 1.0 mg/dl    Protein, total 7.0 6.4 - 8.2 gm/dl    Albumin 3.6 3.4 - 5.0 gm/dl    Anion gap 9 5 - 15 mmol/L   TROPONIN I    Collection Time: 04/12/17 11:45 PM   Result Value Ref Range    Troponin-I <0.015 0.000 - 0.045 ng/ml   NT-PRO BNP    Collection Time: 04/12/17 11:45 PM   Result Value Ref Range    NT pro-BNP 133.0 (H) 0.0 - 125.0 pg/ml   TROPONIN I    Collection Time: 04/13/17  4:00 AM   Result Value Ref Range    Troponin-I 0.016 0.000 - 0.045 ng/ml   TROPONIN I    Collection Time: 04/13/17  5:22 AM   Result Value Ref Range    Troponin-I <0.015 0.000 - 0.045 ng/ml     Additional Data Reviewed:      XR Results (most recent):  Results from Hospital Encounter encounter on 04/12/17   XR CHEST PA LAT    Narrative PA lateral chest    Comparison: None    Indication: Chest pain    Findings: Chest reveals normal Cardiac silhouette. Lungs are clear of any  failure, acute infiltrates, or other abnormality. Bony structures are normal.  Costophrenic angles are clear. TENS electrical stimulating device in place.      Impression Impression: Negative chest.         MRI Results (most recent):  No results found for  this or any previous visit.    CT Results (most recent):  Results from Hospital Encounter encounter on 04/12/17   CTA CHEST W OR W WO CONT    Narrative EXAMINATION: CTA CHEST W OR W WO CONT    CLINICAL INDICATION: Chest pain, acute, pulmonary embolism (PE) suspected    COMPARISON:  None.    TECHNIQUE: All CT exams at this facility use one or more dose reduction  techniques including automatic exposure control, ma/kV  adjustment per patient's  size, or iterative reconstruction technique. DICOM format image data available  to non-affiliated external healthcare facilities or entities on a secure, media  free, reciprocal searchable basis with patient authorization for 12 months  following the date  of the study.    Multiple contiguous axial CT images were obtained at 78m increments from the  apex of the lungs to the lung bases with intravenous contrast. 3 D MIP images  reconstructed multiple projections.    FINDINGS:   Lungs/Soft tissue: Mild subpleural discoid atelectasis in the dependent upper  and lower lobes.     Vascular: Bovine arch, normal variant. Ascending aortic arch 4.1 cm. No  pulmonary artery embolism.    Mediastinum:  Small mediastinal lymph nodes. Small hilar lymph nodes.    Thyroid : 6 mm low-density right thyroid gland inferiorly. No thyromegaly.    Axilla: No adenopathy seen in axilla,      Bones: No significant bony lesions.    Miscellaneous: Thoracic epidural neurostimulator.    Upper abdomen:  Unremarkable.      Impression IMPRESSION:     1. No pulmonary artery embolism. Borderline aortic aneurysm.  2. 6 mm low-density right thyroid gland.  3. No consolidation.               Condition:   Stable    Diet:   Regular  Disposition:    [x]Home   []Home with Home Health   []SNF/NH   []Rehab   []Home with family   []Alternate Facility:____________________      Discharge Medications:        My Medications      START taking these medications      Instructions Each Dose to Equal Morning Noon Evening Bedtime   acetaminophen 325 mg tablet  Commonly known as:  TYLENOL    Your last dose was:      Your next dose is:          Take 2 Tabs by mouth every six (6) hours as needed for Pain.   650 mg                 alum-mag hydroxide-simeth 200-200-20 mg/5 mL Susp  Commonly known as:  MYLANTA    Your last dose was:      Your next dose is:          Take 30 mL by mouth every four (4) hours as needed (GERD).   30 mL                 guaiFENesin 100 mg/5 mL liquid  Commonly known as:  ROBITUSSIN    Your last dose was:      Your next dose is:          Take 5 mL by mouth every four (4) hours as needed for Cough.   100 mg                 pantoprazole 40 mg tablet  Commonly known as:  PROTONIX    Your last dose was:       Your next dose is:          Take 1 Tab by mouth daily.   40 mg                    STOP taking these medications    ibuprofen 200 mg tablet  Commonly known as:  MOTRIN              Where to Get Your Medications      These medications were sent to [facility], [place], [place] [address]    Phone:  [phone]   ?? acetaminophen 325 mg tablet  ?? alum-mag hydroxide-simeth 200-200-20 mg/5 mL Susp  ?? guaiFENesin 100 mg/5 mL liquid  ?? pantoprazole 40 mg tablet             Follow-up Appointments and instructions:   Your PCP: None, within 3-5 days  Follow-up Information     Follow up With Specialties Details Why Contact Info    None    None ([phone]) Patient stated that they have no PCP              Consults: Cardiology    Treatment Team: Treatment Team: Attending Provider: Otila Kluver, MD; Physician Assistant: Sanjuan Dame; Consulting Provider: Hester Mates. Linton Rump, DO; Hospitalist: Otila Kluver, MD; Primary Nurse: Lafe Garin; Unit Clerk: [name] D        Thank you Dr None for allowing [name] to participate in the care of [employer]       >42 minutes spent coordinating this discharge (review instructions/follow-up, prescriptions)    Signed:    [facility]  Otila Kluver, MD  04/13/2017  2:13 PM

## 2017-04-13 NOTE — ED Notes (Signed)
Pt ambulated to bathroom with steady gait.
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
## 2017-04-13 NOTE — Progress Notes (Signed)
Patient was brought over by wheelchair. C/o stabbing CP radiating to her back. Patient is placed on cardiac monitor.

## 2017-04-13 NOTE — ED Provider Notes (Signed)
[facility]  Emergency Department Treatment Report    Patient: Jennifer Downs Age: 57 y.o. Sex: female    Date of Birth: [birth year] Admit Date: 04/12/2017 PCP: None   MRN: [id number]  CSN: [id number]  Attn: Terald Sleeper, MD   Room: ER31/ER31 Time Dictated: 12:27 AM APP: Fuller Song, PA-C       Chief Complaint    chest pain  History of Present Illness   57 y.o. female who presents stating she has had chest pain since sometime last night she does not remember exactly when it started she was seen here and evaluated and signed to the observation unit where she went through chest pain protocol stress echo that she states she was told was unremarkable.  She states her pain never really went away and then got worse tonight she describes a sharp pain in the center of her chest feels like it radiates to her back it is pleuritic and she feels short of breath with it.  No fever no cough she has had no diaphoresis no nausea or vomiting.  She feels like her legs have been swollen that started tonight as well.  No travel not on any hormone replacement.  She did take 4 baby aspirin to 2230.    Review of Systems   Review of Systems   Constitutional: Negative for chills and fever.   HENT:        No URI symptoms   Eyes:        No visual change   Respiratory: Positive for cough and shortness of breath.    Cardiovascular: Positive for chest pain and leg swelling. Negative for palpitations.        Sharp pain retrosternal radiating to back; no diaphoresis no nausea   Gastrointestinal: Negative for abdominal pain, nausea and vomiting.   Musculoskeletal: Negative for neck pain.   Skin: Negative for rash.   Neurological: Negative for dizziness, sensory change, weakness and headaches.   All other systems reviewed and are negative.      Past Medical/Surgical History     Past Medical History:   Diagnosis Date   ??? Back pain    ??? CAD (coronary artery disease)    ??? Fibromyalgia    ??? Migraines     ??? Mitral valve disorder    ??? Seizures ([place])    ??? Stroke [facility])      Past Surgical History:   Procedure Laterality Date   ??? HX HYSTERECTOMY     ??? HX ORTHOPAEDIC      carpal tunnel   ??? NEUROLOGICAL PROCEDURE UNLISTED      placement of neurostimulator       Social History     Social History     Socioeconomic History   ??? Marital status: LEGALLY SEPARATED     Spouse name: Not on file   ??? Number of children: Not on file   ??? Years of education: Not on file   ??? Highest education level: Not on file   Tobacco Use   ??? Smoking status: Current Every Day Smoker     Packs/day: 1.00   ??? Smokeless tobacco: Never Used   Substance and Sexual Activity   ??? Alcohol use: Yes     Comment: occasional   ??? Drug use: Yes     Types: Marijuana   Smoking cessation strongly encouraged alcohol occasionally she denies any street drug specifically no cocaine    Family History   History reviewed. No  pertinent family history.  Father with a history of MI and mom with a history of PE  Current Medications     Current Facility-Administered Medications   Medication Dose Route Frequency Provider Last Rate Last Dose   ??? nitroglycerin (NITROSTAT) tablet 0.4 mg  0.4 mg SubLINGual PRN Leatrice Jewels C, PA-C   0.4 mg at 04/13/17 0042   ??? sodium chloride (NS) flush 5-10 mL  5-10 mL IntraVENous ONCE Fuller Song, PA-C           Allergies     Allergies   Allergen Reactions   ??? Ciprofloxacin Other (comments)     Muscle cramping         Physical Exam     Visit Vitals  BP (!) 128/94   Pulse 82   Temp 98.2 ??F (36.8 ??C)   Resp 14   Ht '5\' 2"'  (1.575 m)   Wt 113.4 kg (250 lb)   SpO2 98%   BMI 45.73 kg/m??     Physical Exam   Constitutional: She is oriented to person, place, and time.   Pleasant female   HENT:   Mouth/Throat: Oropharynx is clear and moist.   Eyes: Pupils are equal, round, and reactive to light. Conjunctivae are normal. No scleral icterus.   Neck: Neck supple.   Nontender   Cardiovascular: Normal rate, regular rhythm and intact distal pulses.    Pulmonary/Chest: Effort normal and breath sounds normal. No respiratory distress.   Abdominal: Soft. There is no tenderness.   No organomegaly no masses no pulsatile masses   Musculoskeletal:   Extremity symmetrical warm dry well-perfused and nontender; no pitting edema symmetrical pulses   Neurological: She is alert and oriented to person, place, and time. No cranial nerve deficit.   Skin: Skin is warm and dry. No rash noted.   Psychiatric: Memory, affect and judgment normal.   Vitals reviewed.      Impression and Management Plan   57 year old female presents for evaluation of chest pain that she states is been constant since last night but worse today.  Presents for evaluation.  Has undergone stress echo and trending of troponins yesterday.  At this time she is been placed on blood pressure, pulse oximetry cardiac monitors.  Started an IV get some baseline lab work check blood counts electrolytes we will check a troponin again I will also put her in for CTA of her chest to evaluate for possible PE given pleuritic symptoms and description of the sharp pain in the center of her chest.  EKG will be obtained we will try some sublingual nitros.  We will also try some morphine Zofran intravenously IV fluid hydration and reassess.    Diagnostic Studies   Lab:   Recent Results (from the past 12 hour(s))   CBC WITH AUTOMATED DIFF    Collection Time: 04/12/17 11:45 PM   Result Value Ref Range    WBC 7.9 4.0 - 11.0 1000/mm3    RBC 4.63 3.60 - 5.20 M/uL    HGB 14.0 13.0 - 17.2 gm/dl    HCT 40.7 37.0 - 50.0 %    MCV 87.9 80.0 - 98.0 fL    MCH 30.2 25.4 - 34.6 pg    MCHC 34.4 30.0 - 36.0 gm/dl    PLATELET 206 140 - 450 1000/mm3    MPV 9.9 6.0 - 10.0 fL    RDW-SD 42.5 36.4 - 46.3      NRBC 0 0 - 0  IMMATURE GRANULOCYTES 0.3 0.0 - 3.0 %    NEUTROPHILS 51.2 34 - 64 %    LYMPHOCYTES 38.1 28 - 48 %    MONOCYTES 7.8 1 - 13 %    EOSINOPHILS 2.3 0 - 5 %    BASOPHILS 0.3 0 - 3 %   METABOLIC PANEL, COMPREHENSIVE     Collection Time: 04/12/17 11:45 PM   Result Value Ref Range    Sodium 140 136 - 145 mEq/L    Potassium 3.6 3.5 - 5.1 mEq/L    Chloride 110 (H) 98 - 107 mEq/L    CO2 21 21 - 32 mEq/L    Glucose 106 74 - 106 mg/dl    BUN 12 7 - 25 mg/dl    Creatinine 0.9 0.6 - 1.3 mg/dl    GFR est AA >60.0      GFR est non-AA >60      Calcium 8.9 8.5 - 10.1 mg/dl    AST (SGOT) 8 (L) 15 - 37 U/L    ALT (SGPT) 17 12 - 78 U/L    Alk. phosphatase 73 45 - 117 U/L    Bilirubin, total 0.4 0.2 - 1.0 mg/dl    Protein, total 7.0 6.4 - 8.2 gm/dl    Albumin 3.6 3.4 - 5.0 gm/dl    Anion gap 9 5 - 15 mmol/L   TROPONIN I    Collection Time: 04/12/17 11:45 PM   Result Value Ref Range    Troponin-I <0.015 0.000 - 0.045 ng/ml   NT-PRO BNP    Collection Time: 04/12/17 11:45 PM   Result Value Ref Range    NT pro-BNP 133.0 (H) 0.0 - 125.0 pg/ml   EKG shows a sinus rhythm with a ventricular rate of 91 a PR of 140 QRS 96 a QT of 392 and a QTC of 42 with no definitive evidence to suggest acute ischemia as read by Dr. Terald Sleeper    Cardiac monitoring was obtained with the patient's complaint of chest pain and this showed patient in sinus rhythm as read by Theodore Demark PA-C    Imaging:    Xr Chest Pa Lat    Result Date: 04/12/2017  PA lateral chest Comparison: None Indication: Chest pain Findings: Chest reveals normal Cardiac silhouette. Lungs are clear of any failure, acute infiltrates, or other abnormality. Bony structures are normal. Costophrenic angles are clear. TENS electrical stimulating device in place.     Impression: Negative chest.     Cta Chest W Or W Wo Cont    Result Date: 04/13/2017  EXAMINATION: CTA CHEST W OR W WO CONT CLINICAL INDICATION: Chest pain, acute, pulmonary embolism (PE) suspected COMPARISON:  None. TECHNIQUE: All CT exams at this facility use one or more dose reduction techniques including automatic exposure control, ma/kV  adjustment per patient's size, or iterative reconstruction technique. DICOM format image data  available to non-affiliated external healthcare facilities or entities on a secure, media free, reciprocal searchable basis with patient authorization for 12 months following the date of the study. Multiple contiguous axial CT images were obtained at 79m increments from the apex of the lungs to the lung bases with intravenous contrast. 3 D MIP images reconstructed multiple projections. FINDINGS: Lungs/Soft tissue: Mild subpleural discoid atelectasis in the dependent upper and lower lobes. Vascular: Bovine arch, normal variant. Ascending aortic arch 4.1 cm. No pulmonary artery embolism. Mediastinum:  Small mediastinal lymph nodes. Small hilar lymph nodes. Thyroid : 6 mm low-density right thyroid gland inferiorly. No thyromegaly.  Axilla: No adenopathy seen in axilla,  Bones: No significant bony lesions. Miscellaneous: Thoracic epidural neurostimulator. Upper abdomen:  Unremarkable.     IMPRESSION:  1. No pulmonary artery embolism. Borderline aortic aneurysm. 2. 6 mm low-density right thyroid gland. 3. No consolidation.     Procedures    Medical Decision Making/ED Course   Findings reviewed with patient.  She is more comfortable now.  She did have some relief with the sublingual nitro.  She is already taken aspirin at home.  At this time discussed the 4.1 cm aneurysm seen the need for outpatient follow-up with this.  Given her recurrence of chest pain really persistent since yesterday I do think she needs to come in and see cardiology.  I discussed the case with Dr. Marolyn Haller on-call for [facility] who kindly agrees to consult on patient.  Dr. Terald Sleeper spoke with Dr. Clydene Fake for [facility] hospitalist who kindly accepts this patient for assignment hospital observation telemetry.  Medications   sodium chloride (NS) flush 5-10 mL (has no administration in time range)   nitroglycerin (NITROSTAT) tablet 0.4 mg (0.4 mg SubLINGual Given 04/13/17 0042)    sodium chloride 0.9 % bolus infusion 1,000 mL (1,000 mL IntraVENous New Bag 04/13/17 0037)   morphine injection 4 mg (4 mg IntraVENous Given 04/13/17 0039)   ondansetron (ZOFRAN) injection 4 mg (4 mg IntraVENous Given 04/13/17 0038)   famotidine (PF) (PEPCID) injection 20 mg (20 mg IntraVENous Given 04/13/17 0038)   iopamidol (ISOVUE-370) 76 % injection 90 mL (90 mL IntraVENous Given 04/13/17 0102)       Final Diagnosis       ICD-10-CM ICD-9-CM   1. Acute chest pain R07.9 786.50   2. Aortic aneurysm without rupture, unspecified portion of aorta (HCC) I71.9 441.9       Disposition   Patient admitted in stable condition.  Patient examined and evaluated by myself and Dr. Crissie Sickles who agrees with above assessment and plan      Leatrice Jewels, PA-C  April 13, 2017    My signature above authenticates this document and my orders, the final ??  diagnosis (es), discharge prescription (s), and instructions in the Epic ??  record.  If you have any questions please contact [phone].  Dragon medical dictation software was used for portions of this report. Unintended voice recognition errors may occur.

## 2017-04-13 NOTE — ED Notes (Signed)
TRANSFER - IN REPORT:    Verbal report received from Mimi(name) on Yvonnia L Presas  being received from ED OBS(unit) for routine progression of care      Report consisted of patient???s Situation, Background, Assessment and   Recommendations(SBAR).     Information from the following report(s) SBAR, Kardex, MAR, Recent Results and Cardiac Rhythm NSR was reviewed with the receiving nurse.    Opportunity for questions and clarification was provided.      Assessment completed upon patient???s care assumed.

## 2017-04-13 NOTE — ED Notes (Signed)
Dr. Avon GullySposato is at the bedside for consult with pt.

## 2017-04-13 NOTE — H&P (Signed)
Medicine History and Physical  Patient: Jennifer Downs   Age:  57 y.o.    Assessment     Chest pain  Tobacco dependence  Ascending aortic aneurysm 4.1 cm  Chronic back pain s/p stimulator    Plan     Telemetry. Monitor cardiac enzymes.   Counseled smoking cessation  Cardiology consult  DVT ppx    Further recommendations based on clinical course. Discussed with patient current assessment and plan and answered all question. Thank you very much for allowing me to participate in this very pleasant patient's care.     DISPO  -Pt to be admitted  at this time for reasons addressed above, continued hospitalization for ongoing assessment and treatment indicated     Anticipated Date of Discharge: 1-2 days  Anticipated Disposition (home, SNF) : home         HPI:   Jennifer Downs is a 57 y.o. year old female who presents with chest pain.    Patient is a very pleasant 57yo F who comes in with chest pain. Patient has been having mid sternal chest pain and was placed in ER observation yesterday. She had a stress echo yesterday that was negative for ischemia. Patient was discharged home. She had recurrent chest pain today, radiating to left arm. No syncope. No SOB. No fever. No chills. She continues to smoke cigarettes.     Review of Systems - 12 point ROS done pertinent positive and negative as stated in HPI      Past Medical History:  Past Medical History:   Diagnosis Date   ??? Back pain    ??? CAD (coronary artery disease)    ??? Fibromyalgia    ??? Migraines    ??? Mitral valve disorder    ??? Seizures (HCC)    ??? Stroke [facility])        Past Surgical History:  Past Surgical History:   Procedure Laterality Date   ??? HX HYSTERECTOMY     ??? HX ORTHOPAEDIC      carpal tunnel   ??? NEUROLOGICAL PROCEDURE UNLISTED      placement of neurostimulator       Family History:  History reviewed. No pertinent family history.    Social History:  Social History     Socioeconomic History   ??? Marital status: LEGALLY SEPARATED      Spouse name: Not on file   ??? Number of children: Not on file   ??? Years of education: Not on file   ??? Highest education level: Not on file   Tobacco Use   ??? Smoking status: Current Every Day Smoker     Packs/day: 1.00   ??? Smokeless tobacco: Never Used   Substance and Sexual Activity   ??? Alcohol use: Yes     Comment: occasional   ??? Drug use: Yes     Types: Marijuana       Home Medications:  Prior to Admission medications    Not on File       Allergies:  Allergies   Allergen Reactions   ??? Ciprofloxacin Other (comments)     Muscle cramping             Physical Exam:     Visit Vitals  BP (!) 128/94   Pulse 82   Temp 98.2 ??F (36.8 ??C)   Resp 14   Ht 5\' 2"  (1.575 m)   Wt 113.4 kg (250 lb)   SpO2 98%   BMI 45.73  kg/m??       Physical Exam:  General appearance: alert, cooperative   Head: Normocephalic, without obvious abnormality, atraumatic  Neck: supple, trachea midline  Lungs: clear to auscultation bilaterally  Heart: regular rate and rhythm, S1, S2 normal   Abdomen: soft, non-tender. Bowel sounds normal.    Extremities: extremities no edema  Skin: Skin dry  Neurologic: Grossly normal  PSY: mood and affect normal, appropriately behaved     Intake and Output:  Current Shift:  No intake/output data recorded.  Last three shifts:  No intake/output data recorded.    Lab/Data Reviewed:  BMP:   Lab Results   Component Value Date/Time    NA 140 04/12/2017 11:45 PM    K 3.6 04/12/2017 11:45 PM    CL 110 (H) 04/12/2017 11:45 PM    CO2 21 04/12/2017 11:45 PM    AGAP 9 04/12/2017 11:45 PM    GLU 106 04/12/2017 11:45 PM    BUN 12 04/12/2017 11:45 PM    CREA 0.9 04/12/2017 11:45 PM    GFRAA >60.0 04/12/2017 11:45 PM    GFRNA >60 04/12/2017 11:45 PM     CMP:   Lab Results   Component Value Date/Time    NA 140 04/12/2017 11:45 PM    K 3.6 04/12/2017 11:45 PM    CL 110 (H) 04/12/2017 11:45 PM    CO2 21 04/12/2017 11:45 PM    AGAP 9 04/12/2017 11:45 PM    GLU 106 04/12/2017 11:45 PM    BUN 12 04/12/2017 11:45 PM     CREA 0.9 04/12/2017 11:45 PM    GFRAA >60.0 04/12/2017 11:45 PM    GFRNA >60 04/12/2017 11:45 PM    CA 8.9 04/12/2017 11:45 PM    ALB 3.6 04/12/2017 11:45 PM    TP 7.0 04/12/2017 11:45 PM    SGOT 8 (L) 04/12/2017 11:45 PM    ALT 17 04/12/2017 11:45 PM     CBC:   Lab Results   Component Value Date/Time    WBC 7.9 04/12/2017 11:45 PM    HGB 14.0 04/12/2017 11:45 PM    HCT 40.7 04/12/2017 11:45 PM    PLT 206 04/12/2017 11:45 PM     All Cardiac Markers in the last 24 hours:   Lab Results   Component Value Date/Time    TROPT <0.015 04/12/2017 11:45 PM     Recent Glucose Results:   Lab Results   Component Value Date/Time    GLU 106 04/12/2017 11:45 PM     ABG: No results found for: PH, PHI, PCO2, PCO2I, PO2, PO2I, HCO3, HCO3I, FIO2, FIO2I  COAGS: No results found for: APTT, PTP, INR  Liver Panel:   Lab Results   Component Value Date/Time    ALB 3.6 04/12/2017 11:45 PM    TP 7.0 04/12/2017 11:45 PM    SGOT 8 (L) 04/12/2017 11:45 PM    ALT 17 04/12/2017 11:45 PM    AP 73 04/12/2017 11:45 PM     Pancreatic Markers: No results found for: AMYLPOCT, AML, LIPPOCT, LPSE    CT Results  (Last 48 hours)               04/13/17 0105  CTA CHEST W OR W WO CONT Final result    Impression:  IMPRESSION:       1. No pulmonary artery embolism. Borderline aortic aneurysm.   2. 6 mm low-density right thyroid gland.   3. No consolidation.  Narrative:  EXAMINATION: CTA CHEST W OR W WO CONT       CLINICAL INDICATION: Chest pain, acute, pulmonary embolism (PE) suspected       COMPARISON:  None.       TECHNIQUE: All CT exams at this facility use one or more dose reduction   techniques including automatic exposure control, ma/kV  adjustment per patient's   size, or iterative reconstruction technique. DICOM format image data available   to non-affiliated external healthcare facilities or entities on a secure, media   free, reciprocal searchable basis with patient authorization for 12 months   following the date of the study.        Multiple contiguous axial CT images were obtained at 3mm increments from the   apex of the lungs to the lung bases with intravenous contrast. 3 D MIP images   reconstructed multiple projections.       FINDINGS:    Lungs/Soft tissue: Mild subpleural discoid atelectasis in the dependent upper   and lower lobes.        Vascular: Bovine arch, normal variant. Ascending aortic arch 4.1 cm. No   pulmonary artery embolism.       Mediastinum:  Small mediastinal lymph nodes. Small hilar lymph nodes.       Thyroid : 6 mm low-density right thyroid gland inferiorly. No thyromegaly.       Axilla: No adenopathy seen in axilla,         Bones: No significant bony lesions.       Miscellaneous: Thoracic epidural neurostimulator.       Upper abdomen:  Unremarkable.                     EKG: tracing reviewed  independently - NSR      Meryle Ready, MD  April 13, 2017

## 2017-04-13 NOTE — ED Notes (Signed)
Pt being admitted to the hospital, awaiting tele bed, no tele beds at this time, report called to ED OBS for overflow placement.    TRANSFER - OUT REPORT:    Verbal report given to Mimi(name) on Sheyla L Klasen  being transferred to ED OBS 13(unit) for routine progression of care       Report consisted of patient???s Situation, Background, Assessment and   Recommendations(SBAR).     Information from the following report(s) SBAR was reviewed with the receiving nurse.    Lines:   Peripheral IV 04/12/17 Right Arm (Active)   Phlebitis Assessment 0 04/13/2017 12:14 AM   Infiltration Assessment 0 04/13/2017 12:14 AM   Dressing Status Clean, dry, & intact 04/13/2017 12:14 AM   Dressing Type Transparent 04/13/2017 12:14 AM   Hub Color/Line Status Pink;Flushed 04/13/2017 12:14 AM   Action Taken Blood drawn 04/13/2017 12:14 AM        Opportunity for questions and clarification was provided.      Patient transported with:   [employer]

## 2017-04-13 NOTE — ED Notes (Signed)
Paged Dr. Terrace ArabiaYan regarding updated plan of care for pt.  Awaiting to hear back from Dr. Terrace ArabiaYan.

## 2017-04-13 NOTE — ED Notes (Signed)
Repaged Dr. Terrace ArabiaYan regarding updated plan of care for pt. OK for regular diet for pt.  Awaiting hot lunch tray.   Awaiting dispo from Dr. Terrace ArabiaYan.

## 2017-04-13 NOTE — ED Notes (Signed)
Pt assessed, updated on the plan of care, and NPO status.  Awaiting CVAL consult.  Pt is resting in bed with no distress noted.  Pt is encouraged to use call bell for needs.  Call bell within reach.

## 2017-04-13 NOTE — ED Notes (Signed)
Pt is c/o PIV site tender.  No redness or infiltration noted at PIV site.  Tech offered to place new PIV, but pt refused at this time.  Pt is awaiting for update from hospitalist/cardiologist.

## 2017-04-13 NOTE — ED Notes (Signed)
2:57 PM  04/13/17     [facility] with verbalization of understanding. Patient accompanied by female.  Patient discharged with the following prescriptions of Tylenol, Mylanta, Robitussin and Protonix. Patient discharged to home (destination).      Jennifer Downs

## 2017-04-13 NOTE — ED Notes (Signed)
Cardiology NP is at the bedside for consult

## 2017-04-13 NOTE — Discharge Summary (Addendum)
Discharge Summary       Patient: Jennifer Downs Age: 57 y.o. DOB: 26-Jul-1960 MR#: [id number] SSN: [id number]  PCP on record: None  Admit date: 04/12/2017  Discharge date: 04/13/2017    Admission Diagnoses:   Chest pain [R07.9]  -    Discharge Diagnoses:      Chest pain  Tobacco dependence  Ascending aortic aneurysm 4.1 cm  Chronic back pain s/p stimulator        Medical History   Patient is a very pleasant 57yo F who comes in with chest pain. Patient has been having mid sternal chest pain and was placed in ER observation yesterday. She had a stress echo yesterday that was negative for ischemia. Patient was discharged home. She had recurrent chest pain today, radiating to left arm. No syncope. No SOB. No fever. No chills. She continues to smoke cigarettes.           Hospital Course by Problem   Chest pain  Tobacco dependence  Ascending aortic aneurysm 4.1 cm  Chronic back pain s/p stimulator  Cardiology consult  Atypical chest pain with negative stress test, troponin, non-ischemic EKG on my review.  Overall suspect non-cardiac chest pain.  Will try GI cocktail.  Anxiety may be playing a role as pain increased with emotional stress.  Also, can consider coronary CTA as outpatient for definitive evaluation of coronary anatomy to help guide primary prevention strategies.  Discussed smoking cessation .  Stable for discharge to home today.            Today's examination of the patient revealed:     Objective:   VS:   Visit Vitals  BP 106/70 (BP 1 Location: Left arm, BP Patient Position: At rest;Supine)   Pulse 67   Temp 98 ??F (36.7 ??C)   Resp 18   Ht '5\' 2"'  (1.575 m)   Wt 113.4 kg (250 lb)   SpO2 98%   BMI 45.73 kg/m??      Tmax/24hrs: Temp (24hrs), Avg:98.2 ??F (36.8 ??C), Min:98 ??F (36.7 ??C), Max:98.3 ??F (36.8 ??C)     Input/Output:     Intake/Output Summary (Last 24 hours) at 04/13/2017 1413  Last data filed at 04/13/2017 0350  Gross per 24 hour   Intake 1000 ml   Output ???   Net 1000 ml        General appearance: no distress  Eyes: sclera anicteric  ENT: no oral lesions, thyroid normal  Skin: no spider angiomata, jaundice,   Respiratory: clear to auscultation bilaterally  Cardiovascular: regular heart rate, no murmurs, no JVD  Abdomen: soft, non-tender, no rebound  Extremities: no muscle wasting, no gross arthritic changes  GU: not examined  Neurologic: alert and oriented, cranial nerves grossly intact,         Labs:    Recent Results (from the past 24 hour(s))   EKG, 12 LEAD, INITIAL    Collection Time: 04/12/17 11:30 PM   Result Value Ref Range    Ventricular Rate 91 BPM    Atrial Rate 91 BPM    P-R Interval 140 ms    QRS Duration 96 ms    Q-T Interval 392 ms    QTC Calculation (Bezet) 482 ms    Calculated P Axis 71 degrees    Calculated R Axis 28 degrees    Calculated T Axis 68 degrees    Diagnosis       Normal sinus rhythm  Prolonged QT  When compared with ECG of 12-Apr-2017  03:43,  No significant change was found  Confirmed by Marrion Coy, M.D., Ellin Saba. ([id number]) on 04/13/2017 7:00:54 AM     CBC WITH AUTOMATED DIFF    Collection Time: 04/12/17 11:45 PM   Result Value Ref Range    WBC 7.9 4.0 - 11.0 1000/mm3    RBC 4.63 3.60 - 5.20 M/uL    HGB 14.0 13.0 - 17.2 gm/dl    HCT 40.7 37.0 - 50.0 %    MCV 87.9 80.0 - 98.0 fL    MCH 30.2 25.4 - 34.6 pg    MCHC 34.4 30.0 - 36.0 gm/dl    PLATELET 206 140 - 450 1000/mm3    MPV 9.9 6.0 - 10.0 fL    RDW-SD 42.5 36.4 - 46.3      NRBC 0 0 - 0      IMMATURE GRANULOCYTES 0.3 0.0 - 3.0 %    NEUTROPHILS 51.2 34 - 64 %    LYMPHOCYTES 38.1 28 - 48 %    MONOCYTES 7.8 1 - 13 %    EOSINOPHILS 2.3 0 - 5 %    BASOPHILS 0.3 0 - 3 %   METABOLIC PANEL, COMPREHENSIVE    Collection Time: 04/12/17 11:45 PM   Result Value Ref Range    Sodium 140 136 - 145 mEq/L    Potassium 3.6 3.5 - 5.1 mEq/L    Chloride 110 (H) 98 - 107 mEq/L    CO2 21 21 - 32 mEq/L    Glucose 106 74 - 106 mg/dl    BUN 12 7 - 25 mg/dl    Creatinine 0.9 0.6 - 1.3 mg/dl    GFR est AA >60.0      GFR est non-AA >60       Calcium 8.9 8.5 - 10.1 mg/dl    AST (SGOT) 8 (L) 15 - 37 U/L    ALT (SGPT) 17 12 - 78 U/L    Alk. phosphatase 73 45 - 117 U/L    Bilirubin, total 0.4 0.2 - 1.0 mg/dl    Protein, total 7.0 6.4 - 8.2 gm/dl    Albumin 3.6 3.4 - 5.0 gm/dl    Anion gap 9 5 - 15 mmol/L   TROPONIN I    Collection Time: 04/12/17 11:45 PM   Result Value Ref Range    Troponin-I <0.015 0.000 - 0.045 ng/ml   NT-PRO BNP    Collection Time: 04/12/17 11:45 PM   Result Value Ref Range    NT pro-BNP 133.0 (H) 0.0 - 125.0 pg/ml   TROPONIN I    Collection Time: 04/13/17  4:00 AM   Result Value Ref Range    Troponin-I 0.016 0.000 - 0.045 ng/ml   TROPONIN I    Collection Time: 04/13/17  5:22 AM   Result Value Ref Range    Troponin-I <0.015 0.000 - 0.045 ng/ml     Additional Data Reviewed:      XR Results (most recent):  Results from Hospital Encounter encounter on 04/12/17   XR CHEST PA LAT    Narrative PA lateral chest    Comparison: None    Indication: Chest pain    Findings: Chest reveals normal Cardiac silhouette. Lungs are clear of any  failure, acute infiltrates, or other abnormality. Bony structures are normal.  Costophrenic angles are clear. TENS electrical stimulating device in place.      Impression Impression: Negative chest.         MRI Results (most recent):  No results found for  this or any previous visit.    CT Results (most recent):  Results from Hospital Encounter encounter on 04/12/17   CTA CHEST W OR W WO CONT    Narrative EXAMINATION: CTA CHEST W OR W WO CONT    CLINICAL INDICATION: Chest pain, acute, pulmonary embolism (PE) suspected    COMPARISON:  None.    TECHNIQUE: All CT exams at this facility use one or more dose reduction  techniques including automatic exposure control, ma/kV  adjustment per patient's  size, or iterative reconstruction technique. DICOM format image data available  to non-affiliated external healthcare facilities or entities on a secure, media   free, reciprocal searchable basis with patient authorization for 12 months  following the date of the study.    Multiple contiguous axial CT images were obtained at 92m increments from the  apex of the lungs to the lung bases with intravenous contrast. 3 D MIP images  reconstructed multiple projections.    FINDINGS:   Lungs/Soft tissue: Mild subpleural discoid atelectasis in the dependent upper  and lower lobes.     Vascular: Bovine arch, normal variant. Ascending aortic arch 4.1 cm. No  pulmonary artery embolism.    Mediastinum:  Small mediastinal lymph nodes. Small hilar lymph nodes.    Thyroid : 6 mm low-density right thyroid gland inferiorly. No thyromegaly.    Axilla: No adenopathy seen in axilla,      Bones: No significant bony lesions.    Miscellaneous: Thoracic epidural neurostimulator.    Upper abdomen:  Unremarkable.      Impression IMPRESSION:     1. No pulmonary artery embolism. Borderline aortic aneurysm.  2. 6 mm low-density right thyroid gland.  3. No consolidation.               Condition:   Stable    Diet:   Regular  Disposition:    '[x]' Home   '[]' Home with Home Health   '[]' SNF/NH   '[]' Rehab   '[]' Home with family   '[]' Alternate Facility:____________________      Discharge Medications:        My Medications      START taking these medications      Instructions Each Dose to Equal Morning Noon Evening Bedtime   acetaminophen 325 mg tablet  Commonly known as:  TYLENOL    Your last dose was:      Your next dose is:          Take 2 Tabs by mouth every six (6) hours as needed for Pain.   650 mg                 alum-mag hydroxide-simeth 200-200-20 mg/5 mL Susp  Commonly known as:  MYLANTA    Your last dose was:      Your next dose is:          Take 30 mL by mouth every four (4) hours as needed (GERD).   30 mL                 guaiFENesin 100 mg/5 mL liquid  Commonly known as:  ROBITUSSIN    Your last dose was:      Your next dose is:          Take 5 mL by mouth every four (4) hours as needed for Cough.   100 mg                  pantoprazole 40 mg tablet  Commonly known  as:  PROTONIX    Your last dose was:      Your next dose is:          Take 1 Tab by mouth daily.   40 mg                    STOP taking these medications    ibuprofen 200 mg tablet  Commonly known as:  MOTRIN              Where to Get Your Medications      These medications were sent to [facility], [place], [place] [address]    Phone:  [phone]   ?? acetaminophen 325 mg tablet  ?? alum-mag hydroxide-simeth 200-200-20 mg/5 mL Susp  ?? guaiFENesin 100 mg/5 mL liquid  ?? pantoprazole 40 mg tablet             Follow-up Appointments and instructions:   Your PCP: None, within 3-5 days  Follow-up Information     Follow up With Specialties Details Why Contact Info    None    None ([phone]) Patient stated that they have no PCP              Consults: Cardiology    Treatment Team: Treatment Team: Attending Provider: Otila Kluver, MD; Physician Assistant: Sanjuan Dame; Consulting Provider: Hester Mates. Linton Rump, DO; Hospitalist: Otila Kluver, MD; Primary Nurse: Lafe Garin; Unit Clerk: [name] D        Thank you Dr None for allowing [name] to participate in the care of [place]       >42 minutes spent coordinating this discharge (review instructions/follow-up, prescriptions)    Signed:    [facility]  Otila Kluver, MD  04/13/2017  2:13 PM

## 2017-08-25 ENCOUNTER — Emergency Department: Admit: 2017-08-25 | Payer: MEDICARE | Primary: Family Medicine

## 2017-08-25 ENCOUNTER — Inpatient Hospital Stay
Admit: 2017-08-25 | Discharge: 2017-08-26 | Disposition: A | Discharge status: Home / Self Care | Payer: MEDICARE | Attending: Emergency Medicine

## 2017-08-25 DIAGNOSIS — R42 Dizziness and giddiness: Secondary | ICD-10-CM

## 2017-08-25 LAB — COMPREHENSIVE METABOLIC PANEL
ALT: 26 U/L (ref 12–78)
AST: 24 U/L (ref 15–37)
Albumin: 3.7 gm/dl (ref 3.4–5.0)
Alkaline Phosphatase: 69 U/L (ref 45–117)
Anion Gap: 9 mmol/L (ref 5–15)
BUN: 4 mg/dl — ABNORMAL LOW (ref 7–25)
CO2: 23 mEq/L (ref 21–32)
Calcium: 9 mg/dl (ref 8.5–10.1)
Chloride: 104 mEq/L (ref 98–107)
Creatinine: 0.7 mg/dl (ref 0.6–1.3)
EGFR IF NonAfrican American: >60
GFR African American: >60
Glucose: 106 mg/dl (ref 74–106)
Potassium: 3.1 mEq/L — ABNORMAL LOW (ref 3.5–5.1)
Sodium: 136 mEq/L (ref 136–145)
Total Bilirubin: 0.9 mg/dl (ref 0.2–1.0)
Total Protein: 6.7 gm/dl (ref 6.4–8.2)

## 2017-08-25 LAB — CBC WITH AUTO DIFFERENTIAL
Basophils %: 0.1 % (ref 0–3)
Eosinophils %: 0.4 % (ref 0–5)
Hematocrit: 39 % (ref 37.0–50.0)
Hemoglobin: 13.3 gm/dl (ref 13.0–17.2)
Immature Granulocytes: 0.3 % (ref 0.0–3.0)
Lymphocytes %: 18.2 % — ABNORMAL LOW (ref 28–48)
MCH: 31.1 pg (ref 25.4–34.6)
MCHC: 34.1 gm/dl (ref 30.0–36.0)
MCV: 91.1 fL (ref 80.0–98.0)
MPV: 9.6 fL (ref 6.0–10.0)
Monocytes %: 6.7 % (ref 1–13)
Neutrophils %: 74.3 % — ABNORMAL HIGH (ref 34–64)
Nucleated RBCs: 0 (ref 0–0)
Platelets: 241 10*3/uL (ref 140–450)
RBC: 4.28 M/uL (ref 3.60–5.20)
RDW-SD: 44.3 (ref 36.4–46.3)
WBC: 11.3 10*3/uL — ABNORMAL HIGH (ref 4.0–11.0)

## 2017-08-25 LAB — ETHYL ALCOHOL
ALCOHOL(ETHYL),SERUM: <3 mg/dl (ref 0.0–9.0)
Ethyl Alcohol: <3 mg/dl (ref 0.0–9.0)

## 2017-08-25 LAB — MAGNESIUM
Magnesium: 2.2 mg/dl (ref 1.6–2.6)
Magnesium: 2.2 mg/dl (ref 1.6–2.6)

## 2017-08-25 LAB — METABOLIC PANEL, COMPREHENSIVE
ALT (SGPT): 26 U/L (ref 12–78)
AST (SGOT): 24 U/L (ref 15–37)
Albumin: 3.7 gm/dl (ref 3.4–5.0)
Alk. phosphatase: 69 U/L (ref 45–117)
Anion gap: 9 mmol/L (ref 5–15)
BUN: 4 mg/dl — ABNORMAL LOW (ref 7–25)
Bilirubin, total: 0.9 mg/dl (ref 0.2–1.0)
CO2: 23 mEq/L (ref 21–32)
Calcium: 9 mg/dl (ref 8.5–10.1)
Chloride: 104 mEq/L (ref 98–107)
Creatinine: 0.7 mg/dl (ref 0.6–1.3)
GFR est AA: >60
GFR est non-AA: >60
Glucose: 106 mg/dl (ref 74–106)
Potassium: 3.1 mEq/L — ABNORMAL LOW (ref 3.5–5.1)
Protein, total: 6.7 gm/dl (ref 6.4–8.2)
Sodium: 136 mEq/L (ref 136–145)

## 2017-08-25 LAB — CBC WITH AUTOMATED DIFF
BASOPHILS: 0.1 % (ref 0–3)
EOSINOPHILS: 0.4 % (ref 0–5)
HCT: 39 % (ref 37.0–50.0)
HGB: 13.3 gm/dl (ref 13.0–17.2)
IMMATURE GRANULOCYTES: 0.3 % (ref 0.0–3.0)
LYMPHOCYTES: 18.2 % — ABNORMAL LOW (ref 28–48)
MCH: 31.1 pg (ref 25.4–34.6)
MCHC: 34.1 gm/dl (ref 30.0–36.0)
MCV: 91.1 fL (ref 80.0–98.0)
MONOCYTES: 6.7 % (ref 1–13)
MPV: 9.6 fL (ref 6.0–10.0)
NEUTROPHILS: 74.3 % — ABNORMAL HIGH (ref 34–64)
NRBC: 0 (ref 0–0)
PLATELET: 241 10*3/uL (ref 140–450)
RBC: 4.28 M/uL (ref 3.60–5.20)
RDW-SD: 44.3 (ref 36.4–46.3)
WBC: 11.3 10*3/uL — ABNORMAL HIGH (ref 4.0–11.0)

## 2017-08-25 MED ORDER — LORAZEPAM 2 MG/ML IJ SOLN
2 mg/mL | INTRAMUSCULAR | Status: DC | PRN
Start: 2017-08-25 — End: 2017-08-26

## 2017-08-25 MED ORDER — POTASSIUM BICARBONATE-CITRIC ACID 25 MEQ EFFERVESCENT TAB
25 mEq | ORAL | Status: AC
Start: 2017-08-25 — End: 2017-08-25
  Administered 2017-08-26: 01:00:00 via ORAL

## 2017-08-25 MED ORDER — LORAZEPAM 1 MG TAB
1 mg | ORAL | Status: DC | PRN
Start: 2017-08-25 — End: 2017-08-26
  Administered 2017-08-26: 01:00:00 via ORAL

## 2017-08-25 MED ORDER — ONDANSETRON (PF) 4 MG/2 ML INJECTION
4 mg/2 mL | Freq: Once | INTRAMUSCULAR | Status: AC
Start: 2017-08-25 — End: 2017-08-25
  Administered 2017-08-26: 01:00:00 via INTRAVENOUS

## 2017-08-25 MED ORDER — LORAZEPAM 1 MG TAB
1 mg | ORAL | Status: DC | PRN
Start: 2017-08-25 — End: 2017-08-26

## 2017-08-25 MED ORDER — SODIUM CHLORIDE 0.9 % IJ SYRG
Freq: Once | INTRAMUSCULAR | Status: AC
Start: 2017-08-25 — End: 2017-08-25
  Administered 2017-08-25: 23:00:00 via INTRAVENOUS

## 2017-08-25 MED ORDER — SODIUM CHLORIDE 0.9% BOLUS IV
0.9 % | INTRAVENOUS | Status: AC
Start: 2017-08-25 — End: 2017-08-25
  Administered 2017-08-25: 23:00:00 via INTRAVENOUS

## 2017-08-25 NOTE — ED Notes (Signed)
Bedside and Verbal shift change report given to Darel HongJudy, Charity fundraiserN (oncoming nurse) by Delorise Jacksonori, RN (offgoing nurse). Report included the following information SBAR, ED Summary, MAR and Recent Results.

## 2017-08-25 NOTE — ED Notes (Signed)
Assumed care of patient. Report received from [place], [place]. Patient ambulates with steady gait to bathroom unassisted. Resps even and unlabored. NADN.

## 2017-08-25 NOTE — ED Provider Notes (Signed)
ED Provider Notes by Fredda Hammed, PA-C at 08/25/17 1941                Author: Fredda Hammed, PA-C  Service: Emergency Medicine  Author Type: Physician Assistant       Filed: 08/26/17 0550  Date of Service: 08/25/17 1941  Status: Attested Addendum          Editor: Bitzer, Bonnee Quin (Physician Assistant)       Related Notes: Original Note by Fredda Hammed, PA-C (Physician Assistant) filed at 08/26/17  [phone]          Cosigner: Lennart Pall, MD at 09/06/17 1139          Attestation signed by Lennart Pall, MD at 09/06/17 1139          I interviewed and examined the patient. I discussed with the mid-level provider agree with her evaluation and plan as documented here.      This is a patient who was just seen in [place].  She has an acute bronchitis.      She has tried to stop drinking alcohol cold [place].  I believe she is having some withdrawal which is led her to not be able to sleep over the last few nights.   She seems significantly better after a small dose of Ativan.   She has been observed for quite some time and has been ambulatory to the bathroom with unremarkable vital signs and other wise unremarkable work-up      We have provided a small amount of Ativan to help control withdrawal symptoms.  I have strongly encouraged her to enter outpatient detox and engage in [place] or other group therapies      We did offer her evaluation here for consideration of inpatient detox but she has declined                                 [facility]   Emergency Department Treatment Report                Patient: Jennifer Downs  Age: 57 y.o.  Sex: female          Date of Birth: [birth year]  Admit Date: 08/25/2017  PCP: Jeanella Flattery, MD     MRN: [id number]   CSN: [id number]   Attending: Lennart Pall, MD         Room: ER29/ER29  Time Dictated: 7:41 PM  APP: Fredda Hammed, PA-C        Chief Complaint      Chief Complaint       Patient presents with        ?   Cough        ?  Fatigue                  History of Present Illness     57 y.o. female  who presents for evaluation of dizziness, generalized weakness and cough.  Patient states she has been feeling dizzy and nauseous for the past few days which she states is due to not being able to sleep well. She is unsure why she is not sleeping well.   She states she was evaluated at her doctor this morning and was diagnosed with bronchitis and put on doxycycline.  She notes she has been having a dry cough for the past  4 days and endorses associated congestion and chills.  She denies known fevers.   She reports feeling short of breath like it is hard to take a deep breath which started this afternoon.  She denies any pain in her chest.  She notes feeling diffusely weak and states she feels numb all over. She denies abdominal pain, vomiting, melena,  headache. She has had diarrhea for the past few days. She states she has been drinking approximately 9 beers every day for the past several weeks and reports last drink 2 days ago stating she is trying to quit. She was recently admitted at [facility]  3 days, but is not able to tell me anything about this other than that she passed out that day. She denies any falls since then. She is a very poor historian but is oriented x3. She states she has been under a lot of stress lately due to her roommate  moving out. She denies SI/HI. She has no other complaints at this time.         Review of Systems     Review of Systems    Constitutional: Positive for chills. Negative for fever.    HENT: Positive for congestion. Negative for sore throat.     Eyes: Negative for blurred vision and double vision.    Respiratory: Positive for cough and shortness of breath . Negative for hemoptysis and sputum production.     Cardiovascular: Negative for chest pain and leg swelling.    Gastrointestinal: Positive for diarrhea and nausea . Negative for abdominal pain, blood in stool and vomiting.     Genitourinary: Negative for dysuria and frequency.    Musculoskeletal: Positive for back pain (chronic low back pain- has implanted  simulator ). Negative for neck pain.    Skin: Negative for rash.    Neurological: Positive for dizziness. Negative for loss of consciousness and headaches.    Psychiatric/Behavioral: Positive for substance abuse. Negative for suicidal ideas.            Past Medical/Surgical History          Past Medical History:        Diagnosis  Date         ?  Back pain       ?  CAD (coronary artery disease)       ?  Fibromyalgia       ?  Migraines       ?  Mitral valve disorder       ?  Seizures ([place])           ?  Stroke [facility])            Past Surgical History:         Procedure  Laterality  Date          ?  HX HYSTERECTOMY         ?  HX ORTHOPAEDIC              carpal tunnel          ?  NEUROLOGICAL PROCEDURE UNLISTED              placement of neurostimulator             Social History          Social History          Socioeconomic History         ?  Marital status:  LEGALLY SEPARATED  Spouse name:  Not on file         ?  Number of children:  Not on file     ?  Years of education:  Not on file     ?  Highest education level:  Not on file       Occupational History        ?  Not on file       Social Needs         ?  Financial resource strain:  Not on file        ?  Food insecurity:              Worry:  Not on file         Inability:  Not on file        ?  Transportation needs:              Medical:  Not on file         Non-medical:  Not on file       Tobacco Use         ?  Smoking status:  Current Every Day Smoker              Packs/day:  1.00         ?  Smokeless tobacco:  Never Used       Substance and Sexual Activity         ?  Alcohol use:  Yes             Comment: occasional         ?  Drug use:  Yes              Types:  Marijuana         ?  Sexual activity:  Not on file       Lifestyle        ?  Physical activity:              Days per week:  Not on file         Minutes per session:   Not on file         ?  Stress:  Not on file       Relationships        ?  Social connections:              Talks on phone:  Not on file         Gets together:  Not on file         Attends religious service:  Not on file         Active member of club or organization:  Not on file         Attends meetings of clubs or organizations:  Not on file         Relationship status:  Not on file        ?  Intimate partner violence:              Fear of current or ex partner:  Not on file         Emotionally abused:  Not on file         Physically abused:  Not on file         Forced sexual activity:  Not on file        Other Topics  Concern        ?  Not on file       Social History Narrative        ?  Not on file             Family History     History reviewed. No pertinent family history.        Current Medications        (Not in a hospital admission)        Allergies          Allergies        Allergen  Reactions         ?  Ciprofloxacin  Other (comments)             Muscle cramping                Physical Exam          ED Triage Vitals [08/25/17 1834]     ED Encounter Vitals Group           BP  130/85        Pulse (Heart Rate)  91        Resp Rate  16        Temp  98.7 ??F (37.1 ??C)        Temp src          O2 Sat (%)  95 %        Weight  201 lb           Height  '5\' 2"'            Constitutional: Patient appears well developed and well nourished. Appears nontoxic, but anxious.    HENT: Normal cephalic, atraumatic, and nontender.   Mucous membranes moist, non-erythematous and without lesions. Uvula is midline.    Eyes: Conjunctivae are clear with no discharge. PERRL. EOM intact.    Neck: Normal ROM and non tender. Supple. No nuchal rigidity.    Respiratory: Breath sounds are equal bilaterally. Lungs clear to auscultation with nonlabored respirations. No tachypnea or accessory muscle  use.    Cardiovascular: Normal rate and rhythm.  Distal pulses 2+ and equal bilaterally. Calves soft and non-tender.   Gastrointestinal: Bowel  sounds normal. Abdomen soft and without complaint of pain to palpation. No distention. No masses palpable.    Musculoskeletal: Moves all extremities well. No lower extremity edema.    Lymphatic: No cervical adenopathy.   Integumentary: Warm and dry without rashes or erythema.   Neurologic: Alert and oriented to person, place, and time. No cranial nerve deficits. No facial asymmetry or dysarthria. Normal strength. Sensation  intact and symmetric.  No pronator drift. Normal gait.    Psychological: Very anxious appearing.           Impression and Management Plan     This is a 57 y.o.  female with dizziness, generalized weakness, nausea. She recent quit drinking two days ago. She is very shakey and anxious appearing. I suspect patient is going through alcohol withdrawal. Will obtain  appropriate studies to evaluate the patient's complaints and initiate CIWA protocol.         Diagnostic Studies     Lab:      Recent Results (from the past 24 hour(s))     CBC WITH AUTOMATED DIFF          Collection Time: 08/25/17  6:48 PM         Result  Value  Ref Range  WBC  11.3 (H)  4.0 - 11.0 1000/mm3       RBC  4.28  3.60 - 5.20 M/uL       HGB  13.3  13.0 - 17.2 gm/dl       HCT  39.0  37.0 - 50.0 %       MCV  91.1  80.0 - 98.0 fL       MCH  31.1  25.4 - 34.6 pg       MCHC  34.1  30.0 - 36.0 gm/dl            PLATELET  241  140 - 450 1000/mm3            MPV  9.6  6.0 - 10.0 fL       RDW-SD  44.3  36.4 - 46.3         NRBC  0  0 - 0         IMMATURE GRANULOCYTES  0.3  0.0 - 3.0 %       NEUTROPHILS  74.3 (H)  34 - 64 %       LYMPHOCYTES  18.2 (L)  28 - 48 %       MONOCYTES  6.7  1 - 13 %       EOSINOPHILS  0.4  0 - 5 %       BASOPHILS  0.1  0 - 3 %       MAGNESIUM          Collection Time: 08/25/17  6:48 PM         Result  Value  Ref Range            Magnesium  2.2  1.6 - 2.6 mg/dl       METABOLIC PANEL, COMPREHENSIVE          Collection Time: 08/25/17  6:48 PM         Result  Value  Ref Range            Sodium  136  136 - 145  mEq/L       Potassium  3.1 (L)  3.5 - 5.1 mEq/L       Chloride  104  98 - 107 mEq/L       CO2  23  21 - 32 mEq/L       Glucose  106  74 - 106 mg/dl       BUN  4 (L)  7 - 25 mg/dl       Creatinine  0.7  0.6 - 1.3 mg/dl       GFR est AA  >60.0          GFR est non-AA  >60          Calcium  9.0  8.5 - 10.1 mg/dl       AST (SGOT)  24  15 - 37 U/L       ALT (SGPT)  26  12 - 78 U/L       Alk. phosphatase  69  45 - 117 U/L       Bilirubin, total  0.9  0.2 - 1.0 mg/dl       Protein, total  6.7  6.4 - 8.2 gm/dl       Albumin  3.7  3.4 - 5.0 gm/dl       Anion gap  9  5 - 15 mmol/L       ETHYL  ALCOHOL          Collection Time: 08/25/17  6:48 PM         Result  Value  Ref Range            ALCOHOL(ETHYL),SERUM  <3.0  0.0 - 9.0 mg/dl       LIPASE          Collection Time: 08/25/17  6:48 PM         Result  Value  Ref Range            Lipase  180  73 - 393 U/L       DRUG SCREEN, URINE          Collection Time: 08/25/17  8:20 PM         Result  Value  Ref Range            Amphetamine  NEGATIVE  NEGATIVE         Barbiturates  NEGATIVE  NEGATIVE         Benzodiazepines  NEGATIVE  NEGATIVE         Cocaine  NEGATIVE  NEGATIVE         Marijuana  NEGATIVE  NEGATIVE         Methadone  NEGATIVE  NEGATIVE         Opiates  NEGATIVE  NEGATIVE         Phencyclidine  NEGATIVE  NEGATIVE         POC URINE MACROSCOPIC          Collection Time: 08/25/17  8:22 PM         Result  Value  Ref Range            Glucose  Negative  NEGATIVE,Negative mg/dl       Bilirubin  Negative  NEGATIVE,Negative         Ketone  Negative  NEGATIVE,Negative mg/dl       Specific gravity  1.015  1.005 - 1.030         Blood  Negative  NEGATIVE,Negative         pH (UA)  7.0  5 - 9         Protein  Negative  NEGATIVE,Negative mg/dl       Urobilinogen  0.2  0.0 - 1.0 EU/dl       Nitrites  Negative  NEGATIVE,Negative         Leukocyte Esterase  Negative  NEGATIVE,Negative         Color  Yellow               Appearance  Clear             Labs Reviewed       CBC WITH AUTOMATED  DIFF - Abnormal; Notable for the following components:            Result  Value            WBC  11.3 (*)         NEUTROPHILS  74.3 (*)         LYMPHOCYTES  18.2 (*)            All other components within normal limits       METABOLIC PANEL, COMPREHENSIVE - Abnormal; Notable for the following components:            Potassium  3.1 (*)         BUN  4 (*)  All other components within normal limits       MAGNESIUM     ETHYL ALCOHOL     DRUG SCREEN, URINE     LIPASE       POC URINE MACROSCOPIC           Imaging:     Xr Chest Sngl V      Result Date: 08/25/2017   Indication: cough.    .       IMPRESSION: Portable AP upright view the chest exposed at 7:34 PM August 25, 2017 reveals the lungs are clear and the heart is of normal size..                 EKG: NSR at 86 bpm. PR int. 142 ms, QTc int. 466 ms. No acute ST or T-wave abnormalities that are consistent with acute ischemia or infarction per ED attending.         ED Course/Medical Decision Making     Labs today show hypokalemia. The remainder of her labs are unremarkable. EKG and CXR show no acute findings.    Patient was started on CIWA protocol. On initial reexamination, patient was sleeping. She noted feeling slightly improved after initial dose of ativan and IV fluids.  On second reassessment, patient was again sleeping, but easily arousable. She notes  feeling much improved and feels comfortable going home at this time. She was offered inpatient treatment, however declined at this time. I spoke with crisis clinician, Beth, who provided me with resources for her should she change her mind. She has ambulated  to the bathroom unassisted several times with normal steady gait. She was given prescription for ativan to use as needed for withdrawal symptoms.       Patient has not developed any new or worsening symptoms and has remained clinically stable throughout her course in the ED. She is awake and alert. I feel the patient is stable for discharge at this time to  continue outpatient management and follow up  with primary care. Patient was instructed to return to the ER if condition worsens or new symptoms develop. The patient indicates understanding of these issues and feels comfortable with the plan.            Medications       sodium chloride (NS) flush 5-10 mL (10 mL IntraVENous Given 08/25/17 1849)     sodium chloride 0.9 % bolus infusion 1,000 mL (0 mL IntraVENous IV Completed 08/25/17 2017)     potassium bicarbonate (KLYTE) tablet 50 mEq (50 mEq Oral Given 08/25/17 2031)       ondansetron (ZOFRAN) injection 4 mg (4 mg IntraVENous Given 08/25/17 2032)             Final Diagnosis                 ICD-10-CM  ICD-9-CM          1.  Lightheadedness  R42  780.4     2.  Nausea without vomiting  R11.0  787.02     3.  Alcohol withdrawal syndrome without complication (HCC)  W54.627  291.81          4.  Hypokalemia  E87.6  276.8             Disposition     Discharged in stable condition.        Discharge Medication List as of 08/25/2017 11:00 PM  START taking these medications          Details        LORazepam (ATIVAN) 1 mg tablet  Take 1 Tab by mouth three (3) times daily as needed for Anxiety for up to 3 days. Max Daily Amount: 3 mg., Print, Disp-9 Tab, R-0                     CONTINUE these medications which have NOT CHANGED          Details        DULoxetine (CYMBALTA) 20 mg capsule  Take 20 mg by mouth daily., Historical Med               atorvastatin (LIPITOR) 20 mg tablet  Take 20 mg by mouth daily., Historical Med               doxycycline (MONODOX) 100 mg capsule  Take 100 mg by mouth two (2) times a day., Historical Med               hydroCHLOROthiazide (MICROZIDE) 12.5 mg capsule  Take 12.5 mg by mouth daily., Historical Med               acetaminophen (TYLENOL) 325 mg tablet  Take 2 Tabs by mouth every six (6) hours as needed for Pain., Normal, Disp-20 Tab, R-0               alum-mag hydroxide-simeth (MYLANTA) 200-200-20 mg/5 mL susp  Take 30 mL by mouth every four  (4) hours as needed (GERD)., Normal, Disp-1 Bottle, R-0               guaiFENesin (ROBITUSSIN) 100 mg/5 mL liquid  Take 5 mL by mouth every four (4) hours as needed for Cough., Normal, Disp-1 Bottle, R-0               pantoprazole (PROTONIX) 40 mg tablet  Take 1 Tab by mouth daily., Normal, Disp-30 Tab, R-0                         Patient has been evaluated by myself and Dennison Bulla, BEN A, MD who agrees with the above assessment and plan.      Fredda Hammed, PA-C   August 26, 2017   7:41 PM         My signature above authenticates this document and my orders, the final diagnosis (es), discharge prescription (s), and instructions in the Epic record.   If you have any questions please contact [phone].       Nursing notes have been reviewed by the physician/ advanced practice     Clinician.

## 2017-08-25 NOTE — ED Notes (Signed)
Pt comes to ED c/o being dizzy and nauseated. Pt states she hasn't been sleeping well and not sure why. Pt dx with bronchitis today, prescribed doxy. Has not started taking medication. Pt states she has been very stressed out due to her roommate leaving her. States she has been drinking the last several days. Denies ETOH today. Pt is slow to respond to questions and states legs feel weak. PA at bedside

## 2017-08-25 NOTE — ED Notes (Signed)
Patient ambulated with steady gait to bathroom unassisted.

## 2017-08-25 NOTE — ED Notes (Signed)
Bedside and Verbal shift change report given to Judy, RN (oncoming nurse) by Tori, RN (offgoing nurse). Report included the following information SBAR, ED Summary, MAR and Recent Results.

## 2017-08-25 NOTE — ED Provider Notes (Addendum)
[place]  Emergency Department Treatment Report        Patient: Jennifer Downs Age: 57 y.o. Sex: female    Date of Birth: Sep 22, 1960 Admit Date: 08/25/2017 PCP: Jeanella Flattery, MD   MRN: [id number]  CSN: [id number]  Attending: Lennart Pall, MD   Room: ER29/ER29 Time Dictated: 7:41 PM APP: Fredda Hammed, PA-C     Chief Complaint   Chief Complaint   Patient presents with   ??? Cough   ??? Fatigue            History of Present Illness   57 y.o. female who presents for evaluation of dizziness, generalized weakness and cough.  Patient states she has been feeling dizzy and nauseous for the past few days which she states is due to not being able to sleep well. She is unsure why she is not sleeping well.  She states she was evaluated at her doctor this morning and was diagnosed with bronchitis and put on doxycycline.  She notes she has been having a dry cough for the past 4 days and endorses associated congestion and chills.  She denies known fevers.  She reports feeling short of breath like it is hard to take a deep breath which started this afternoon.  She denies any pain in her chest.  She notes feeling diffusely weak and states she feels numb all over. She denies abdominal pain, vomiting, melena, headache. She has had diarrhea for the past few days. She states she has been drinking approximately 9 beers every day for the past several weeks and reports last drink 2 days ago stating she is trying to quit. She was recently admitted at [facility] 3 days, but is not able to tell me anything about this other than that she passed out that day. She denies any falls since then. She is a very poor historian but is oriented x3. She states she has been under a lot of stress lately due to her roommate moving out. She denies SI/HI. She has no other complaints at this time.     Review of Systems   Review of Systems   Constitutional: Positive for chills. Negative for fever.    HENT: Positive for congestion. Negative for sore throat.    Eyes: Negative for blurred vision and double vision.   Respiratory: Positive for cough and shortness of breath. Negative for hemoptysis and sputum production.    Cardiovascular: Negative for chest pain and leg swelling.   Gastrointestinal: Positive for diarrhea and nausea. Negative for abdominal pain, blood in stool and vomiting.   Genitourinary: Negative for dysuria and frequency.   Musculoskeletal: Positive for back pain (chronic low back pain- has implanted simulator ). Negative for neck pain.   Skin: Negative for rash.   Neurological: Positive for dizziness. Negative for loss of consciousness and headaches.   Psychiatric/Behavioral: Positive for substance abuse. Negative for suicidal ideas.       Past Medical/Surgical History     Past Medical History:   Diagnosis Date   ??? Back pain    ??? CAD (coronary artery disease)    ??? Fibromyalgia    ??? Migraines    ??? Mitral valve disorder    ??? Seizures ([place])    ??? Stroke [facility])      Past Surgical History:   Procedure Laterality Date   ??? HX HYSTERECTOMY     ??? HX ORTHOPAEDIC      carpal tunnel   ??? NEUROLOGICAL  PROCEDURE UNLISTED      placement of neurostimulator       Social History     Social History     Socioeconomic History   ??? Marital status: LEGALLY SEPARATED     Spouse name: Not on file   ??? Number of children: Not on file   ??? Years of education: Not on file   ??? Highest education level: Not on file   Occupational History   ??? Not on file   Social Needs   ??? Financial resource strain: Not on file   ??? Food insecurity:     Worry: Not on file     Inability: Not on file   ??? Transportation needs:     Medical: Not on file     Non-medical: Not on file   Tobacco Use   ??? Smoking status: Current Every Day Smoker     Packs/day: 1.00   ??? Smokeless tobacco: Never Used   Substance and Sexual Activity   ??? Alcohol use: Yes     Comment: occasional   ??? Drug use: Yes     Types: Marijuana   ??? Sexual activity: Not on file   Lifestyle    ??? Physical activity:     Days per week: Not on file     Minutes per session: Not on file   ??? Stress: Not on file   Relationships   ??? Social connections:     Talks on phone: Not on file     Gets together: Not on file     Attends religious service: Not on file     Active member of club or organization: Not on file     Attends meetings of clubs or organizations: Not on file     Relationship status: Not on file   ??? Intimate partner violence:     Fear of current or ex partner: Not on file     Emotionally abused: Not on file     Physically abused: Not on file     Forced sexual activity: Not on file   Other Topics Concern   ??? Not on file   Social History Narrative   ??? Not on file       Family History   History reviewed. No pertinent family history.    Current Medications     (Not in a hospital admission)    Allergies     Allergies   Allergen Reactions   ??? Ciprofloxacin Other (comments)     Muscle cramping         Physical Exam     ED Triage Vitals [08/25/17 1834]   ED Encounter Vitals Group      BP 130/85      Pulse (Heart Rate) 91      Resp Rate 16      Temp 98.7 ??F (37.1 ??C)      Temp src       O2 Sat (%) 95 %      Weight 201 lb      Height '5\' 2"'        Constitutional: Patient appears well developed and well nourished. Appears nontoxic, but anxious.   HENT: Normal cephalic, atraumatic, and nontender.  Mucous membranes moist, non-erythematous and without lesions. Uvula is midline.   Eyes: Conjunctivae are clear with no discharge. PERRL. EOM intact.   Neck: Normal ROM and non tender. Supple. No nuchal rigidity.   Respiratory: Breath sounds are equal bilaterally. Lungs clear to auscultation  with nonlabored respirations. No tachypnea or accessory muscle use.   Cardiovascular: Normal rate and rhythm.  Distal pulses 2+ and equal bilaterally. Calves soft and non-tender.  Gastrointestinal: Bowel sounds normal. Abdomen soft and without complaint of pain to palpation. No distention. No masses palpable.    Musculoskeletal: Moves all extremities well. No lower extremity edema.   Lymphatic: No cervical adenopathy.  Integumentary: Warm and dry without rashes or erythema.  Neurologic: Alert and oriented to person, place, and time. No cranial nerve deficits. No facial asymmetry or dysarthria. Normal strength. Sensation intact and symmetric.  No pronator drift. Normal gait.   Psychological: Very anxious appearing.       Impression and Management Plan   This is a 57 y.o. female with dizziness, generalized weakness, nausea. She recent quit drinking two days ago. She is very shakey and anxious appearing. I suspect patient is going through alcohol withdrawal. Will obtain appropriate studies to evaluate the patient's complaints and initiate CIWA protocol.     Diagnostic Studies   Lab:   Recent Results (from the past 24 hour(s))   CBC WITH AUTOMATED DIFF    Collection Time: 08/25/17  6:48 PM   Result Value Ref Range    WBC 11.3 (H) 4.0 - 11.0 1000/mm3    RBC 4.28 3.60 - 5.20 M/uL    HGB 13.3 13.0 - 17.2 gm/dl    HCT 39.0 37.0 - 50.0 %    MCV 91.1 80.0 - 98.0 fL    MCH 31.1 25.4 - 34.6 pg    MCHC 34.1 30.0 - 36.0 gm/dl    PLATELET 241 140 - 450 1000/mm3    MPV 9.6 6.0 - 10.0 fL    RDW-SD 44.3 36.4 - 46.3      NRBC 0 0 - 0      IMMATURE GRANULOCYTES 0.3 0.0 - 3.0 %    NEUTROPHILS 74.3 (H) 34 - 64 %    LYMPHOCYTES 18.2 (L) 28 - 48 %    MONOCYTES 6.7 1 - 13 %    EOSINOPHILS 0.4 0 - 5 %    BASOPHILS 0.1 0 - 3 %   MAGNESIUM    Collection Time: 08/25/17  6:48 PM   Result Value Ref Range    Magnesium 2.2 1.6 - 2.6 mg/dl   METABOLIC PANEL, COMPREHENSIVE    Collection Time: 08/25/17  6:48 PM   Result Value Ref Range    Sodium 136 136 - 145 mEq/L    Potassium 3.1 (L) 3.5 - 5.1 mEq/L    Chloride 104 98 - 107 mEq/L    CO2 23 21 - 32 mEq/L    Glucose 106 74 - 106 mg/dl    BUN 4 (L) 7 - 25 mg/dl    Creatinine 0.7 0.6 - 1.3 mg/dl    GFR est AA >60.0      GFR est non-AA >60      Calcium 9.0 8.5 - 10.1 mg/dl    AST (SGOT) 24 15 - 37 U/L     ALT (SGPT) 26 12 - 78 U/L    Alk. phosphatase 69 45 - 117 U/L    Bilirubin, total 0.9 0.2 - 1.0 mg/dl    Protein, total 6.7 6.4 - 8.2 gm/dl    Albumin 3.7 3.4 - 5.0 gm/dl    Anion gap 9 5 - 15 mmol/L   ETHYL ALCOHOL    Collection Time: 08/25/17  6:48 PM   Result Value Ref Range    ALCOHOL(ETHYL),SERUM <3.0 0.0 -  9.0 mg/dl   LIPASE    Collection Time: 08/25/17  6:48 PM   Result Value Ref Range    Lipase 180 73 - 393 U/L   DRUG SCREEN, URINE    Collection Time: 08/25/17  8:20 PM   Result Value Ref Range    Amphetamine NEGATIVE NEGATIVE      Barbiturates NEGATIVE NEGATIVE      Benzodiazepines NEGATIVE NEGATIVE      Cocaine NEGATIVE NEGATIVE      Marijuana NEGATIVE NEGATIVE      Methadone NEGATIVE NEGATIVE      Opiates NEGATIVE NEGATIVE      Phencyclidine NEGATIVE NEGATIVE     POC URINE MACROSCOPIC    Collection Time: 08/25/17  8:22 PM   Result Value Ref Range    Glucose Negative NEGATIVE,Negative mg/dl    Bilirubin Negative NEGATIVE,Negative      Ketone Negative NEGATIVE,Negative mg/dl    Specific gravity 1.015 1.005 - 1.030      Blood Negative NEGATIVE,Negative      pH (UA) 7.0 5 - 9      Protein Negative NEGATIVE,Negative mg/dl    Urobilinogen 0.2 0.0 - 1.0 EU/dl    Nitrites Negative NEGATIVE,Negative      Leukocyte Esterase Negative NEGATIVE,Negative      Color Yellow      Appearance Clear       Labs Reviewed   CBC WITH AUTOMATED DIFF - Abnormal; Notable for the following components:       Result Value    WBC 11.3 (*)     NEUTROPHILS 74.3 (*)     LYMPHOCYTES 18.2 (*)     All other components within normal limits   METABOLIC PANEL, COMPREHENSIVE - Abnormal; Notable for the following components:    Potassium 3.1 (*)     BUN 4 (*)     All other components within normal limits   MAGNESIUM   ETHYL ALCOHOL   DRUG SCREEN, URINE   LIPASE   POC URINE MACROSCOPIC       Imaging:    Xr Chest Sngl V    Result Date: 08/25/2017  Indication: cough.    .     IMPRESSION: Portable AP upright view the chest exposed at 7:34 PM August 25, 2017 reveals the lungs are clear and the heart is of normal size..             EKG: NSR at 86 bpm. PR int. 142 ms, QTc int. 466 ms. No acute ST or T-wave abnormalities that are consistent with acute ischemia or infarction per ED attending.     ED Course/Medical Decision Making   Labs today show hypokalemia. The remainder of her labs are unremarkable. EKG and CXR show no acute findings.   Patient was started on CIWA protocol. On initial reexamination, patient was sleeping. She noted feeling slightly improved after initial dose of ativan and IV fluids.  On second reassessment, patient was again sleeping, but easily arousable. She notes feeling much improved and feels comfortable going home at this time. She was offered inpatient treatment, however declined at this time. I spoke with crisis clinician, Beth, who provided me with resources for her should she change her mind. She has ambulated to the bathroom unassisted several times with normal steady gait. She was given prescription for ativan to use as needed for withdrawal symptoms.     Patient has not developed any new or worsening symptoms and has remained clinically stable throughout her course in the  ED. She is awake and alert. I feel the patient is stable for discharge at this time to continue outpatient management and follow up with primary care. Patient was instructed to return to the ER if condition worsens or new symptoms develop. The patient indicates understanding of these issues and feels comfortable with the plan.       Medications   sodium chloride (NS) flush 5-10 mL (10 mL IntraVENous Given 08/25/17 1849)   sodium chloride 0.9 % bolus infusion 1,000 mL (0 mL IntraVENous IV Completed 08/25/17 2017)   potassium bicarbonate (KLYTE) tablet 50 mEq (50 mEq Oral Given 08/25/17 2031)   ondansetron (ZOFRAN) injection 4 mg (4 mg IntraVENous Given 08/25/17 2032)       Final Diagnosis       ICD-10-CM ICD-9-CM   1. Lightheadedness R42 780.4    2. Nausea without vomiting R11.0 787.02   3. Alcohol withdrawal syndrome without complication (HCC) P29.518 291.81   4. Hypokalemia E87.6 276.8       Disposition   Discharged in stable condition.    Discharge Medication List as of 08/25/2017 11:00 PM      START taking these medications    Details   LORazepam (ATIVAN) 1 mg tablet Take 1 Tab by mouth three (3) times daily as needed for Anxiety for up to 3 days. Max Daily Amount: 3 mg., Print, Disp-9 Tab, R-0         CONTINUE these medications which have NOT CHANGED    Details   DULoxetine (CYMBALTA) 20 mg capsule Take 20 mg by mouth daily., Historical Med      atorvastatin (LIPITOR) 20 mg tablet Take 20 mg by mouth daily., Historical Med      doxycycline (MONODOX) 100 mg capsule Take 100 mg by mouth two (2) times a day., Historical Med      hydroCHLOROthiazide (MICROZIDE) 12.5 mg capsule Take 12.5 mg by mouth daily., Historical Med      acetaminophen (TYLENOL) 325 mg tablet Take 2 Tabs by mouth every six (6) hours as needed for Pain., Normal, Disp-20 Tab, R-0      alum-mag hydroxide-simeth (MYLANTA) 200-200-20 mg/5 mL susp Take 30 mL by mouth every four (4) hours as needed (GERD)., Normal, Disp-1 Bottle, R-0      guaiFENesin (ROBITUSSIN) 100 mg/5 mL liquid Take 5 mL by mouth every four (4) hours as needed for Cough., Normal, Disp-1 Bottle, R-0      pantoprazole (PROTONIX) 40 mg tablet Take 1 Tab by mouth daily., Normal, Disp-30 Tab, R-0             Patient has been evaluated by myself and Dennison Bulla, BEN A, MD who agrees with the above assessment and plan.    Fredda Hammed, PA-C  August 26, 2017  7:41 PM      My signature above authenticates this document and my orders, the final diagnosis (es), discharge prescription (s), and instructions in the Epic record.  If you have any questions please contact [phone].     Nursing notes have been reviewed by the physician/ advanced practice    Clinician.

## 2017-08-25 NOTE — ED Triage Notes (Signed)
Pt comes to ED c/o being dizzy and nauseated. Pt states she hasn't been sleeping well and not sure why. Pt dx with bronchitis today, prescribed doxy. Has not started taking medication. Pt states she has been very stressed out due to her roommate leaving her. States she has been drinking the last several days. Denies ETOH today. Pt is slow to respond to questions and states legs feel weak. PA at bedside

## 2017-08-25 NOTE — ED Notes (Signed)
Assumed care of patient. Report received from Tori, RN. Patient ambulates with steady gait to bathroom unassisted. Resps even and unlabored. NADN.

## 2017-08-26 LAB — POC URINE MACROSCOPIC
Bilirubin, Urine: NEGATIVE
Bilirubin: NEGATIVE
Blood, Urine: NEGATIVE
Blood: NEGATIVE
Glucose, Ur: NEGATIVE mg/dl
Glucose: NEGATIVE mg/dl
Ketone: NEGATIVE mg/dl
Ketones, Urine: NEGATIVE mg/dl
Leukocyte Esterase, Urine: NEGATIVE
Leukocyte Esterase: NEGATIVE
Nitrite, Urine: NEGATIVE
Nitrites: NEGATIVE
Protein, UA: NEGATIVE mg/dl
Protein: NEGATIVE mg/dl
Specific Gravity, UA: 1.015 (ref 1.005–1.030)
Specific gravity: 1.015 (ref 1.005–1.030)
Urobilinogen, UA, POCT: 0.2 EU/dl (ref 0.0–1.0)
Urobilinogen: 0.2 EU/dl (ref 0.0–1.0)
pH (UA): 7 (ref 5–9)
pH, UA: 7 (ref 5–9)

## 2017-08-26 LAB — EKG 12-LEAD
Atrial Rate: 86 {beats}/min
Diagnosis: NORMAL
P Axis: 72 degrees
P-R Interval: 142 ms
Q-T Interval: 390 ms
QRS Duration: 96 ms
QTc Calculation (Bazett): 466 ms
R Axis: 21 degrees
T Axis: 72 degrees
Ventricular Rate: 86 {beats}/min

## 2017-08-26 LAB — DRUG SCREEN, URINE
Amphetamine: NEGATIVE
Amphetamines: NEGATIVE
Barbiturates: NEGATIVE
Barbiturates: NEGATIVE
Benzodiazapines: NEGATIVE
Benzodiazepines: NEGATIVE
Cocaine: NEGATIVE
Cocaine: NEGATIVE
Marijuana: NEGATIVE
Marijuana: NEGATIVE
Methadone: NEGATIVE
Methadone: NEGATIVE
Opiates: NEGATIVE
Opiates: NEGATIVE
Phencyclidine: NEGATIVE
Phencyclidine: NEGATIVE

## 2017-08-26 LAB — LIPASE
Lipase: 180 U/L (ref 73–393)
Lipase: 180 U/L (ref 73–393)

## 2017-08-26 LAB — EKG, 12 LEAD, INITIAL
Atrial Rate: 86 {beats}/min
Calculated P Axis: 72 degrees
Calculated R Axis: 21 degrees
Calculated T Axis: 72 degrees
Diagnosis: NORMAL
P-R Interval: 142 ms
Q-T Interval: 390 ms
QRS Duration: 96 ms
QTC Calculation (Bezet): 466 ms
Ventricular Rate: 86 {beats}/min

## 2017-08-26 MED ORDER — LORAZEPAM 1 MG TAB
1 mg | ORAL_TABLET | Freq: Three times a day (TID) | ORAL | 0 refills | Status: AC | PRN
Start: 2017-08-26 — End: 2017-08-28

## 2017-08-26 MED FILL — ONDANSETRON (PF) 4 MG/2 ML INJECTION: 4 mg/2 mL | INTRAMUSCULAR | Qty: 2

## 2017-08-26 MED FILL — POTASSIUM BICARBONATE-CITRIC ACID 25 MEQ EFFERVESCENT TAB: 25 mEq | ORAL | Qty: 2

## 2017-08-26 MED FILL — LORAZEPAM 1 MG TAB: 1 mg | ORAL | Qty: 1

## 2017-08-26 NOTE — ED Notes (Signed)
1:53 AM  08/26/17     Discharge instructions given to Jennifer PlaterKathy Hernon (name) with verbalization of understanding. Patient accompanied by self.  Patient discharged with the following prescriptions see chart. Patient discharged to home (destination). Patient is stable at this time.     Bess HarvestJudy Douglas

## 2017-08-26 NOTE — ED Notes (Signed)
1:53 AM  08/26/17     Discharge instructions given to Jennifer Downs (name) with verbalization of understanding. Patient accompanied by self.  Patient discharged with the following prescriptions see chart. Patient discharged to home (destination). Patient is stable at this time.     Judy Douglas

## 2017-09-28 ENCOUNTER — Emergency Department (HOSPITAL_COMMUNITY)
Admission: EM | Admit: 2017-09-28 | Discharge: 2017-09-30 | Disposition: A | Discharge status: Other Acute Inpatient Hospital | Payer: Medicare Other | Attending: Emergency Medicine | Admitting: Emergency Medicine

## 2017-09-28 ENCOUNTER — Encounter (HOSPITAL_COMMUNITY): Payer: Self-pay | Admitting: Emergency Medicine

## 2017-09-28 ENCOUNTER — Other Ambulatory Visit: Payer: Self-pay

## 2017-09-28 DIAGNOSIS — F315 Bipolar disorder, current episode depressed, severe, with psychotic features: Secondary | ICD-10-CM | POA: Insufficient documentation

## 2017-09-28 DIAGNOSIS — E119 Type 2 diabetes mellitus without complications: Secondary | ICD-10-CM | POA: Insufficient documentation

## 2017-09-28 DIAGNOSIS — Z79899 Other long term (current) drug therapy: Secondary | ICD-10-CM | POA: Insufficient documentation

## 2017-09-28 DIAGNOSIS — F319 Bipolar disorder, unspecified: Secondary | ICD-10-CM | POA: Diagnosis present

## 2017-09-28 DIAGNOSIS — F17210 Nicotine dependence, cigarettes, uncomplicated: Secondary | ICD-10-CM | POA: Insufficient documentation

## 2017-09-28 DIAGNOSIS — F3010 Manic episode without psychotic symptoms, unspecified: Secondary | ICD-10-CM

## 2017-09-28 LAB — CBC WITH DIFFERENTIAL/PLATELET
BASOS ABS: 0 10*3/uL (ref 0.0–0.1)
Basophils Relative: 0 %
EOS PCT: 2 %
Eosinophils Absolute: 0.1 10*3/uL (ref 0.0–0.7)
HCT: 38.7 % (ref 36.0–46.0)
Hemoglobin: 13.5 g/dL (ref 12.0–15.0)
LYMPHS PCT: 43 %
Lymphs Abs: 2.9 10*3/uL (ref 0.7–4.0)
MCH: 30.4 pg (ref 26.0–34.0)
MCHC: 34.9 g/dL (ref 30.0–36.0)
MCV: 87.2 fL (ref 78.0–100.0)
MONO ABS: 0.6 10*3/uL (ref 0.1–1.0)
Monocytes Relative: 9 %
Neutro Abs: 3.2 10*3/uL (ref 1.7–7.7)
Neutrophils Relative %: 46 %
PLATELETS: 262 10*3/uL (ref 150–400)
RBC: 4.44 MIL/uL (ref 3.87–5.11)
RDW: 13.1 % (ref 11.5–15.5)
WBC: 6.8 10*3/uL (ref 4.0–10.5)

## 2017-09-28 LAB — ETHANOL

## 2017-09-28 LAB — BASIC METABOLIC PANEL
ANION GAP: 14 (ref 5–15)
BUN: 8 mg/dL (ref 6–20)
CALCIUM: 9.2 mg/dL (ref 8.9–10.3)
CO2: 23 mmol/L (ref 22–32)
CREATININE: 0.74 mg/dL (ref 0.44–1.00)
Chloride: 104 mmol/L (ref 98–111)
GFR calc Af Amer: >60 mL/min (ref >60)
GLUCOSE: 98 mg/dL (ref 70–99)
Potassium: 2.3 mmol/L — CL (ref 3.5–5.1)
Sodium: 141 mmol/L (ref 135–145)

## 2017-09-28 LAB — SALICYLATE LEVEL: Salicylate Lvl: <7 mg/dL (ref 2.8–30.0)

## 2017-09-28 LAB — ACETAMINOPHEN LEVEL: Acetaminophen (Tylenol), Serum: <10 ug/mL — ABNORMAL LOW (ref 10–30)

## 2017-09-28 MED ORDER — NICOTINE 7 MG/24HR TD PT24
7.0000 mg | MEDICATED_PATCH | Freq: Once | TRANSDERMAL | Status: AC
  Administered 2017-09-29: 7 mg via TRANSDERMAL
  Filled 2017-09-28: qty 1

## 2017-09-28 MED ORDER — POTASSIUM CHLORIDE 10 MEQ/100ML IV SOLN
10.0000 meq | Freq: Once | INTRAVENOUS | Status: AC
  Administered 2017-09-28: 10 meq via INTRAVENOUS
  Filled 2017-09-28: qty 100

## 2017-09-28 NOTE — ED Notes (Signed)
Bed: MG86 Expected date:  Expected time:  Means of arrival:  Comments: EMS 57 yo female from home manic episode
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
## 2017-09-28 NOTE — ED Triage Notes (Signed)
Pt arriving via [facility] for manic behavior. Hx of bipolar disorder. Unable to confirm if pt has been taking her medications. Pt combative upon arrival. Pt given 5mg  Midazolam IM prior to arrival.
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
## 2017-09-28 NOTE — ED Notes (Signed)
Pt changed out into burgundy scrubs and clothes were placed in locker 28. Pt refusing to hand over her phone and car keys.

## 2017-09-29 LAB — BASIC METABOLIC PANEL
ANION GAP: 10 (ref 5–15)
BUN: 11 mg/dL (ref 6–20)
CO2: 27 mmol/L (ref 22–32)
Calcium: 9 mg/dL (ref 8.9–10.3)
Chloride: 105 mmol/L (ref 98–111)
Creatinine, Ser: 0.78 mg/dL (ref 0.44–1.00)
GFR calc Af Amer: >60 mL/min (ref >60)
Glucose, Bld: 106 mg/dL — ABNORMAL HIGH (ref 70–99)
POTASSIUM: 2.5 mmol/L — AB (ref 3.5–5.1)
SODIUM: 142 mmol/L (ref 135–145)

## 2017-09-29 LAB — RAPID URINE DRUG SCREEN, HOSP PERFORMED
Amphetamines: NOT DETECTED
BARBITURATES: NOT DETECTED
BENZODIAZEPINES: POSITIVE — AB
COCAINE: NOT DETECTED
Opiates: NOT DETECTED
Tetrahydrocannabinol: NOT DETECTED

## 2017-09-29 LAB — URINALYSIS, ROUTINE W REFLEX MICROSCOPIC
Bilirubin Urine: NEGATIVE
GLUCOSE, UA: NEGATIVE mg/dL
HGB URINE DIPSTICK: NEGATIVE
Ketones, ur: NEGATIVE mg/dL
LEUKOCYTES UA: NEGATIVE
Nitrite: NEGATIVE
PROTEIN: NEGATIVE mg/dL
Specific Gravity, Urine: 1.004 — ABNORMAL LOW (ref 1.005–1.030)
pH: 7 (ref 5.0–8.0)

## 2017-09-29 LAB — MAGNESIUM: MAGNESIUM: 1.9 mg/dL (ref 1.7–2.4)

## 2017-09-29 MED ORDER — POTASSIUM CHLORIDE CRYS ER 20 MEQ PO TBCR
20.0000 meq | EXTENDED_RELEASE_TABLET | Freq: Once | ORAL | Status: AC
  Administered 2017-09-29: 20 meq via ORAL
  Filled 2017-09-29: qty 1

## 2017-09-29 MED ORDER — POTASSIUM CHLORIDE CRYS ER 20 MEQ PO TBCR
40.0000 meq | EXTENDED_RELEASE_TABLET | Freq: Two times a day (BID) | ORAL | Status: DC
  Administered 2017-09-29 – 2017-09-30 (×3): 40 meq via ORAL
  Filled 2017-09-29 (×4): qty 2

## 2017-09-29 NOTE — ED Provider Notes (Signed)
1:49 PM Pt was given her of potassium and swallowed the pills without difficulty. Safe to move back to the psychiatric unit for ongoing mental health care and placement.    Azalia Bilis, MD 09/29/17 1350

## 2017-09-29 NOTE — ED Notes (Signed)
Pt brought the spinal stimulator back to staff saying that it no longer works because we took the strap off. The spinal stimulator was put back in her belongings bag in locker 41.

## 2017-09-29 NOTE — ED Notes (Signed)
Reported critical potassium of 2.5 to Dr. Sharma Covert and to Dr. Patria Mane.

## 2017-09-29 NOTE — ED Notes (Signed)
Patient is agitated at this time and requesting to go home. Patient explained to that she was not up for discharge that the recommended that she stay. Writer explained to the plan of care with patient. Patient states "I not spending another night in this place and I'm not taking no more potassium in this place. Encouragement and support provide and safety maintain.

## 2017-09-29 NOTE — ED Notes (Signed)
Pt ruminated over the K-Dur for about 15 minutes and ultimately would not take it because she took one this morning. Pt not able to be rationalized with. Reported to Dr. Patria Mane who requested that I speak with Psychiatry about their plans for pt's psychosis. Report this to Dr. Sharma Covert.

## 2017-09-29 NOTE — ED Notes (Signed)
Patient requested to talk to of duty [employer]. Off duty [employer] at bedside.

## 2017-09-29 NOTE — ED Notes (Signed)
Bed: WA06 Expected date:  Expected time:  Means of arrival:  Comments: Hold for 41

## 2017-09-29 NOTE — ED Notes (Signed)
Pt reports needing a "gray box" from her belongings that is a stimulator. There is a Technical brewer in her belongings. This Clinical research associate spoke with Toniann Fail, International aid/development worker, concerning pt using this device because we do not allow electronic devices on the unit.  Pt said that she uses it, "All the time."

## 2017-09-29 NOTE — ED Notes (Signed)
Pt transferred to ED room 6 for management of potassium 2.5. Dr. Patria Mane aware. Belongings remain in room 41 for pt's return.

## 2017-09-29 NOTE — Progress Notes (Signed)
09/29/17  0745  Removed patient belonging bag from room. Patient has two cellphones, clothes, glasses, and license in bag. Bag placed in locker #28.

## 2017-09-29 NOTE — ED Notes (Signed)
Patient sleeping at the time of assessment. Respirations equal and unlabored, skin warm and dry. No acute distress noted. Q 15 min safety checks remain in place and video monitoring.

## 2017-09-29 NOTE — ED Notes (Signed)
Dr. Patria Mane said that pt can have her spinal stimulator and use it until it dies. When it dies we do not have the means to charge it. There was a strap on it which was removed for pt safety. Toniann Fail also spoke with Dr. Patria Mane about the low potassium and Dr. Patria Mane is comfortable with pt not being on telemetry and feels that the Acute Unit is the appropriate unit for her.

## 2017-09-29 NOTE — ED Notes (Signed)
Called lab to add on or draw magnesium level.

## 2017-09-29 NOTE — ED Notes (Signed)
Report called to [facility] and pt moved to Room 6.

## 2017-09-29 NOTE — ED Notes (Signed)
I cared for pt. Briefly, during which interval Dr. Patria Mane examined pt., and in his presence pt. Took 40 meq of K+ p.o. With a Sprite. She ambulates back to rm. 55 with security escort to resume her treatment here.

## 2017-09-30 ENCOUNTER — Other Ambulatory Visit: Payer: Self-pay

## 2017-09-30 ENCOUNTER — Inpatient Hospital Stay (HOSPITAL_COMMUNITY)
Admission: AD | Admit: 2017-09-30 | Discharge: 2017-10-07 | DRG: 885 | Disposition: A | Discharge status: Home / Self Care | Payer: Medicare Other | Attending: Psychiatry | Admitting: Psychiatry

## 2017-09-30 ENCOUNTER — Encounter (HOSPITAL_COMMUNITY): Payer: Self-pay | Admitting: *Deleted

## 2017-09-30 DIAGNOSIS — F909 Attention-deficit hyperactivity disorder, unspecified type: Secondary | ICD-10-CM | POA: Diagnosis present

## 2017-09-30 DIAGNOSIS — F39 Unspecified mood [affective] disorder: Secondary | ICD-10-CM | POA: Diagnosis present

## 2017-09-30 DIAGNOSIS — G4700 Insomnia, unspecified: Secondary | ICD-10-CM | POA: Diagnosis present

## 2017-09-30 DIAGNOSIS — F1290 Cannabis use, unspecified, uncomplicated: Secondary | ICD-10-CM | POA: Diagnosis present

## 2017-09-30 DIAGNOSIS — Z79899 Other long term (current) drug therapy: Secondary | ICD-10-CM | POA: Diagnosis not present

## 2017-09-30 DIAGNOSIS — F314 Bipolar disorder, current episode depressed, severe, without psychotic features: Principal | ICD-10-CM | POA: Diagnosis present

## 2017-09-30 DIAGNOSIS — F419 Anxiety disorder, unspecified: Secondary | ICD-10-CM | POA: Diagnosis present

## 2017-09-30 DIAGNOSIS — F319 Bipolar disorder, unspecified: Secondary | ICD-10-CM | POA: Diagnosis not present

## 2017-09-30 DIAGNOSIS — F17210 Nicotine dependence, cigarettes, uncomplicated: Secondary | ICD-10-CM | POA: Diagnosis present

## 2017-09-30 DIAGNOSIS — E119 Type 2 diabetes mellitus without complications: Secondary | ICD-10-CM | POA: Diagnosis present

## 2017-09-30 DIAGNOSIS — K219 Gastro-esophageal reflux disease without esophagitis: Secondary | ICD-10-CM | POA: Diagnosis present

## 2017-09-30 DIAGNOSIS — Z9114 Patient's other noncompliance with medication regimen: Secondary | ICD-10-CM

## 2017-09-30 DIAGNOSIS — M797 Fibromyalgia: Secondary | ICD-10-CM | POA: Diagnosis present

## 2017-09-30 DIAGNOSIS — G8929 Other chronic pain: Secondary | ICD-10-CM | POA: Diagnosis present

## 2017-09-30 DIAGNOSIS — Z62810 Personal history of physical and sexual abuse in childhood: Secondary | ICD-10-CM | POA: Diagnosis present

## 2017-09-30 DIAGNOSIS — E785 Hyperlipidemia, unspecified: Secondary | ICD-10-CM | POA: Diagnosis present

## 2017-09-30 DIAGNOSIS — J449 Chronic obstructive pulmonary disease, unspecified: Secondary | ICD-10-CM | POA: Diagnosis present

## 2017-09-30 DIAGNOSIS — R451 Restlessness and agitation: Secondary | ICD-10-CM | POA: Diagnosis present

## 2017-09-30 DIAGNOSIS — F3010 Manic episode without psychotic symptoms, unspecified: Secondary | ICD-10-CM | POA: Insufficient documentation

## 2017-09-30 LAB — BASIC METABOLIC PANEL
Anion gap: 9 (ref 5–15)
BUN: 11 mg/dL (ref 6–20)
CALCIUM: 9.2 mg/dL (ref 8.9–10.3)
CO2: 27 mmol/L (ref 22–32)
CREATININE: 0.95 mg/dL (ref 0.44–1.00)
Chloride: 108 mmol/L (ref 98–111)
GFR calc Af Amer: >60 mL/min (ref >60)
GFR calc non Af Amer: >60 mL/min (ref >60)
GLUCOSE: 106 mg/dL — AB (ref 70–99)
Potassium: 3.1 mmol/L — ABNORMAL LOW (ref 3.5–5.1)
Sodium: 144 mmol/L (ref 135–145)

## 2017-09-30 MED ORDER — HYDROXYZINE HCL 25 MG PO TABS
25.0000 mg | ORAL_TABLET | Freq: Four times a day (QID) | ORAL | Status: DC | PRN
  Administered 2017-09-30 – 2017-10-06 (×6): 25 mg via ORAL
  Filled 2017-09-30: qty 6
  Filled 2017-09-30: qty 1
  Filled 2017-09-30: qty 10

## 2017-09-30 MED ORDER — FAMOTIDINE 20 MG PO TABS
ORAL_TABLET | ORAL | Status: AC
  Filled 2017-09-30: qty 1

## 2017-09-30 MED ORDER — POTASSIUM CHLORIDE CRYS ER 20 MEQ PO TBCR
40.0000 meq | EXTENDED_RELEASE_TABLET | Freq: Two times a day (BID) | ORAL | Status: AC
  Administered 2017-09-30 – 2017-10-03 (×6): 40 meq via ORAL
  Filled 2017-09-30 (×9): qty 2

## 2017-09-30 MED ORDER — DULOXETINE HCL 20 MG PO CPEP
20.0000 mg | ORAL_CAPSULE | Freq: Two times a day (BID) | ORAL | Status: DC
  Administered 2017-09-30 – 2017-10-07 (×15): 20 mg via ORAL
  Filled 2017-09-30 (×4): qty 1
  Filled 2017-09-30: qty 14
  Filled 2017-09-30 (×4): qty 1
  Filled 2017-09-30: qty 14
  Filled 2017-09-30 (×12): qty 1

## 2017-09-30 MED ORDER — PANTOPRAZOLE SODIUM 40 MG PO TBEC
40.0000 mg | DELAYED_RELEASE_TABLET | Freq: Every day | ORAL | Status: DC
  Administered 2017-10-01 – 2017-10-07 (×7): 40 mg via ORAL
  Filled 2017-09-30 (×8): qty 1
  Filled 2017-09-30: qty 7
  Filled 2017-09-30 (×2): qty 1

## 2017-09-30 MED ORDER — OLANZAPINE 5 MG PO TABS: 5.0000 mg | ORAL_TABLET | Freq: Every day | ORAL | Status: DC

## 2017-09-30 MED ORDER — ATORVASTATIN CALCIUM 20 MG PO TABS
20.0000 mg | ORAL_TABLET | Freq: Every day | ORAL | Status: DC
  Administered 2017-09-30 – 2017-10-07 (×8): 20 mg via ORAL
  Filled 2017-09-30: qty 2
  Filled 2017-09-30 (×2): qty 1
  Filled 2017-09-30: qty 2
  Filled 2017-09-30 (×5): qty 1
  Filled 2017-09-30: qty 2
  Filled 2017-09-30: qty 7
  Filled 2017-09-30 (×2): qty 1

## 2017-09-30 MED ORDER — NICOTINE 21 MG/24HR TD PT24
21.0000 mg | MEDICATED_PATCH | Freq: Every day | TRANSDERMAL | Status: DC
  Administered 2017-09-30 – 2017-10-07 (×8): 21 mg via TRANSDERMAL
  Filled 2017-09-30 (×12): qty 1

## 2017-09-30 MED ORDER — TRAZODONE HCL 50 MG PO TABS
50.0000 mg | ORAL_TABLET | Freq: Every evening | ORAL | Status: DC | PRN
  Administered 2017-09-30 – 2017-10-06 (×7): 50 mg via ORAL
  Filled 2017-09-30: qty 1
  Filled 2017-09-30: qty 7
  Filled 2017-09-30: qty 1
  Filled 2017-09-30: qty 6

## 2017-09-30 MED ORDER — TRAZODONE HCL 50 MG PO TABS
50.0000 mg | ORAL_TABLET | Freq: Every evening | ORAL | Status: DC | PRN
  Administered 2017-09-30: 50 mg via ORAL
  Filled 2017-09-30: qty 1

## 2017-09-30 MED ORDER — ALUM & MAG HYDROXIDE-SIMETH 200-200-20 MG/5ML PO SUSP: 30.0000 mL | ORAL | Status: DC | PRN

## 2017-09-30 MED ORDER — OLANZAPINE 5 MG PO TABS
5.0000 mg | ORAL_TABLET | ORAL | Status: AC
  Administered 2017-09-30: 5 mg via ORAL
  Filled 2017-09-30 (×2): qty 1

## 2017-09-30 MED ORDER — MAGNESIUM HYDROXIDE 400 MG/5ML PO SUSP: 30.0000 mL | Freq: Every day | ORAL | Status: DC | PRN

## 2017-09-30 MED ORDER — ACETAMINOPHEN 325 MG PO TABS
650.0000 mg | ORAL_TABLET | Freq: Four times a day (QID) | ORAL | Status: DC | PRN
  Administered 2017-09-30 – 2017-10-04 (×2): 650 mg via ORAL
  Filled 2017-09-30 (×3): qty 2

## 2017-09-30 MED ORDER — NICOTINE 7 MG/24HR TD PT24
MEDICATED_PATCH | TRANSDERMAL | Status: AC
  Filled 2017-09-30: qty 1

## 2017-09-30 NOTE — ED Notes (Signed)
Patient denies pain and is resting comfortably.  

## 2017-09-30 NOTE — ED Notes (Signed)
Pt has all belongings. [employer] transporting to [facility]

## 2017-09-30 NOTE — ED Notes (Signed)
Patient refuse to traZOdone at this time. Will offer again later.

## 2017-09-30 NOTE — ED Notes (Signed)
Patient asking for something for sleep Rebecca Conn, Pa contacted and new order for traZODone 50 mg prn at bedtime may repeat x 1 orders read back and verified.

## 2017-09-30 NOTE — ED Notes (Signed)
Mardella Layman, RN [facility]) [facility] noted she will call when facility is ready.

## 2017-09-30 NOTE — ED Notes (Signed)
Patient sleeping at this time but has been up all night. Patient has been hostile and irritable. Encouragement and support provided and safety maintain. Q 15 min safety checks remain in place and video monitoring.

## 2017-09-30 NOTE — Progress Notes (Signed)
Nursing Progress Note: 7p-7a D: Pt currently presents with a labile/irritable affect and behavior. Pt states "I don't want to talk to my daughter no more. She has my medicine messed up and she brought me here. screw her." Interacting minimally/guarded with the milieu. Pt reports good sleep during the previous night with current medication regimen. Pt did attend wrap-up group.  A: Pt provided with medications per providers orders. Pt's labs and vitals were monitored throughout the night. Pt supported emotionally and encouraged to express concerns and questions. Pt educated on medications.  R: Pt's safety ensured with 15 minute and environmental checks. Pt currently denies SI, HI, and AVH. Pt verbally contracts to seek staff if SI,HI, or AVH occurs and to consult with staff before acting on any harmful thoughts. Will continue to monitor.

## 2017-09-30 NOTE — Progress Notes (Signed)
Rebecca [name] is a 57 year old female pt admitted on involuntary basis. On admission, she was irritable and generally uncooperative. She denied any SI and when questions were asked she often would tell me to go ask her daughter. She spoke about how her daughter took out papers against her and reports that she was going to take her medications but she couldn't because her daughter had them. She reports that she wants her medications that she is supposed to be on and not the ones that the hospital has been trying to give her. She reports that she has her own home and reports that her plan is to go back there once she is discharged. Rebecca [name] refused to sing any paperwork and again said for Rebecca [name] to get her daughter to sign them since she is the one that put her here. She did however give verbal permission to give her daughter the code number and to be able to speak to her about her care. [name] was escorted to the unit, oriented to the milieu and safety maintained.

## 2017-09-30 NOTE — ED Notes (Signed)
Report given to Michael, RN.

## 2017-09-30 NOTE — Progress Notes (Signed)
Patient ID: Rebecca [name], female   DOB: 01/31/1960, 57 y.o.   MRN: [id number] Per State regulations 482.30 this chart was reviewed for medical necessity with respect to the patient's admission/duration of stay.    Next review date: 10/04/17  Thurman Coyer, BSN, RN-BC  Case Manager
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
## 2017-09-30 NOTE — BH Assessment (Addendum)
[facility] Assessment Progress Note  Per Juanetta Beets, DO, this pt requires psychiatric hospitalization.  Malva Limes, RN, [facility] has assigned pt to [facility] Rm 507-1; she will call when [facility] is ready to receive pt.  Pt presents under IVC initiated by pt's daughter, and upheld by Dr Sharma Covert, and IVC documents have been faxed to [facility].  Pt's nurse, Angelique Blonder, has been notified, and agrees to call report to [phone].  Pt is to be transported via [facility].   Doylene Canning, [place] Behavioral Health Coordinator [phone]   Addendum:  At 14:50 Linsey calls to report that [facility] will be ready to receive pt at 16:30.  Diane, RN has been notified.  Doylene Canning, [place] Behavioral Health Coordinator [phone]

## 2017-09-30 NOTE — Tx Team (Signed)
Initial Treatment Plan 09/30/2017 6:07 PM Rebecca [name] GMW:[id number]    PATIENT STRESSORS: Marital or family conflict Medication change or noncompliance   PATIENT STRENGTHS: Ability for insight Average or above average intelligence Capable of independent living General fund of knowledge Supportive family/friends   PATIENT IDENTIFIED PROBLEMS: Anxiety  Irritability Depression "I don't know, why don't you ask my daughter"                      DISCHARGE CRITERIA:  Ability to meet basic life and health needs Improved stabilization in mood, thinking, and/or behavior Verbal commitment to aftercare and medication compliance  PRELIMINARY DISCHARGE PLAN: Attend aftercare/continuing care group Return to previous living arrangement  PATIENT/FAMILY INVOLVEMENT: This treatment plan has been presented to and reviewed with the patient, Rebecca [name], and/or family member, .  The patient and family have been given the opportunity to ask questions and make suggestions.  Prabhav Faulkenberry, [place], [place] 09/30/2017, 6:07 PM

## 2017-09-30 NOTE — Progress Notes (Signed)
Pt states, "I need a fluid pill for my swelling and a seizure medication. I haven't taken any in a while. But I need it. Let the doctor know."

## 2017-10-01 DIAGNOSIS — F909 Attention-deficit hyperactivity disorder, unspecified type: Secondary | ICD-10-CM

## 2017-10-01 DIAGNOSIS — F17210 Nicotine dependence, cigarettes, uncomplicated: Secondary | ICD-10-CM

## 2017-10-01 DIAGNOSIS — F314 Bipolar disorder, current episode depressed, severe, without psychotic features: Principal | ICD-10-CM

## 2017-10-01 LAB — LIPID PANEL
Cholesterol: 154 mg/dL (ref 0–200)
HDL: 44 mg/dL (ref >40)
LDL CALC: 77 mg/dL (ref 0–99)
Total CHOL/HDL Ratio: 3.5 RATIO
Triglycerides: 163 mg/dL — ABNORMAL HIGH (ref <150)
VLDL: 33 mg/dL (ref 0–40)

## 2017-10-01 LAB — HEMOGLOBIN A1C
HEMOGLOBIN A1C: 5.5 % (ref 4.8–5.6)
MEAN PLASMA GLUCOSE: 111.15 mg/dL

## 2017-10-01 LAB — TSH: TSH: 1.551 u[IU]/mL (ref 0.350–4.500)

## 2017-10-01 MED ORDER — ARIPIPRAZOLE 15 MG PO TABS
15.0000 mg | ORAL_TABLET | Freq: Every day | ORAL | Status: DC
  Administered 2017-10-01 – 2017-10-07 (×7): 15 mg via ORAL
  Filled 2017-10-01: qty 7
  Filled 2017-10-01 (×9): qty 1

## 2017-10-01 MED ORDER — ALBUTEROL SULFATE HFA 108 (90 BASE) MCG/ACT IN AERS: 2.0000 | INHALATION_SPRAY | RESPIRATORY_TRACT | Status: DC | PRN

## 2017-10-01 NOTE — Progress Notes (Signed)
Patient denies SI, HI and AVH this shift.  Patient has been compliant with medications.  Patient forward little with addressed by writer and is slow to respond to questions.    Assess patient for safety, offer medications as prescribed, engage patient in 1:1 staff talks.   Continue to monitor as planned. Patient able to contract for safety.

## 2017-10-01 NOTE — BHH Group Notes (Signed)
LCSW Group Therapy Note  10/01/2017 1:15pm  Type of Therapy/Topic:  Group Therapy:  Balance in Life  Participation Level:  Did Not Attend  Description of Group:    This group will address the concept of balance and how it feels and looks when one is unbalanced. Patients will be encouraged to process areas in their lives that are out of balance and identify reasons for remaining unbalanced. Facilitators will guide patients in utilizing problem-solving interventions to address and correct the stressor making their life unbalanced. Understanding and applying boundaries will be explored and addressed for obtaining and maintaining a balanced life. Patients will be encouraged to explore ways to assertively make their unbalanced needs known to significant others in their lives, using other group members and facilitator for support and feedback.  Therapeutic Goals: 1. Patient will identify two or more emotions or situations they have that consume much of in their lives. 2. Patient will identify signs/triggers that life has become out of balance:  3. Patient will identify two ways to set boundaries in order to achieve balance in their lives:  4. Patient will demonstrate ability to communicate their needs through discussion and/or role plays  Summary of Patient Progress:      Therapeutic Modalities:   Cognitive Behavioral Therapy Solution-Focused Therapy Assertiveness Training  Ida RogueRodney B Agusta Hackenberg, LCSW 10/01/2017 1:22 PM

## 2017-10-01 NOTE — Progress Notes (Signed)
Nursing Progress Note: 7p-7a D: Pt currently presents with a irritable/labile/somatic/repititve affect and behavior. Pt states "I need my ipod. The doctor wouldn't let me have it." Interacting appropriately with the milieu. Pt reports good sleep during the previous night with current medication regimen. Pt did attend wrap-up group.  A: Pt provided with medications per providers orders. Pt's labs and vitals were monitored throughout the night. Pt supported emotionally and encouraged to express concerns and questions. Pt educated on medications.  R: Pt's safety ensured with 15 minute and environmental checks. Pt currently denies SI, HI, and AVH. Pt verbally contracts to seek staff if SI,HI, or AVH occurs and to consult with staff before acting on any harmful thoughts. Will continue to monitor.

## 2017-10-01 NOTE — Progress Notes (Signed)
Recreation Therapy Notes  INPATIENT RECREATION THERAPY ASSESSMENT  Patient Details Name: Rosey BathKathy Lynn Brickhouse-Tyler MRN: [id number] DOB: 14-May-1960 Today's Date: 10/01/2017       Information Obtained From: Patient  Able to Participate in Assessment/Interview: Yes  Patient Presentation: Confused  Reason for Admission (Per Patient): Other (Comments)(Pt stated her daughter put her here.)  Patient Stressors: (Pt couldn't identify any stressors.)  Coping Skills:   Write, Music, Meditate, Talk, Prayer  Leisure Interests (2+):  Social - Family, Individual - Writing, Community - Other (Comment)(Go out to eat)  Frequency of Recreation/Participation: Other (Comment)(Pt stated she writes daily)  Awareness of Community Resources:  Yes  Community Resources:  Coffee Shop, Engineering geologistLibrary, [employer], Other (Comment)(Book store)  Current Use: Yes  If no, Barriers?:    Expressed Interest in State Street CorporationCommunity Resource Information: No  [place] Energy[place] of Residence:  [place]  Patient Main Form of Transportation: Set designerCar  Patient Strengths:  Clinical research associateWriter; like to drink beer  Patient Identified Areas of Improvement:  "I don't know"  Patient Goal for Hospitalization:  "Get back to my part-time job"  Current SI (including self-harm):  No  Current HI:  No  Current AVH: No  Staff Intervention Plan: Group Attendance, Collaborate with Interdisciplinary Treatment Team  Consent to Intern Participation: N/A    Caroll RancherMarjette Triva Hueber, LRT/CTRS   Caroll RancherLindsay, Tanuj Mullens A 10/01/2017, 12:52 PM
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
## 2017-10-01 NOTE — Progress Notes (Signed)
Patient attended group but refused to share.  
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
## 2017-10-01 NOTE — Progress Notes (Signed)
Recreation Therapy Notes  Date: 9.12.19 Time: 0955 Location: [address] Dayroom  Group Topic: Self-Esteem  Goal Area(s) Addresses:  Patient will successfully identify positive attributes about themselves.  Patient will successfully identify benefit of improved self-esteem.   Behavioral Response: Minimal  Intervention: Worksheet, colored pencils, markers  Activity: Customized Plates.  Patients were given a blank outline of a license plate.  Patients were to design the plate with things that describe them such as birth dates, favorite foods, important dates, accomplishments, things that make them unique or things they are proud of.  Education:  Self-Esteem, Building control surveyorDischarge Planning.   Education Outcome: Acknowledges education/In group clarification offered/Needs additional education  Clinical Observations/Feedback: Pt expressed your family can influence self-esteem.  Pt seemed confused and unable to comprehend the instructions of the activity.  LRT gave pt 1:1 assistance and showed her examples but she still couldn't grasp the instructions.    Caroll RancherMarjette Quinterious Walraven, LRT/CTRS      Caroll RancherLindsay, Atilla Zollner A 10/01/2017 11:41 AM

## 2017-10-02 LAB — PROLACTIN: Prolactin: 19 ng/mL (ref 4.8–23.3)

## 2017-10-02 MED ORDER — HYDROCORTISONE 1 % EX CREA
TOPICAL_CREAM | Freq: Two times a day (BID) | CUTANEOUS | Status: DC
  Administered 2017-10-02 – 2017-10-03 (×2): via TOPICAL
  Administered 2017-10-03: 1 via TOPICAL
  Administered 2017-10-04 – 2017-10-06 (×5): via TOPICAL
  Administered 2017-10-07: 1 via TOPICAL
  Administered 2017-10-07: 17:00:00 via TOPICAL
  Filled 2017-10-02 (×3): qty 28

## 2017-10-02 NOTE — Progress Notes (Signed)
Adult Psychoeducational Group Note  Date:  10/02/2017 Time:  9:30 PM  Group Topic/Focus:  Wrap-Up Group:   The focus of this group is to help patients review their daily goal of treatment and discuss progress on daily workbooks.  Participation Level:  Active  Participation Quality:  Appropriate  Affect:  Appropriate  Cognitive:  Appropriate  Insight: Appropriate  Engagement in Group:  Engaged  Modes of Intervention:  Discussion  Additional Comments: Patient attended group and said that her day was a 9. She spend her day going to groups, walking outside and socializing.   Jquan Egelston W Tiwanna Tuch 10/02/2017, 9:30 PM

## 2017-10-02 NOTE — [facility] Group Notes (Signed)
[facility] LCSW Group Therapy  10/02/2017  1:05 PM  Type of Therapy:  Group therapy  Participation Level: In and out a couple of times  Participation Quality:  Attentive  Affect:  Flat  Cognitive:  Oriented  Insight:  Limited  Engagement in Therapy:  Limited  Modes of Intervention:  Discussion, Socialization  Summary of Progress/Problems:  Chaplain was here to lead a group on themes of hope and courage. Unable to contribute in any meaningful way.  Appeared confused. Rebecca [name], [name] B 10/02/2017 2:34 PM

## 2017-10-02 NOTE — Tx Team (Signed)
Interdisciplinary Treatment and Diagnostic Plan Update  10/02/2017 Time of Session: 10:06 AM  Rebecca [name] MRN: [id number]  Principal Diagnosis: Bipolar 1 disorder, depressed, severe ([place])  Secondary Diagnoses: Principal Problem:   Bipolar 1 disorder, depressed, severe ([place])   Current Medications:  Current Facility-Administered Medications  Medication Dose Route Frequency Provider Last Rate Last Dose  . acetaminophen (TYLENOL) tablet 650 mg  650 mg Oral Q6H PRN Lindon Romp A, NP   650 mg at 09/30/17 2145  . albuterol (PROVENTIL HFA;VENTOLIN HFA) 108 (90 Base) MCG/ACT inhaler 2 puff  2 puff Inhalation Q4H PRN Pennelope Bracken, MD      . alum & mag hydroxide-simeth (MAALOX/MYLANTA) 200-200-20 MG/5ML suspension 30 mL  30 mL Oral Q4H PRN Lindon Romp A, NP      . ARIPiprazole (ABILIFY) tablet 15 mg  15 mg Oral Daily Pennelope Bracken, MD   15 mg at 10/02/17 0746  . atorvastatin (LIPITOR) tablet 20 mg  20 mg Oral Daily Lindon Romp A, NP   20 mg at 10/02/17 0746  . DULoxetine (CYMBALTA) DR capsule 20 mg  20 mg Oral BID Lindon Romp A, NP   20 mg at 10/02/17 0746  . hydrOXYzine (ATARAX/VISTARIL) tablet 25 mg  25 mg Oral Q6H PRN Lindon Romp A, NP   25 mg at 10/01/17 2129  . magnesium hydroxide (MILK OF MAGNESIA) suspension 30 mL  30 mL Oral Daily PRN Lindon Romp A, NP      . nicotine (NICODERM CQ - dosed in mg/24 hours) patch 21 mg  21 mg Transdermal Daily Pennelope Bracken, MD   21 mg at 10/02/17 0749  . pantoprazole (PROTONIX) EC tablet 40 mg  40 mg Oral Daily Lindon Romp A, NP   40 mg at 10/02/17 0746  . potassium chloride SA (K-DUR,KLOR-CON) CR tablet 40 mEq  40 mEq Oral BID Lindon Romp A, NP   40 mEq at 10/02/17 0747  . traZODone (DESYREL) tablet 50 mg  50 mg Oral QHS PRN Rozetta Nunnery, NP   50 mg at 10/01/17 2129    PTA Medications: Medications Prior to Admission  Medication Sig Dispense Refill Last Dose  . atorvastatin (LIPITOR) 20 MG tablet  Take 20 mg by mouth daily.   Past Month at Unknown time  . diazepam (VALIUM) 10 MG tablet Take 10 mg by mouth every 8 (eight) hours as needed for anxiety.     . DULoxetine (CYMBALTA) 20 MG capsule Take 20 mg by mouth 2 (two) times daily.   Past Month at Unknown time  . hydrochlorothiazide (MICROZIDE) 12.5 MG capsule Take 12.5 mg by mouth daily.   Past Month at Unknown time  . hydrOXYzine (ATARAX/VISTARIL) 25 MG tablet Take 25 mg by mouth every 6 (six) hours as needed for itching.     . medroxyPROGESTERone (DEPO-PROVERA) 150 MG/ML injection Inject 1 mL (150 mg total) into the muscle every 3 (three) months. (Patient not taking: Reported on 09/29/2017) 1 mL 3 Not Taking at Unknown time  . pantoprazole (PROTONIX) 40 MG tablet Take 40 mg by mouth daily.       Patient Stressors: Marital or family conflict Medication change or noncompliance  Patient Strengths: Ability for insight Average or above average intelligence Capable of independent living General fund of knowledge Supportive family/friends  Treatment Modalities: Medication Management, Group therapy, Case management,  1 to 1 session with clinician, Psychoeducation, Recreational therapy.   Physician Treatment Plan for Primary Diagnosis: Bipolar 1 disorder, depressed, severe ([place])  Long Term Goal(s): Improvement in symptoms so as ready for discharge  Short Term Goals: Ability to identify and develop effective coping behaviors will improve Ability to demonstrate self-control will improve  Medication Management: Evaluate patient's response, side effects, and tolerance of medication regimen.  Therapeutic Interventions: 1 to 1 sessions, Unit Group sessions and Medication administration.  Evaluation of Outcomes: Progressing  Physician Treatment Plan for Secondary Diagnosis: Principal Problem:   Bipolar 1 disorder, depressed, severe ([place])   Long Term Goal(s): Improvement in symptoms so as ready for discharge  Short Term Goals: Ability  to identify and develop effective coping behaviors will improve Ability to demonstrate self-control will improve  Medication Management: Evaluate patient's response, side effects, and tolerance of medication regimen.  Therapeutic Interventions: 1 to 1 sessions, Unit Group sessions and Medication administration.  Evaluation of Outcomes: Progressing   RN Treatment Plan for Primary Diagnosis: Bipolar 1 disorder, depressed, severe ([place]) Long Term Goal(s): Knowledge of disease and therapeutic regimen to maintain health will improve  Short Term Goals: Ability to identify and develop effective coping behaviors will improve and Compliance with prescribed medications will improve  Medication Management: RN will administer medications as ordered by provider, will assess and evaluate patient's response and provide education to patient for prescribed medication. RN will report any adverse and/or side effects to prescribing provider.  Therapeutic Interventions: 1 on 1 counseling sessions, Psychoeducation, Medication administration, Evaluate responses to treatment, Monitor vital signs and CBGs as ordered, Perform/monitor CIWA, COWS, AIMS and Fall Risk screenings as ordered, Perform wound care treatments as ordered.  Evaluation of Outcomes: Progressing   LCSW Treatment Plan for Primary Diagnosis: Bipolar 1 disorder, depressed, severe ([place]) Long Term Goal(s): Safe transition to appropriate next level of care at discharge, Engage patient in therapeutic group addressing interpersonal concerns.  Short Term Goals: Engage patient in aftercare planning with referrals and resources  Therapeutic Interventions: Assess for all discharge needs, 1 to 1 time with Social worker, Explore available resources and support systems, Assess for adequacy in community support network, Educate family and significant other(s) on suicide prevention, Complete Psychosocial Assessment, Interpersonal group therapy.  Evaluation of  Outcomes: Met  Return home, follow up outpt   Progress in Treatment: Attending groups: Yes Participating in groups: Yes Taking medication as prescribed: Yes Toleration medication: Yes, no side effects reported at this time Family/Significant other contact made: No Patient understands diagnosis: No limited insight Discussing patient identified problems/goals with staff: Yes Medical problems stabilized or resolved: Yes Denies suicidal/homicidal ideation: Yes Issues/concerns per patient self-inventory: None Other: N/A  New problem(s) identified: None identified at this time.   New Short Term/Long Term Goal(s):"Like I told you yesterday, I write. September 13 is the anniversary of  grandaughters death 8 years ago."  Discharge Plan or Barriers:   Reason for Continuation of Hospitalization: Disorganization Medication stabilization   Estimated Length of Stay: 9/18  Attendees: Patient: Rebecca [name]-pt refused to sign 10/02/2017  10:06 AM  Physician: Maris Berger, MD 10/02/2017  10:06 AM  Nursing: Sena Hitch, RN 10/02/2017  10:06 AM  RN Care Manager: Lars Pinks, RN 10/02/2017  10:06 AM  Social Worker: Ripley Fraise 10/02/2017  10:06 AM  Recreational Therapist: Winfield Cunas 10/02/2017  10:06 AM  Other: Norberto Sorenson 10/02/2017  10:06 AM  Other:  10/02/2017  10:06 AM    Scribe for Treatment Team:  Roque Lias LCSW 10/02/2017 10:06 AM

## 2017-10-02 NOTE — Progress Notes (Signed)
[facility] MD Progress Note  10/02/2017 12:46 PM Rebecca [name]  MRN:  [id number] Subjective:    Rebecca [name] is a 57 y/o F with history of Bipolar I and ADHD who was admitted from [facility] on IVC initiated by her daughter due to worsening agitation, disorganized behaviors, throwing objects, ripping out her own hair, responding to internal stimuli, and medication non-adherence. Pt was flat with increased latency and poor ability to provide history in the ED. She was medically cleared and then transferred to [facility] for additional treatment and stabilization. She was restarted on previous medication of cymbalta and she was also started on trial of abilify.  Today upon evaluation, pt was asked how she is doing, and pt shares, "I'm great." Pt was asked if she is having problems and she responds, "My writing." Pt was asked to elaborate but she has thought blocking to some degree but she also appears mildly irritated and guarded with her responses. She denies SI/HI/AH/VH. She denies physical complaints. She is tolerating her medications without difficulty or side effects. Pt is tearful and she shares that her grandchild died on "2022-06-03 the 06-06-22" today the 13th of September 2019 is on a 2022/06/03, so she feels reminded of this. Pt continue remains guarded and minimal with further responses. She agrees to continue her current regimen without changes. She agrees to allow treatment team to reach out to her daughter. Collateral information obtained via telephone from pt's daughter suggests that she has been decompensating for several weeks prior to admission. She has not been keeping up with her bills or rent and she is imminently being evicted from her apartment in [place], [place]. She also stole a dog prior to coming to the [place] area, and pt's daughter is in process of returning dog to the owner. Pt's daughter also reports previous episodes of mania and decompensation, for which pt was hospitalized and she has  been off of psychotropic medications aside from anxiolytics since around 2012.  Principal Problem: Bipolar 1 disorder, depressed, severe (H[facility]) Diagnosis:   Patient Active Problem List   Diagnosis Date Noted  . Bipolar 1 disorder, depressed, severe (H[facility]) [F31.4] 09/30/2017  . Manic behavior ([facility]) [F30.10]   . Affective psychosis, bipolar ([facility]) [F31.9]    Total Time spent with patient: 30 minutes  Past Psychiatric History: see H&P  Past Medical History:  Past Medical History:  Diagnosis Date  . ADHD (attention deficit hyperactivity disorder)   . Back pain   . Carpal tunnel syndrome   . Chronic pain   . Diabetes mellitus   . Fibromyalgia   . GERD (gastroesophageal reflux disease)   . Mitral valve prolapse   . Mood disorder [facility])     Past Surgical History:  Procedure Laterality Date  . ABDOMINAL HYSTERECTOMY    . BACK SURGERY    . CARPAL TUNNEL RELEASE    . mood disorder    . neuro stimulator     Pt had neuro stiumlator implated in back   . SPINAL CORD STIMULATOR IMPLANT     Family History: History reviewed. No pertinent family history. Family Psychiatric  History: see H&P Social History:  Social History   Substance and Sexual Activity  Alcohol Use Yes   Comment:       Social History   Substance and Sexual Activity  Drug Use Yes  . Types: Marijuana    Social History   Socioeconomic History  . Marital status: Married    Spouse name: Not on file  .  Number of children: Not on file  . Years of education: Not on file  . Highest education level: Not on file  Occupational History  . Not on file  Social Needs  . Financial resource strain: Not on file  . Food insecurity:    Worry: Not on file    Inability: Not on file  . Transportation needs:    Medical: Not on file    Non-medical: Not on file  Tobacco Use  . Smoking status: Current Every Day Smoker    Packs/day: 1.00    Types: Cigarettes  . Smokeless tobacco: Never Used  Substance and Sexual Activity  .  Alcohol use: Yes    Comment:    . Drug use: Yes    Types: Marijuana  . Sexual activity: Not Currently  Lifestyle  . Physical activity:    Days per week: Not on file    Minutes per session: Not on file  . Stress: Not on file  Relationships  . Social connections:    Talks on phone: Not on file    Gets together: Not on file    Attends religious service: Not on file    Active member of club or organization: Not on file    Attends meetings of clubs or organizations: Not on file    Relationship status: Not on file  Other Topics Concern  . Not on file  Social History Narrative   ** Merged History Encounter **       Additional Social History:                         Sleep: Good  Appetite:  Good  Current Medications: Current Facility-Administered Medications  Medication Dose Route Frequency Provider Last Rate Last Dose  . acetaminophen (TYLENOL) tablet 650 mg  650 mg Oral Q6H PRN Nira Conn A, NP   650 mg at 09/30/17 2145  . albuterol (PROVENTIL HFA;VENTOLIN HFA) 108 (90 Base) MCG/ACT inhaler 2 puff  2 puff Inhalation Q4H PRN Micheal Likens, MD      . alum & mag hydroxide-simeth (MAALOX/MYLANTA) 200-200-20 MG/5ML suspension 30 mL  30 mL Oral Q4H PRN Nira Conn A, NP      . ARIPiprazole (ABILIFY) tablet 15 mg  15 mg Oral Daily Micheal Likens, MD   15 mg at 10/02/17 0746  . atorvastatin (LIPITOR) tablet 20 mg  20 mg Oral Daily Nira Conn A, NP   20 mg at 10/02/17 0746  . DULoxetine (CYMBALTA) DR capsule 20 mg  20 mg Oral BID Nira Conn A, NP   20 mg at 10/02/17 0746  . hydrOXYzine (ATARAX/VISTARIL) tablet 25 mg  25 mg Oral Q6H PRN Nira Conn A, NP   25 mg at 10/01/17 2129  . magnesium hydroxide (MILK OF MAGNESIA) suspension 30 mL  30 mL Oral Daily PRN Nira Conn A, NP      . nicotine (NICODERM CQ - dosed in mg/24 hours) patch 21 mg  21 mg Transdermal Daily Micheal Likens, MD   21 mg at 10/02/17 0749  . pantoprazole (PROTONIX) EC  tablet 40 mg  40 mg Oral Daily Nira Conn A, NP   40 mg at 10/02/17 0746  . potassium chloride SA (K-DUR,KLOR-CON) CR tablet 40 mEq  40 mEq Oral BID Nira Conn A, NP   40 mEq at 10/02/17 0747  . traZODone (DESYREL) tablet 50 mg  50 mg Oral QHS PRN Jackelyn Poling, NP  50 mg at 10/01/17 2129    Lab Results:  Results for orders placed or performed during the hospital encounter of 09/30/17 (from the past 48 hour(s))  Hemoglobin A1c     Status: None   Collection Time: 10/01/17  7:03 AM  Result Value Ref Range   Hgb A1c MFr Bld 5.5 4.8 - 5.6 %    Comment: (NOTE) Pre diabetes:          5.7%-6.4% Diabetes:              >6.4% Glycemic control for   <7.0% adults with diabetes    Mean Plasma Glucose 111.15 mg/dL    Comment: Performed at [facility], [address] [place], [place] [address]  Lipid panel     Status: Abnormal   Collection Time: 10/01/17  7:03 AM  Result Value Ref Range   Cholesterol 154 0 - 200 mg/dL   Triglycerides 237163 (H) <150 mg/dL   HDL 44 >62>40 mg/dL   Total CHOL/HDL Ratio 3.5 RATIO   VLDL 33 0 - 40 mg/dL   LDL Cholesterol 77 0 - 99 mg/dL    Comment:        Total Cholesterol/HDL:CHD Risk Coronary Heart Disease Risk Table                     Men   Women  1/2 Average Risk   3.4   3.3  Average Risk       5.0   4.4  2 X Average Risk   9.6   7.1  3 X Average Risk  23.4   11.0        Use the calculated Patient Ratio above and the CHD Risk Table to determine the patient's CHD Risk.        ATP III CLASSIFICATION (LDL):  <100     mg/dL   Optimal  [id number]  mg/dL   Near or Above                    Optimal  130-159  mg/dL   Borderline  [phone]  mg/dL   High  >710>190     mg/dL   Very High Performed at [facility], [address] [place], [place] [address]   Prolactin     Status: None   Collection Time: 10/01/17  7:03 AM  Result Value Ref Range   Prolactin 19.0 4.8 - 23.3 ng/mL    Comment: (NOTE) Performed At: [facility] [address] [place], [place] [id number] Jolene SchimkeNagendra Sanjai MD KK:[id number]:[phone]   TSH     Status: None   Collection Time: 10/01/17  7:03 AM  Result Value Ref Range   TSH 1.551 0.350 - 4.500 uIU/mL    Comment: Performed by a 3rd Generation assay with a functional sensitivity of <=0.01 uIU/mL. Performed at [facility], [address] [place], [place] [address]     Blood Alcohol level:  Lab Results  Component Value Date   [facility] 09/28[facility]2017   ETH <11 09/07/2011    Metabolic Disorder Labs: Lab Results  Component Value Date   HGBA1C 5.5 10/01/2017   MPG 111.15 10/01/2017   MPG 105 01/08/2009   Lab Results  Component Value Date   PROLACTIN 19.0 10/01/2017   Lab Results  Component Value Date   CHOL 154 10/01/2017   TRIG 163 (H) 10/01/2017   HDL 44 10/01/2017   CHOLHDL 3.5 10/01/2017   VLDL  33 10/01/2017   LDLCALC 77 10/01/2017   LDLCALC  01/08/2009    41        Total Cholesterol/HDL:CHD Risk Coronary Heart Disease Risk Table                     Men   Women  1/2 Average Risk   3.4   3.3  Average Risk       5.0   4.4  2 X Average Risk   9.6   7.1  3 X Average Risk  23.4   11.0        Use the calculated Patient Ratio above and the CHD Risk Table to determine the patient's CHD Risk.        ATP III CLASSIFICATION (LDL):  <100     mg/dL   Optimal  829-562  mg/dL   Near or Above                    Optimal  130-159  mg/dL   Borderline  130-865  mg/dL   High  >784     mg/dL   Very High    Physical Findings: AIMS: Facial and Oral Movements Muscles of Facial Expression: None, normal Lips and Perioral Area: None, normal Jaw: None, normal Tongue: None, normal,Extremity Movements Upper (arms, wrists, hands, fingers): None, normal Lower (legs, knees, ankles, toes): None, normal, Trunk Movements Neck, shoulders, hips: None, normal, Overall Severity Severity of abnormal movements (highest score from questions above): None, normal Incapacitation due to abnormal  movements: None, normal Patient's awareness of abnormal movements (rate only patient's report): No Awareness, Dental Status Current problems with teeth and/or dentures?: No Does patient usually wear dentures?: No  CIWA:    COWS:     Musculoskeletal: Strength & Muscle Tone: within normal limits Gait & Station: normal Patient leans: N/A  Psychiatric Specialty Exam: Physical Exam  Nursing note and vitals reviewed.   Review of Systems  Constitutional: Negative for chills and fever.  Respiratory: Negative for cough and shortness of breath.   Cardiovascular: Negative for chest pain.  Gastrointestinal: Negative for abdominal pain, heartburn, nausea and vomiting.  Psychiatric/Behavioral: Negative for depression, hallucinations and suicidal ideas. The patient is not nervous/anxious and does not have insomnia.     Blood pressure 92/80, pulse 92, temperature 98.3 F (36.8 C), temperature source Oral, resp. rate 18, height 5\' 2"  (1.575 m), weight 87.5 kg.Body mass index is 35.3 kg/m.  General Appearance: Casual and Fairly Groomed  Eye Contact:  Good  Speech:  Blocked and Clear and Coherent  Volume:  Normal  Mood:  Anxious, Depressed and Irritable  Affect:  Constricted, Depressed, Flat and Tearful  Thought Process:  Coherent, Disorganized, Goal Directed and Descriptions of Associations: Loose  Orientation:  Full (Time, Place, and Person)  Thought Content:  Delusions, Paranoid Ideation and Rumination  Suicidal Thoughts:  No  Homicidal Thoughts:  No  Memory:  Immediate;   Fair Recent;   Fair Remote;   Fair  Judgement:  Poor  Insight:  Lacking  Psychomotor Activity:  Normal  Concentration:  Concentration: Poor  Recall:  Poor  Fund of Knowledge:  Poor  Language:  Poor  Akathisia:  No  Handed:    AIMS (if indicated):     Assets:  Resilience Social Support  ADL's:  Intact  Cognition:  WNL  Sleep:  Number of Hours: 6   Treatment Plan Summary: Daily contact with patient to assess  and evaluate symptoms and  progress in treatment and Medication management   -Continue inpatient hospitalization  -Bipolar current episode depressed, severe, with psychosis   -Continue abilify 15mg  po qDay   -Continue cymbalta 20mg  po BID  -anxiety     -Continue vistaril 25mg  po q6h prn anxiety  -insomnia  - Continue trazodone 50mg  po qhs prn insomnia  -GERD   -Continue protonix 40mg  po qDay  -HLD   -Continue lipitor 20mg  po qDay  -COPD   -Continue albuterol 127mcg/act take 2 puffs q4h prn wheeze/SOB  -Encourage participation in groups and therapeutic milieu  -disposition planning will be ongoing  Micheal Likens, MD 10/02/2017, 12:46 PM

## 2017-10-02 NOTE — Progress Notes (Signed)
Nursing Progress Note: 7p-7a D: Pt currently presents with a anxious/irritable/improved affect and behavior. Pt states "Today is bad, but not as bad as yesterday." Interacting minimally with the milieu. Pt reports good sleep during the previous night with current medication regimen. Pt did attend wrap-up group.  A: Pt provided with medications per providers orders. Pt's labs and vitals were monitored throughout the night. Pt supported emotionally and encouraged to express concerns and questions. Pt educated on medications.  R: Pt's safety ensured with 15 minute and environmental checks. Pt currently denies SI, HI, and AVH. Pt verbally contracts to seek staff if SI,HI, or AVH occurs and to consult with staff before acting on any harmful thoughts. Will continue to monitor.

## 2017-10-03 DIAGNOSIS — E785 Hyperlipidemia, unspecified: Secondary | ICD-10-CM

## 2017-10-03 DIAGNOSIS — F419 Anxiety disorder, unspecified: Secondary | ICD-10-CM

## 2017-10-03 DIAGNOSIS — F1290 Cannabis use, unspecified, uncomplicated: Secondary | ICD-10-CM

## 2017-10-03 DIAGNOSIS — J449 Chronic obstructive pulmonary disease, unspecified: Secondary | ICD-10-CM

## 2017-10-03 DIAGNOSIS — K219 Gastro-esophageal reflux disease without esophagitis: Secondary | ICD-10-CM

## 2017-10-03 DIAGNOSIS — G4700 Insomnia, unspecified: Secondary | ICD-10-CM

## 2017-10-03 NOTE — [facility] Group Notes (Signed)
Adult Psychoeducational Group Note  Date:  10/03/2017 Time:  10:29 AM  Group Topic/Focus:  Building Self Esteem:   The Focus of this group is helping patients become aware of the effects of self-esteem on their lives, the things they and others do that enhance or undermine their self-esteem, seeing the relationship between their level of self-esteem and the choices they make and learning ways to enhance self-esteem.  Participation Level:  Active  Participation Quality:  Appropriate  Affect:  Appropriate  Cognitive:  Appropriate  Insight: Appropriate and Good  Engagement in Group:  Engaged  Modes of Intervention:  Discussion  Additional Comments:   Chauncey FischerRobinson, Miley Lindon Long 10/03/2017, 10:29 AM

## 2017-10-03 NOTE — Progress Notes (Signed)
Adult Psychoeducational Group Note  Date:  10/03/2017 Time:  9:01 PM  Group Topic/Focus:  Wrap-Up Group:   The focus of this group is to help patients review their daily goal of treatment and discuss progress on daily workbooks.  Participation Level:  Active  Participation Quality:  Appropriate  Affect:  Appropriate  Cognitive:  Appropriate  Insight: Appropriate  Engagement in Group:  Engaged  Modes of Intervention:  Discussion  Additional Comments:  The patient expressed that she rates today a 9.The patient also said that she attended groups. Octavio Mannshigpen, Tiffiney Sparrow Lee 10/03/2017, 9:01 PM

## 2017-10-03 NOTE — [facility] Group Notes (Signed)
LCSW Group Therapy Note  10/03/2017    11:00am - 12:00pm  Type of Therapy and Topic:  Group Therapy: Anger and Coping Skills  Participation Level:  Minimal   Description of Group:   In this group, patients learned how to recognize the physical, cognitive, emotional, and behavioral responses they have to anger-provoking situations.  They identified how they usually or often react when angered, and learned how healthy and unhealthy coping skills work initially, but the unhealthy ones stop working.   They analyzed how their frequently-chosen coping skill is possibly beneficial and how it is possibly unhelpful.  The group discussed a variety of healthier coping skills that could help in resolving the actual issues, as well as how to go about planning for the the possibility of future similar situations.  Therapeutic Goals: 1. Patients will identify one thing that makes them angry and how they feel emotionally and physically, what their thoughts are or tend to be in those situations, and what healthy or unhealthy coping mechanism they typically use 2. Patients will identify how their coping technique works for them, as well as how it works against them. 3. Patients will explore possible new behaviors to use in future anger situations. 4. Patients will learn that anger itself is normal and cannot be eliminated, and that healthier coping skills can assist with resolving conflict rather than worsening situations.  Summary of Patient Progress:  The patient shared that she was "irritated" a few hours ago.  She also said she did not want to share the reasons, and she did not share the remainder of group.  Therapeutic Modalities:   Cognitive Behavioral Therapy Motivation Interviewing  Lynnell ChadMareida J Grossman-Orr

## 2017-10-03 NOTE — Progress Notes (Signed)
DAR NOTE: Patient presents with anxious affect and depressed mood.  Been visible in the milieu with peers. Reports poor sleep, good appetite, and low energy. Denies pain, auditory and visual hallucinations.  Rates depression at 3, hopelessness at 3, and anxiety at 3.  Maintained on routine safety checks.  Medications given as prescribed.  Support and encouragement offered as needed.  Attended group and participated.  Will continue to monitor.

## 2017-10-03 NOTE — Progress Notes (Signed)
[facility]  10/03/2017 3:07 PM Onnie BoerKathy Lynn Brickhouse-Tyler  MRN:  [id number]  Subjective: Christle-Lynn reports, "I'm doing alright. The medicines are helping. I am sleeping well. No side effects".  [name] is a 57 y/o F with history of Bipolar I and ADHD who was admitted from [facility] on IVC initiated by her daughter due to worsening agitation, disorganized behaviors, throwing objects, ripping out her own hair, responding to internal stimuli, and medication non-adherence. Pt was flat with increased latency and poor ability to provide history in the ED. She was medically cleared and then [facility] and [facility]. She was restarted on previous medication of cymbalta and she was also started on trial of abilify.  Today upon evaluation, pt was asked how she is doing, and pt shares, "I'm doing alright. She continues to present with thought blocking to some degree & she also appears somewhat guarded. She denies SI/HI/AH/VH. She denies physical complaints. She is tolerating her medications without difficulty or side effects. She is very minimal with her responses. She agrees to continue her current regimen without changes. She had previously agreed to allow the treatment team to reach out to her daughter. Collateral information obtained via telephone from pt's daughter suggests that she has been decompensating for several weeks prior to admission. She has not been keeping up with her bills or rent and she is imminently being evicted from her apartment in [place], [place]. She also stole a dog prior to coming to the [place] area, and pt's daughter is in process of returning dog to the owner. Pt's daughter also reports previous episodes of mania and decompensation, for which pt was hospitalized and she has been off of psychotropic medications aside from anxiolytics since around 2012.  Principal Problem: Bipolar 1 disorder, depressed, severe (HCC) Diagnosis:    Patient Active Problem List   Diagnosis Date Noted  . Bipolar 1 disorder, depressed, severe (HCC) [F31.4] 09/30/2017  . Manic behavior (HCC) [F30.10]   . Affective psychosis, bipolar (HCC) [F31.9]    Total Time spent with patient: 15 minutes  Past Psychiatric History: See H&P  Past Medical History:  Past Medical History:  Diagnosis Date  . ADHD (attention deficit hyperactivity disorder)   . Back pain   . Carpal tunnel syndrome   . Chronic pain   . Diabetes mellitus   . Fibromyalgia   . GERD (gastroesophageal reflux disease)   . Mitral valve prolapse   . Mood disorder [facility](HCC)     Past Surgical History:  Procedure Laterality Date  . ABDOMINAL HYSTERECTOMY    . BACK SURGERY    . CARPAL TUNNEL RELEASE    . mood disorder    . neuro stimulator     Pt had neuro stiumlator implated in back   . SPINAL CORD STIMULATOR IMPLANT     Family History: History reviewed. No pertinent family history.  Family Psychiatric  History: See H&P  Social History:  Social History   Substance and Sexual Activity  Alcohol Use Yes   Comment:       Social History   Substance and Sexual Activity  Drug Use Yes  . Types: Marijuana    Social History   Socioeconomic History  . Marital status: Married    Spouse name: Not on file  . Number of children: Not on file  . Years of education: Not on file  . Highest education level: Not on file  Occupational History  . Not on file  Social  Needs  . Financial resource strain: Not on file  . Food insecurity:    Worry: Not on file    Inability: Not on file  . Transportation needs:    Medical: Not on file    Non-medical: Not on file  Tobacco Use  . Smoking status: Current Every Day Smoker    Packs/day: 1.00    Types: Cigarettes  . Smokeless tobacco: Never Used  Substance and Sexual Activity  . Alcohol use: Yes    Comment:    . Drug use: Yes    Types: Marijuana  . Sexual activity: Not Currently  Lifestyle  . Physical activity:    Days per  week: Not on file    Minutes per session: Not on file  . Stress: Not on file  Relationships  . Social connections:    Talks on phone: Not on file    Gets together: Not on file    Attends religious service: Not on file    Active member of club or organization: Not on file    Attends meetings of clubs or organizations: Not on file    Relationship status: Not on file  Other Topics Concern  . Not on file  Social History Narrative   ** Merged History Encounter **       Additional Social History:   Sleep: Good  Appetite:  Good  Current Medications: Current Facility-Administered Medications  Medication Dose Route Frequency Provider Last Rate Last Dose  . acetaminophen (TYLENOL) tablet 650 mg  650 mg Oral Q6H PRN Nira Conn A, NP   650 mg at 09/30/17 2145  . albuterol (PROVENTIL HFA;VENTOLIN HFA) 108 (90 Base) MCG/ACT inhaler 2 puff  2 puff Inhalation Q4H PRN Micheal Likens, MD      . alum & mag hydroxide-simeth (MAALOX/MYLANTA) 200-200-20 MG/5ML suspension 30 mL  30 mL Oral Q4H PRN Nira Conn A, NP      . ARIPiprazole (ABILIFY) tablet 15 mg  15 mg Oral Daily Micheal Likens, MD   15 mg at 10/03/17 0809  . atorvastatin (LIPITOR) tablet 20 mg  20 mg Oral Daily Nira Conn A, NP   20 mg at 10/03/17 0809  . DULoxetine (CYMBALTA) DR capsule 20 mg  20 mg Oral BID Nira Conn A, NP   20 mg at 10/03/17 0809  . hydrocortisone cream 1 %   Topical BID Money, Gerlene Burdock, FNP      . hydrOXYzine (ATARAX/VISTARIL) tablet 25 mg  25 mg Oral Q6H PRN Nira Conn A, NP   25 mg at 10/02/17 2243  . magnesium hydroxide (MILK OF MAGNESIA) suspension 30 mL  30 mL Oral Daily PRN Nira Conn A, NP      . nicotine (NICODERM CQ - dosed in mg/24 hours) patch 21 mg  21 mg Transdermal Daily Micheal Likens, MD   21 mg at 10/03/17 0811  . pantoprazole (PROTONIX) EC tablet 40 mg  40 mg Oral Daily Nira Conn A, NP   40 mg at 10/03/17 0809  . traZODone (DESYREL) tablet 50 mg  50 mg Oral  QHS PRN Nira Conn A, NP   50 mg at 10/02/17 2243   Lab Results:  No results found for this or any previous visit (from the past 48 hour(s)).  Blood Alcohol level:  Lab Results  Component Value Date   [facility] <10 09/28/2017   ETH <11 09/07/2011   Metabolic Disorder Labs: Lab Results  Component Value Date   HGBA1C 5.5 10/01/2017  MPG 111.15 10/01/2017   MPG 105 01/08/2009   Lab Results  Component Value Date   PROLACTIN 19.0 10/01/2017   Lab Results  Component Value Date   CHOL 154 10/01/2017   TRIG 163 (H) 10/01/2017   HDL 44 10/01/2017   CHOLHDL 3.5 10/01/2017   VLDL 33 10/01/2017   LDLCALC 77 10/01/2017   LDLCALC  01/08/2009    41        Total Cholesterol/HDL:CHD Risk Coronary Heart Disease Risk Table                     Men   Women  1/2 Average Risk   3.4   3.3  Average Risk       5.0   4.4  2 X Average Risk   9.6   7.1  3 X Average Risk  23.4   11.0        Use the calculated Patient Ratio above and the CHD Risk Table to determine the patient's CHD Risk.        ATP III CLASSIFICATION (LDL):  <100     mg/dL   Optimal  161-096  mg/dL   Near or Above                    Optimal  130-159  mg/dL   Borderline  045-409  mg/dL   High  >811     mg/dL   Very High   Physical Findings: AIMS: Facial and Oral Movements Muscles of Facial Expression: None, normal Lips and Perioral Area: None, normal Jaw: None, normal Tongue: None, normal,Extremity Movements Upper (arms, wrists, hands, fingers): None, normal Lower (legs, knees, ankles, toes): None, normal, Trunk Movements Neck, shoulders, hips: None, normal, Overall Severity Severity of abnormal movements (highest score from questions above): None, normal Incapacitation due to abnormal movements: None, normal Patient's awareness of abnormal movements (rate only patient's report): No Awareness, Dental Status Current problems with teeth and/or dentures?: No Does patient usually wear dentures?: No  CIWA:    COWS:      Musculoskeletal: Strength & Muscle Tone: within normal limits Gait & Station: normal Patient leans: N/A  Psychiatric Specialty Exam: Physical Exam  Nursing note and vitals reviewed.   Review of Systems  Constitutional: Negative for chills and fever.  Respiratory: Negative for cough and shortness of breath.   Cardiovascular: Negative for chest pain.  Gastrointestinal: Negative for abdominal pain, heartburn, nausea and vomiting.  Psychiatric/Behavioral: Negative for depression, hallucinations and suicidal ideas. The patient is not nervous/anxious and does not have insomnia.     Blood pressure 97/77, pulse 93, temperature 97.9 F (36.6 C), temperature source Oral, resp. rate 18, height 5\' 2"  (1.575 m), weight 87.5 kg.Body mass index is 35.3 kg/m.  General Appearance: Casual and Fairly Groomed  Eye Contact:  Good  Speech:  Blocked and Clear and Coherent  Volume:  Normal  Mood:  Anxious, Depressed and Irritable  Affect:  Constricted, Depressed, Flat and Tearful  Thought Process:  Coherent, Disorganized, Goal Directed and Descriptions of Associations: Loose  Orientation:  Full (Time, Place, and Person)  Thought Content:  Delusions, Paranoid Ideation and Rumination  Suicidal Thoughts:  No  Homicidal Thoughts:  No  Memory:  Immediate;   Fair Recent;   Fair Remote;   Fair  Judgement:  Poor  Insight:  Lacking  Psychomotor Activity:  Normal  Concentration:  Concentration: Poor  Recall:  Poor  Fund of Knowledge:  Poor  Language:  Poor  Akathisia:  No  Handed:    AIMS (if indicated):     Assets:  Resilience Social Support  ADL's:  Intact  Cognition:  WNL  Sleep:  Number of Hours: 6   Treatment Plan Summary: Daily contact with patient to assess and evaluate symptoms and progress in treatment and Medication management   -Continue inpatient hospitalization.  -Will continue today 10/03/2017 plan as below except where it is noted.  -Bipolar current episode depressed, severe,  with psychosis   -Continue abilify 15mg  po qDay   -Continue cymbalta 20mg  po BID  -anxiety     -Continue vistaril 25mg  po q6h prn anxiety  -insomnia  - Continue trazodone 50mg  po qhs prn insomnia  -GERD   -Continue protonix 40mg  po qDay  -HLD   -Continue lipitor 20mg  po qDay  -COPD   -Continue albuterol 140mcg/act take 2 puffs q4h prn wheeze/SOB  -Encourage participation in groups and therapeutic milieu  -disposition planning will be ongoing  Armandina Stammer, NP, PMHNP, FNP-BC. 10/03/2017, 3:07 PMPatient ID: [name] Brickhouse-Tyler, female   DOB: 1960/05/24, 57 y.o.   MRN: [id number]

## 2017-10-03 NOTE — Progress Notes (Signed)
Nursing Progress Note: 7p-7a D: Pt currently presents with a anxious/irritable/improved affect and behavior. Interacting minimally with the milieu. Pt reports good sleep during the previous night with current medication regimen. Pt did attend wrap-up group.  A: Pt provided with medications per providers orders. Pt's labs and vitals were monitored throughout the night. Pt supported emotionally and encouraged to express concerns and questions. Pt educated on medications.  R: Pt's safety ensured with 15 minute and environmental checks. Pt currently denies SI, HI, and AVH. Pt verbally contracts to seek staff if SI,HI, or AVH occurs and to consult with staff before acting on any harmful thoughts. Will continue to monitor.

## 2017-10-03 NOTE — [facility] Group Notes (Signed)
[facility] Group Notes:  (Nursing/MHT/Case Management/Adjunct)  Date:  10/03/2017  Time:  3:43 PM  Type of Therapy:  Psychoeducational Skills  Participation Level:  Active  Participation Quality:  Appropriate  Affect:  Appropriate  Cognitive:  Appropriate  Insight:  Appropriate  Engagement in Group:  Engaged  Modes of Intervention:  Problem-solving  Summary of Progress/Problems:  Topic was on anger management.  Dantrell Schertzer O Keyoni Lapinski 10/03/2017, 3:43 PM   

## 2017-10-04 NOTE — Progress Notes (Signed)
Patient ID: Rebecca [name], female   DOB: Oct 13, 1960, 57 y.o.   MRN: [id number] PER STATE REGULATIONS 482.30  THIS CHART WAS REVIEWED FOR MEDICAL NECESSITY WITH RESPECT TO THE PATIENT'S ADMISSION/DURATION OF STAY.  NEXT REVIEW DATE:10/09/17  Loura HaltBARBARA Jolleen Seman, RN, BSN CASE MANAGER

## 2017-10-04 NOTE — Progress Notes (Signed)
Adult Psychoeducational Group Note  Date:  10/04/2017 Time:  9:06 PM  Group Topic/Focus:  Wrap-Up Group:   The focus of this group is to help patients review their daily goal of treatment and discuss progress on daily workbooks.  Participation Level:  Active  Participation Quality:  Appropriate  Affect:  Appropriate  Cognitive:  Appropriate  Insight: Appropriate  Engagement in Group:  Engaged  Modes of Intervention:  Discussion  Additional Comments:  The patient also said that she rates today a 8.The  Also said that  She attended group.  Octavio Mannshigpen, Om Lizotte Lee 10/04/2017, 9:06 PM

## 2017-10-04 NOTE — [facility] Group Notes (Signed)
[facility]  Date/Time:  10/04/2017  11:00AM-12:00PM  Type of Therapy and Topic:  Group Therapy:  Music and Mood  Participation Level:  Active   Description of Group: In this process group, members listened to a variety of genres of music and identified that different types of music evoke different responses.  Patients were encouraged to identify music that was soothing for them and music that was energizing for them.  Patients discussed how this knowledge can help with wellness and recovery in various ways including managing depression and anxiety as well as encouraging healthy sleep habits.    Therapeutic Goals: 1. Patients will explore the impact of different varieties of music on mood 2. Patients will verbalize the thoughts they have when listening to different types of music 3. Patients will identify music that is soothing to them as well as music that is energizing to them 4. Patients will discuss how to use this knowledge to assist in maintaining wellness and recovery 5. Patients will explore the use of music as a coping skill  Summary of Patient Progress:  At the beginning of group, patient expressed that she felt alright, and at the end of group she felt relaxed and peaceful.  She initially did not want to dance but one of the nursing students got her up on her feet and she responded well to encouragement from group members, smiled quite a bit, said she loves dancing.  Therapeutic Modalities: Solution Focused Brief Therapy Activity   Ambrose MantleMareida Grossman-Orr, LCSW

## 2017-10-04 NOTE — Progress Notes (Signed)
DAR NOTE: Pt present with flat affect and depressed mood in the unit. Pt has been  Irritable, confused, kept asking staff to call her daughter, complained of back pain and demanded for I pod to use for pain. I pod brought to the pain but was not working. Pt instructed staff to lock the I pod back in the locker.   Pt took all her  meds as scheduled. As per self inventory, pt had a fair night sleep, good appetite, normal energy, and good concentration. Pt rate depression at 0, hopeless ness at 0. Pt's safety ensured with 15 minute and environmental checks. Pt currently denies SI/HI and A/V hallucinations. Pt verbally agrees to seek staff if SI/HI or A/VH occurs and to consult with staff before acting on these thoughts. Will continue POC.

## 2017-10-04 NOTE — [facility] Group Notes (Signed)
[facility] Group Notes:  (Nursing/MHT/Case Management/Adjunct)  Date:  10/04/2017  Time:  9:20 AM  Type of Therapy:  Orientation/Goals group  Participation Level:  Did Not Attend  Participation Quality:  Did Not Attend  Affect:  Did Not Attend  Cognitive:  Did Not Attend  Insight:  None  Engagement in Group:  Did Not Attend  Modes of Intervention:  Did Not Attend  Summary of Progress/Problems: Pt did not attend patient self inventory group/orientation group.  Jacquelyne BalintForrest, Sharise Lippy Shanta 10/04/2017, 9:20 AM

## 2017-10-04 NOTE — Progress Notes (Signed)
[facility] MD Progress Note  10/04/2017 5:19 PM Rebecca [name]  MRN:  [id number]  Subjective: Rebecca [name] reports, "I'm fine. I'm not depressed. I got no complaints. Can I have my I-pad?  Rebecca [name] is a 57 y/o F with history of Bipolar I and ADHD who was admitted from [facility] on IVC initiated by her daughter due to worsening agitation, disorganized behaviors, throwing objects, ripping out her own hair, responding to internal stimuli, and medication non-adherence. Pt was flat with increased latency and poor ability to provide history in the ED. She was medically cleared and then transferred to [facility] for additional treatment and stabilization. She was restarted on previous medication of cymbalta and she was also started on trial of abilify.  Today upon evaluation, pt was asked how she is doing, and pt shares, "I'm doing alright. She continues to present with thought blocking to some degree & she also appears somewhat guarded. She denies SI/HI/AH/VH. She denies physical complaints. She is tolerating her medications without difficulty or side effects. She is very minimal with her responses. She is asking for an I-pad she says was her medical device she wears for her back pains. She agrees to continue her current regimen without changes. She had previously agreed to allow the treatment team to reach out to her daughter.Collateral information obtained via telephone from pt's daughter suggests that she has been decompensating for several weeks prior to admission. She has not been keeping up with her bills or rent and she is imminently being evicted from her apartment in [place], [place]. She also stole a dog prior to coming to the [place] area, and pt's daughter is in process of returning dog to the owner. Pt's daughter also reports previous episodes of mania and decompensation, for which pt was hospitalized and she has been off of psychotropic medications aside from anxiolytics since around  2012.  Principal Problem: Bipolar 1 disorder, depressed, severe (HCC) Diagnosis:   Patient Active Problem List   Diagnosis Date Noted  . Bipolar 1 disorder, depressed, severe (HCC) [F31.4] 09/30/2017  . Manic behavior (HCC) [F30.10]   . Affective psychosis, bipolar (HCC) [F31.9]    Total Time spent with patient: 15 minutes  Past Psychiatric History: See H&P  Past Medical History:  Past Medical History:  Diagnosis Date  . ADHD (attention deficit hyperactivity disorder)   . Back pain   . Carpal tunnel syndrome   . Chronic pain   . Diabetes mellitus   . Fibromyalgia   . GERD (gastroesophageal reflux disease)   . Mitral valve prolapse   . Mood disorder [facility])     Past Surgical History:  Procedure Laterality Date  . ABDOMINAL HYSTERECTOMY    . BACK SURGERY    . CARPAL TUNNEL RELEASE    . mood disorder    . neuro stimulator     Pt had neuro stiumlator implated in back   . SPINAL CORD STIMULATOR IMPLANT     Family History: History reviewed. No pertinent family history.  Family Psychiatric  History: See H&P  Social History:  Social History   Substance and Sexual Activity  Alcohol Use Yes   Comment:       Social History   Substance and Sexual Activity  Drug Use Yes  . Types: Marijuana    Social History   Socioeconomic History  . Marital status: Married    Spouse name: Not on file  . Number of children: Not on file  . Years of education: Not on file  .  Highest education level: Not on file  Occupational History  . Not on file  Social Needs  . Financial resource strain: Not on file  . Food insecurity:    Worry: Not on file    Inability: Not on file  . Transportation needs:    Medical: Not on file    Non-medical: Not on file  Tobacco Use  . Smoking status: Current Every Day Smoker    Packs/day: 1.00    Types: Cigarettes  . Smokeless tobacco: Never Used  Substance and Sexual Activity  . Alcohol use: Yes    Comment:    . Drug use: Yes    Types:  Marijuana  . Sexual activity: Not Currently  Lifestyle  . Physical activity:    Days per week: Not on file    Minutes per session: Not on file  . Stress: Not on file  Relationships  . Social connections:    Talks on phone: Not on file    Gets together: Not on file    Attends religious service: Not on file    Active member of club or organization: Not on file    Attends meetings of clubs or organizations: Not on file    Relationship status: Not on file  Other Topics Concern  . Not on file  Social History Narrative   ** Merged History Encounter **       Additional Social History:   Sleep: Good  Appetite:  Good  Current Medications: Current Facility-Administered Medications  Medication Dose Route Frequency Provider Last Rate Last Dose  . acetaminophen (TYLENOL) tablet 650 mg  650 mg Oral Q6H PRN Nira Conn A, NP   650 mg at 10/04/17 1415  . albuterol (PROVENTIL HFA;VENTOLIN HFA) 108 (90 Base) MCG/ACT inhaler 2 puff  2 puff Inhalation Q4H PRN Micheal Likens, MD      . alum & mag hydroxide-simeth (MAALOX/MYLANTA) 200-200-20 MG/5ML suspension 30 mL  30 mL Oral Q4H PRN Nira Conn A, NP      . ARIPiprazole (ABILIFY) tablet 15 mg  15 mg Oral Daily Micheal Likens, MD   15 mg at 10/04/17 0744  . atorvastatin (LIPITOR) tablet 20 mg  20 mg Oral Daily Nira Conn A, NP   20 mg at 10/04/17 0744  . DULoxetine (CYMBALTA) DR capsule 20 mg  20 mg Oral BID Nira Conn A, NP   20 mg at 10/04/17 1711  . hydrocortisone cream 1 %   Topical BID Money, Gerlene Burdock, FNP      . hydrOXYzine (ATARAX/VISTARIL) tablet 25 mg  25 mg Oral Q6H PRN Nira Conn A, NP   25 mg at 10/04/17 1416  . magnesium hydroxide (MILK OF MAGNESIA) suspension 30 mL  30 mL Oral Daily PRN Nira Conn A, NP      . nicotine (NICODERM CQ - dosed in mg/24 hours) patch 21 mg  21 mg Transdermal Daily Micheal Likens, MD   21 mg at 10/04/17 0745  . pantoprazole (PROTONIX) EC tablet 40 mg  40 mg Oral Daily  Nira Conn A, NP   40 mg at 10/04/17 0744  . traZODone (DESYREL) tablet 50 mg  50 mg Oral QHS PRN Nira Conn A, NP   50 mg at 10/03/17 2131   Lab Results:  No results found for this or any previous visit (from the past 48 hour(s)).  Blood Alcohol level:  Lab Results  Component Value Date   ETH <10 09/28/2017   ETH <11 09/07/2011  Metabolic Disorder Labs: Lab Results  Component Value Date   HGBA1C 5.5 10/01/2017   MPG 111.15 10/01/2017   MPG 105 01/08/2009   Lab Results  Component Value Date   PROLACTIN 19.0 10/01/2017   Lab Results  Component Value Date   CHOL 154 10/01/2017   TRIG 163 (H) 10/01/2017   HDL 44 10/01/2017   CHOLHDL 3.5 10/01/2017   VLDL 33 10/01/2017   LDLCALC 77 10/01/2017   LDLCALC  01/08/2009    41        Total Cholesterol/HDL:CHD Risk Coronary Heart Disease Risk Table                     Men   Women  1/2 Average Risk   3.4   3.3  Average Risk       5.0   4.4  2 X Average Risk   9.6   7.1  3 X Average Risk  23.4   11.0        Use the calculated Patient Ratio above and the CHD Risk Table to determine the patient's CHD Risk.        ATP III CLASSIFICATION (LDL):  <100     mg/dL   Optimal  161-096  mg/dL   Near or Above                    Optimal  130-159  mg/dL   Borderline  045-409  mg/dL   High  >811     mg/dL   Very High   Physical Findings: AIMS: Facial and Oral Movements Muscles of Facial Expression: None, normal Lips and Perioral Area: None, normal Jaw: None, normal Tongue: None, normal,Extremity Movements Upper (arms, wrists, hands, fingers): None, normal Lower (legs, knees, ankles, toes): None, normal, Trunk Movements Neck, shoulders, hips: None, normal, Overall Severity Severity of abnormal movements (highest score from questions above): None, normal Incapacitation due to abnormal movements: None, normal Patient's awareness of abnormal movements (rate only patient's report): No Awareness, Dental Status Current problems with  teeth and/or dentures?: No Does patient usually wear dentures?: No  CIWA:    COWS:     Musculoskeletal: Strength & Muscle Tone: within normal limits Gait & Station: normal Patient leans: N/A  Psychiatric Specialty Exam: Physical Exam  Nursing note and vitals reviewed.   Review of Systems  Constitutional: Negative for chills and fever.  Respiratory: Negative for cough and shortness of breath.   Cardiovascular: Negative for chest pain.  Gastrointestinal: Negative for abdominal pain, heartburn, nausea and vomiting.  Psychiatric/Behavioral: Negative for depression, hallucinations and suicidal ideas. The patient is not nervous/anxious and does not have insomnia.     Blood pressure 109/88, pulse (!) 101, temperature 98.1 F (36.7 C), resp. rate 16, height 5\' 2"  (1.575 m), weight 87.5 kg.Body mass index is 35.3 kg/m.  General Appearance: Casual and Fairly Groomed  Eye Contact:  Good  Speech:  Blocked and Clear and Coherent  Volume:  Normal  Mood:  Anxious, Depressed and Irritable  Affect:  Constricted, Depressed, Flat and Tearful  Thought Process:  Coherent, Disorganized, Goal Directed and Descriptions of Associations: Loose  Orientation:  Full (Time, Place, and Person)  Thought Content:  Delusions, Paranoid Ideation and Rumination  Suicidal Thoughts:  No  Homicidal Thoughts:  No  Memory:  Immediate;   Fair Recent;   Fair Remote;   Fair  Judgement:  Poor  Insight:  Lacking  Psychomotor Activity:  Normal  Concentration:  Concentration: Poor  Recall:  Poor  Fund of Knowledge:  Poor  Language:  Poor  Akathisia:  No  Handed:    AIMS (if indicated):     Assets:  Resilience Social Support  ADL's:  Intact  Cognition:  WNL  Sleep:  Number of Hours: 6.75   Treatment Plan Summary: Daily contact with patient to assess and evaluate symptoms and progress in treatment and Medication management   -Continue inpatient hospitalization.  -Will continue today 10/04/2017 plan as below  except where it is noted.  -Bipolar current episode depressed, severe, with psychosis   -Continue abilify 15mg  po qDay   -Continue cymbalta 20mg  po BID  -anxiety     -Continue vistaril 25mg  po q6h prn anxiety  -insomnia  - Continue trazodone 50mg  po qhs prn insomnia  -GERD   -Continue protonix 40mg  po qDay  -HLD   -Continue lipitor 20mg  po qDay  -COPD   -Continue albuterol 13608mcg/act take 2 puffs q4h prn wheeze/SOB  -Encourage participation in groups and therapeutic milieu  -disposition planning will be ongoing  Armandina StammerAgnes Nwoko, NP, PMHNP, FNP-BC. 10/04/2017, 5:19 PMPatient ID: Onnie BoerKathy Lynn [name], female   DOB: 03/22/1960, 57 y.o.   MRN: [id number]

## 2017-10-05 NOTE — Progress Notes (Signed)
Recreation Therapy Notes  Date: 9.16.19 Time: 1000 Location: 500 Hall Dayroom  Group Topic: Wellness  Goal Area(s) Addresses:  Patient will define components of whole wellness. Patient will verbalize benefit of whole wellness.  Behavioral Response: Engaged  Intervention:  Exercise, Music  Activity: Exercise.  LRT lead the group through a series of stretches.  Each group member would then lead the group in an exercise of their choice.  Patients and LRT went through a number of exercises and dance moves to get the heart pumping and blood flowing.  Education: Wellness, Building control surveyorDischarge Planning.   Education Outcome: Acknowledges education/In group clarification offered/Needs additional education.   Clinical Observations/Feedback:  Pt was quiet but seemed to be enjoying the activity.  Pt completed all of the stretches and did some of the exercises.  Pt even got up and danced to some of the songs.   Caroll RancherMarjette Naseem Varden, LRT/CTRS         Caroll RancherLindsay, Syona Wroblewski A 10/05/2017 12:37 PM

## 2017-10-05 NOTE — Progress Notes (Signed)
Patient ID: Rebecca BathKathy Lynn Brickhouse-Tyler, female   DOB: [birth year], 57 y.o.   MRN: [id number]  D: Patient sitting in dayroom with peers. Pt preoccupied about her daughter refusing to bring the charger for ipad for her nerve stimulator. Pt mood and affect appeared depressed and flat. Pt  denies  SI/HI/AVH.No behavioral issues noted.  A: Support and encouragement offered as needed.  Medications administered as prescribed.  R: Patient is safe and cooperative on unit. Will continue to monitor  for safety and stability.

## 2017-10-05 NOTE — [facility] Group Notes (Signed)
LCSW Group Therapy Note   10/05/2017 1:15pm   Type of Therapy and Topic:  Group Therapy:  Overcoming Obstacles   Participation Level:  None   Description of Group:    In this group patients will be encouraged to explore what they see as obstacles to their own wellness and recovery. They will be guided to discuss their thoughts, feelings, and behaviors related to these obstacles. The group will process together ways to cope with barriers, with attention given to specific choices patients can make. Each patient will be challenged to identify changes they are motivated to make in order to overcome their obstacles. This group will be process-oriented, with patients participating in exploration of their own experiences as well as giving and receiving support and challenge from other group members.   Therapeutic Goals: 1. Patient will identify personal and current obstacles as they relate to admission. 2. Patient will identify barriers that currently interfere with their wellness or overcoming obstacles.  3. Patient will identify feelings, thought process and behaviors related to these barriers. 4. Patient will identify two changes they are willing to make to overcome these obstacles:      Summary of Patient Progress   Stayed the entire time, engaged throughout.  However, stated she has no obstacles and was not willing to entertain the idea that she may have some and is just overlooking them,.   Therapeutic Modalities:   Cognitive Behavioral Therapy Solution Focused Therapy Motivational Interviewing Relapse Prevention Therapy  Ida RogueRodney B Dacota Devall, LCSW 10/05/2017 3:23 PM

## 2017-10-05 NOTE — Progress Notes (Signed)
[facility]  10/05/2017 4:34 PM Rebecca [name]  MRN:  [id number] Subjective:    Rebecca [name] is a 57 y/o F with history of Bipolar I and ADHD who was admitted from [facility] on IVC initiated by her daughter due to worsening agitation, disorganized behaviors, throwing objects, ripping out her own hair, responding to internal stimuli, and medication non-adherence. Pt was flat with increased latency and poor ability to provide history in the ED. [facility]. She was restarted on previous medication of cymbalta and she was also started on trial of abilify. She has been reporting incremental improvement of her presenting symptoms.  Today upon evaluation, pt shares, "I'm doing fine." She denies any specific concerns. She is sleeping well. Her appetite is good. She denies SI/HI/AH/VH. She remains somewhat irritable and guarded with her history. Her responses are short. Some of her responses are mildly tangential, such as when we were discussing [place] there, pt responded tangentially, "My daughter has my laptop." Pt is able to be redirected. She is in agreement to allow treatment team to coordinate discharge planning with her daughter. Pt [place] boyfriend. She agrees to continue her current regimen without changes.  Principal Problem: Bipolar 1 disorder, depressed, severe (HCC) Diagnosis:   Patient Active Problem List   Diagnosis Date Noted  . Bipolar 1 disorder, depressed, severe (HCC) [F31.4] 09/30/2017  . Manic behavior (HCC) [F30.10]   . Affective psychosis, bipolar (HCC) [F31.9]    Total Time spent with patient: 30 minutes  Past Psychiatric History: see H&P  Past Medical History:  Past Medical History:  Diagnosis Date  . ADHD (attention deficit  hyperactivity disorder)   . Back pain   . Carpal tunnel syndrome   . Chronic pain   . Diabetes mellitus   . Fibromyalgia   . GERD (gastroesophageal reflux disease)   . Mitral valve prolapse   . Mood disorder [facility](HCC)     Past Surgical History:  Procedure Laterality Date  . ABDOMINAL HYSTERECTOMY    . BACK SURGERY    . CARPAL TUNNEL RELEASE    . mood disorder    . neuro stimulator     Pt had neuro stiumlator implated in back   . SPINAL CORD STIMULATOR IMPLANT     Family History: History reviewed. No pertinent family history. Family Psychiatric  History: see H&P Social History:  Social History   Substance and Sexual Activity  Alcohol Use Yes   Comment:       Social History   Substance and Sexual Activity  Drug Use Yes  . Types: Marijuana    Social History   Socioeconomic History  . Marital status: Married    Spouse name: Not on file  . Number of children: Not on file  . Years of education: Not on file  . Highest education level: Not on file  Occupational History  . Not on file  Social Needs  . Financial resource strain: Not on file  . Food insecurity:    Worry: Not on file    Inability: Not on file  . Transportation needs:    Medical: Not on file    Non-medical: Not on file  Tobacco Use  . Smoking status: Current Every Day Smoker    Packs/day:  1.00    Types: Cigarettes  . Smokeless tobacco: Never Used  Substance and Sexual Activity  . Alcohol use: Yes    Comment:    . Drug use: Yes    Types: Marijuana  . Sexual activity: Not Currently  Lifestyle  . Physical activity:    Days per week: Not on file    Minutes per session: Not on file  . Stress: Not on file  Relationships  . Social connections:    Talks on phone: Not on file    Gets together: Not on file    Attends religious service: Not on file    Active member of club or organization: Not on file    Attends meetings of clubs or organizations: Not on file    Relationship status: Not on file  Other  Topics Concern  . Not on file  Social History Narrative   ** Merged History Encounter **       Additional Social History:                         Sleep: Good  Appetite:  Good  Current Medications: Current Facility-Administered Medications  Medication Dose Route Frequency Provider Last Rate Last Dose  . acetaminophen (TYLENOL) tablet 650 mg  650 mg Oral Q6H PRN Nira Conn A, NP   650 mg at 10/04/17 1415  . albuterol (PROVENTIL HFA;VENTOLIN HFA) 108 (90 Base) MCG/ACT inhaler 2 puff  2 puff Inhalation Q4H PRN Micheal Likens, MD      . alum & mag hydroxide-simeth (MAALOX/MYLANTA) 200-200-20 MG/5ML suspension 30 mL  30 mL Oral Q4H PRN Nira Conn A, NP      . ARIPiprazole (ABILIFY) tablet 15 mg  15 mg Oral Daily Micheal Likens, MD   15 mg at 10/05/17 0747  . atorvastatin (LIPITOR) tablet 20 mg  20 mg Oral Daily Nira Conn A, NP   20 mg at 10/05/17 0747  . DULoxetine (CYMBALTA) DR capsule 20 mg  20 mg Oral BID Nira Conn A, NP   20 mg at 10/05/17 0747  . hydrocortisone cream 1 %   Topical BID Money, Gerlene Burdock, FNP      . hydrOXYzine (ATARAX/VISTARIL) tablet 25 mg  25 mg Oral Q6H PRN Nira Conn A, NP   25 mg at 10/04/17 1416  . magnesium hydroxide (MILK OF MAGNESIA) suspension 30 mL  30 mL Oral Daily PRN Nira Conn A, NP      . nicotine (NICODERM CQ - dosed in mg/24 hours) patch 21 mg  21 mg Transdermal Daily Micheal Likens, MD   21 mg at 10/05/17 0747  . pantoprazole (PROTONIX) EC tablet 40 mg  40 mg Oral Daily Nira Conn A, NP   40 mg at 10/05/17 0747  . traZODone (DESYREL) tablet 50 mg  50 mg Oral QHS PRN Jackelyn Poling, NP   50 mg at 10/04/17 2146    Lab Results: No results found for this or any previous visit (from the past 48 hour(s)).  Blood Alcohol level:  Lab Results  Component Value Date   [facility] <10 09/28/2017   ETH <11 09/07/2011    Metabolic Disorder Labs: Lab Results  Component Value Date   HGBA1C 5.5 10/01/2017   MPG  111.15 10/01/2017   MPG 105 01/08/2009   Lab Results  Component Value Date   PROLACTIN 19.0 10/01/2017   Lab Results  Component Value Date   CHOL 154 10/01/2017  TRIG 163 (H) 10/01/2017   HDL 44 10/01/2017   CHOLHDL 3.5 10/01/2017   VLDL 33 10/01/2017   LDLCALC 77 10/01/2017   LDLCALC  01/08/2009    41        Total Cholesterol/HDL:CHD Risk Coronary Heart Disease Risk Table                     Men   Women  1/2 Average Risk   3.4   3.3  Average Risk       5.0   4.4  2 X Average Risk   9.6   7.1  3 X Average Risk  23.4   11.0        Use the calculated Patient Ratio above and the CHD Risk Table to determine the patient's CHD Risk.        ATP III CLASSIFICATION (LDL):  <100     mg/dL   Optimal  098-119  mg/dL   Near or Above                    Optimal  130-159  mg/dL   Borderline  147-829  mg/dL   High  >562     mg/dL   Very High    Physical Findings: AIMS: Facial and Oral Movements Muscles of Facial Expression: None, normal Lips and Perioral Area: None, normal Jaw: None, normal Tongue: None, normal,Extremity Movements Upper (arms, wrists, hands, fingers): None, normal Lower (legs, knees, ankles, toes): None, normal, Trunk Movements Neck, shoulders, hips: None, normal, Overall Severity Severity of abnormal movements (highest score from questions above): None, normal Incapacitation due to abnormal movements: None, normal Patient's awareness of abnormal movements (rate only patient's report): No Awareness, Dental Status Current problems with teeth and/or dentures?: No Does patient usually wear dentures?: No  CIWA:    COWS:     Musculoskeletal: Strength & Muscle Tone: within normal limits Gait & Station: normal Patient leans: N/A  Psychiatric Specialty Exam: Physical Exam  Nursing note and vitals reviewed.   Review of Systems  Constitutional: Negative for chills and fever.  Respiratory: Negative for cough and shortness of breath.   Cardiovascular: Negative  for chest pain.  Gastrointestinal: Negative for abdominal pain, heartburn, nausea and vomiting.  Psychiatric/Behavioral: Negative for depression, hallucinations and suicidal ideas. The patient is not nervous/anxious and does not have insomnia.     Blood pressure 109/88, pulse (!) 101, temperature 98.1 F (36.7 C), resp. rate 16, height 5\' 2"  (1.575 m), weight 87.5 kg.Body mass index is 35.3 kg/m.  General Appearance: Casual and Fairly Groomed  Eye Contact:  Good  Speech:  Clear and Coherent and Normal Rate  Volume:  Normal  Mood:  Anxious and Irritable  Affect:  Appropriate and Congruent  Thought Process:  Coherent and Goal Directed  Orientation:  Full (Time, Place, and Person)  Thought Content:  Tangential  Suicidal Thoughts:  No  Homicidal Thoughts:  No  Memory:  Immediate;   Fair Recent;   Fair Remote;   Fair  Judgement:  Poor  Insight:  Lacking  Psychomotor Activity:  Normal  Concentration:  Concentration: Fair  Recall:  Fair  Fund of Knowledge:  Good  Language:  Fair  Akathisia:  No  Handed:    AIMS (if indicated):     Assets:  Resilience Social Support  ADL's:  Intact  Cognition:  WNL  Sleep:  Number of Hours: 6.75    Treatment Plan Summary: Daily contact with patient to assess  and evaluate symptoms and progress in treatment and Medication management   -Continue inpatient hospitalization  -Bipolar current episode depressed, severe, with psychosis             -Continue abilify 15mg  po qDay              -Continue cymbalta 20mg  po BID  -anxiety                        -Continue vistaril 25mg  po q6h prn anxiety  -insomnia             - Continue trazodone 50mg  po qhs prn insomnia  -GERD             -Continue protonix 40mg  po qDay  -HLD              -Continue lipitor 20mg  po qDay  -COPD             -Continue albuterol 169mcg/act take 2 puffs q4h prn wheeze/SOB  -Encourage participation in groups and therapeutic milieu  -disposition planning will  be ongoing  Micheal Likens, MD 10/05/2017, 4:34 PM

## 2017-10-05 NOTE — Progress Notes (Signed)
Patient denies SI, HI and AVH.  Patient has been compliant with medications and attended groups.  Patient is paranoid and noted to be tearful after a conversation with daughter.  Patient has been calling daughter to ask her to come and get her.    Assess patient for safety, offer medications as prescribed, engage patient in 1:1 staff talk.s   Patient able to contract for safety.  Continue to monitor as planned.

## 2017-10-05 NOTE — Progress Notes (Addendum)
Patient ID: Rebecca [name], female   DOB: 04/21/1960, 57 y.o.   MRN: [id number]  D: Rebecca [name] has a flat angry affect on approach tonight. Asking "Did you call my daughter?". [name] paranoid tonight. She then asked me to call her daughter but would not give me a reason to call her. I told the patient that her daughter can call here any time she wants if she wants to speak with nursing. Patient has a hard time understanding simple concepts tonight and can be demanding at times. She reports no issues with her mood and denies any SI/HI. She did report she cursed her daughter out on the phone earlier.  A: Staff will monitor on q 15 minute checks, follow treatment plan, and give medications as ordered. R: Patient took sleep medication but spit before confirming that it was a "generic" drug. She then took it once confirmed that it was a generic medication. Patient paranoid with a lot of concrete thinking.

## 2017-10-06 MED ORDER — IBUPROFEN 800 MG PO TABS
800.0000 mg | ORAL_TABLET | Freq: Once | ORAL | Status: AC
  Administered 2017-10-06: 800 mg via ORAL
  Filled 2017-10-06 (×2): qty 1

## 2017-10-06 NOTE — Progress Notes (Signed)
Patient has been irritable and anxious.  Patient denies SI, HI and AVH this shift, but remains paranoid and fixated on if her medications as generic or not stating she only takes generic medications.    Assess patient for safety, offer medications as prescribed, and engage patient in 1:1 staff talks.   Patient able to contract for safety.  Continue to monitor as planned.

## 2017-10-06 NOTE — [facility] Group Notes (Signed)
LCSW Group Therapy Note   10/06/2017 1:15pm   Type of Therapy and Topic:  Group Therapy:  Positive Affirmations   Participation Level:  Minimal  Description of Group: This group addressed positive affirmation toward self and others. Patients went around the room and identified two positive things about themselves and two positive things about a peer in the room. Patients reflected on how it felt to share something positive with others, to identify positive things about themselves, and to hear positive things from others. Patients were encouraged to have a daily reflection of positive characteristics or circumstances.  Therapeutic Goals 1. Patient will verbalize two of their positive qualities 2. Patient will demonstrate empathy for others by stating two positive qualities about a peer in the group 3. Patient will verbalize their feelings when voicing positive self affirmations and when voicing positive affirmations of others 4. Patients will discuss the potential positive impact on their wellness/recovery of focusing on positive traits of self and others. Summary of Patient Progress: Engaged throughout.  Reluctantly talked about her mother's death and began sobbing. Left the room, but returned after she dried her eyes and composed herself.  Talked about how her siblings all fought over the belongings after mother died, and all she got was the white bible, and how important that is to her.  Therapeutic Modalities Cognitive Behavioral Therapy Motivational Interviewing  Rebecca RogueRodney B Malene Blaydes, [place]CS[place] 10/06/2017 3:12 PM

## 2017-10-06 NOTE — Progress Notes (Signed)
Nursing Progress Note: 7p-7a D: Pt currently presents with a concrete/anxious affect and behavior. Pt states "I had a pretty good day. I talked to a friend and my daughter and that was nice." Interacting currently with the milieu. Pt reports good sleep during the previous night with current medication regimen. Pt did attend wrap-up group.  A: Pt provided with medications per providers orders. Pt's labs and vitals were monitored throughout the night. Pt supported emotionally and encouraged to express concerns and questions. Pt educated on medications.  R: Pt's safety ensured with 15 minute and environmental checks. Pt currently denies SI, HI, and AVH. Pt verbally contracts to seek staff if SI,HI, or AVH occurs and to consult with staff before acting on any harmful thoughts. Will continue to monitor.

## 2017-10-06 NOTE — [facility] Group Notes (Signed)
Patient attended orientation and goals group. 

## 2017-10-06 NOTE — Progress Notes (Signed)
Recreation Therapy Notes  Date: 9.17.19 Time: 1000 Location: 500 Hall Dayroom  Group Topic: Leisure Education  Goal Area(s) Addresses:  Patient will identify positive leisure activities.  Patient will identify one positive benefit of participation in leisure activities.   Behavioral Response: Engaged  Intervention: AT[employer]White board, dry erase markers, various words  Activity: Leisure IT trainerictionary.  One patient would come to the board and get a slip of paper from the container.  The patient would draw the activity named on the paper onto the white board.  The patient that makes the correct guess, gets the next turn.  SN:  If the group is large enough, the group can be broken up into teams.  Education:  Leisure Education, Building control surveyorDischarge Planning  Education Outcome: Acknowledges education/In group clarification offered/Needs additional education  Clinical Observations/Feedback: Pt was quiet but intrigued with the activity.  Pt seemed to need a minute to understand the concept of the activity but once she saw a few of her peers go, she was able to understand and became fully involved.  Pt was pleasant throughout.    Caroll RancherMarjette Jasha Hodzic, LRT/CTRS         Caroll RancherLindsay, Adelee Hannula A 10/06/2017 11:52 AM

## 2017-10-06 NOTE — [facility] Suicide Risk Assessment (Signed)
[facility] INPATIENT:  Family/Significant Other Suicide Prevention Education  Suicide Prevention Education:  Education Completed; No one has been identified by the patient as the family member/significant other with whom the patient will be residing, and identified as the person(s) who will aid the patient in the event of a mental health crisis (suicidal ideations/suicide attempt).  With written consent from the patient, the family member/significant other has been provided the following suicide prevention education, prior to the and/or following the discharge of the patient.  The suicide prevention education provided includes the following:  Suicide risk factors  Suicide prevention and interventions  National Suicide Hotline telephone number  [facility] assessment telephone number  [facility] Emergency Assistance 911  [facility] and/or Residential Mobile Crisis Unit telephone number  Request made of family/significant other to:  Remove weapons (e.g., guns, rifles, knives), all items previously/currently identified as safety concern.    Remove drugs/medications (over-the-counter, prescriptions, illicit drugs), all items previously/currently identified as a safety concern.  The family member/significant other verbalizes understanding of the suicide prevention education information provided.  The family member/significant other agrees to remove the items of safety concern listed above. The patient did not endorse SI at the time of admission, nor did the patient c/o SI during the stay here.  SPE not required.  Rebecca RogueRodney [name] Rebecca [name] 10/06/2017, 5:36 PM

## 2017-10-06 NOTE — Progress Notes (Signed)
[facility]  10/06/2017 4:06 PM Onnie BoerKathy Lynn [name]  MRN:  [id number] Subjective:    Rebecca NetKathy [name] is a 57 y/o F with history of Bipolar I and ADHD who was admitted from [facility] on IVC initiated by her daughter due to worsening agitation, disorganized behaviors, throwing objects, ripping out her own hair, responding to internal stimuli, and medication non-adherence. Pt was flat with increased latency and poor ability to provide history in the ED. She was medically cleared and [facility] and [facility].She was restarted on previous medication of cymbalta and she was also started on trial of abilify. She has been reporting incremental improvement of her presenting symptoms.  Today upon evaluation, pt shares, "I'm fine." She denies any specific concerns aside from seeking discharge. She is sleeping well. Her appetite is good. She denies SI/HI/AH/VH. She is tolerating her medications well, and she is in agreement to continue her current regimen without changes. SW team called pt's daughter with pt present, and we discussed about discharge planning. [place] discussed about potential scenario that she is evicted, and pt states she will stay with a friend or be able to find an alternative arrangement on her own. She states that she will stay in her car as a backup, and we discussed about the dangers of that situation, and how [place] [facility] or with her daughter may be a safer choice. Pt declines this suggestion and declines offer of referral to group home, halfway house, or boarding homes. MOCA was administered and pt scored 16/30 with points off for 0/5 recal all and 0/3 for serial 7's. However her clock draw was appropriate, and her score may reflect some poor effort and symptoms of pseudodementia rather than a true cognitive impairment. We will begin process of discharge planning with  patient and her daughter for tomorrow.  Principal Problem: Bipolar 1 disorder, depressed, severe (HCC) Diagnosis:   Patient Active Problem List   Diagnosis Date Noted  . Bipolar 1 disorder, depressed, severe (HCC) [F31.4] 09/30/2017  . Manic behavior (HCC) [F30.10]   . Affective psychosis, bipolar (HCC) [F31.9]    Total Time spent with patient: 30 minutes  Past Psychiatric History: see H&P  Past Medical History:  Past Medical History:  Diagnosis Date  . ADHD (attention deficit hyperactivity disorder)   . Back pain   . Carpal tunnel syndrome   . Chronic pain   . Diabetes mellitus   . Fibromyalgia   . GERD (gastroesophageal reflux disease)   . Mitral valve prolapse   . Mood disorder [facility](HCC)     Past Surgical History:  Procedure Laterality Date  . ABDOMINAL HYSTERECTOMY    . BACK SURGERY    . CARPAL TUNNEL RELEASE    . mood disorder    . neuro stimulator     Pt had neuro stiumlator implated in back   . SPINAL CORD STIMULATOR IMPLANT     Family History: History reviewed. No pertinent family history. Family Psychiatric  History: see H&P Social History:  Social History   Substance and Sexual Activity  Alcohol Use Yes   Comment:       Social History   Substance and Sexual Activity  Drug Use Yes  . Types: Marijuana    Social History   Socioeconomic History  . Marital status: Married    Spouse name: Not on file  . Number of children: Not on file  .  Years of education: Not on file  . Highest education level: Not on file  Occupational History  . Not on file  Social Needs  . Financial resource strain: Not on file  . Food insecurity:    Worry: Not on file    Inability: Not on file  . Transportation needs:    Medical: Not on file    Non-medical: Not on file  Tobacco Use  . Smoking status: Current Every Day Smoker    Packs/day: 1.00    Types: Cigarettes  . Smokeless tobacco: Never Used  Substance and Sexual Activity  . Alcohol use: Yes    Comment:    .  Drug use: Yes    Types: Marijuana  . Sexual activity: Not Currently  Lifestyle  . Physical activity:    Days per week: Not on file    Minutes per session: Not on file  . Stress: Not on file  Relationships  . Social connections:    Talks on phone: Not on file    Gets together: Not on file    Attends religious service: Not on file    Active member of club or organization: Not on file    Attends meetings of clubs or organizations: Not on file    Relationship status: Not on file  Other Topics Concern  . Not on file  Social History Narrative   ** Merged History Encounter **       Additional Social History:                         Sleep: Good  Appetite:  Good  Current Medications: Current Facility-Administered Medications  Medication Dose Route Frequency Provider Last Rate Last Dose  . acetaminophen (TYLENOL) tablet 650 mg  650 mg Oral Q6H PRN Nira Conn A, NP   650 mg at 10/04/17 1415  . albuterol (PROVENTIL HFA;VENTOLIN HFA) 108 (90 Base) MCG/ACT inhaler 2 puff  2 puff Inhalation Q4H PRN Micheal Likens, MD      . alum & mag hydroxide-simeth (MAALOX/MYLANTA) 200-200-20 MG/5ML suspension 30 mL  30 mL Oral Q4H PRN Nira Conn A, NP      . ARIPiprazole (ABILIFY) tablet 15 mg  15 mg Oral Daily Micheal Likens, MD   15 mg at 10/06/17 0814  . atorvastatin (LIPITOR) tablet 20 mg  20 mg Oral Daily Nira Conn A, NP   20 mg at 10/06/17 0813  . DULoxetine (CYMBALTA) DR capsule 20 mg  20 mg Oral BID Nira Conn A, NP   20 mg at 10/06/17 0813  . hydrocortisone cream 1 %   Topical BID Money, Gerlene Burdock, FNP      . hydrOXYzine (ATARAX/VISTARIL) tablet 25 mg  25 mg Oral Q6H PRN Nira Conn A, NP   25 mg at 10/04/17 1416  . magnesium hydroxide (MILK OF MAGNESIA) suspension 30 mL  30 mL Oral Daily PRN Nira Conn A, NP      . nicotine (NICODERM CQ - dosed in mg/24 hours) patch 21 mg  21 mg Transdermal Daily Micheal Likens, MD   21 mg at 10/06/17 0814  .  pantoprazole (PROTONIX) EC tablet 40 mg  40 mg Oral Daily Nira Conn A, NP   40 mg at 10/06/17 0814  . traZODone (DESYREL) tablet 50 mg  50 mg Oral QHS PRN Nira Conn A, NP   50 mg at 10/06/17 0007    Lab Results: No results found for this or  any previous visit (from the past 48 hour(s)).  Blood Alcohol level:  Lab Results  Component Value Date   ETH <10 09/28/2017   ETH <11 09/07/2011    Metabolic Disorder Labs: Lab Results  Component Value Date   HGBA1C 5.5 10/01/2017   MPG 111.15 10/01/2017   MPG 105 01/08/2009   Lab Results  Component Value Date   PROLACTIN 19.0 10/01/2017   Lab Results  Component Value Date   CHOL 154 10/01/2017   TRIG 163 (H) 10/01/2017   HDL 44 10/01/2017   CHOLHDL 3.5 10/01/2017   VLDL 33 10/01/2017   LDLCALC 77 10/01/2017   LDLCALC  01/08/2009    41        Total Cholesterol/HDL:CHD Risk Coronary Heart Disease Risk Table                     Men   Women  1/2 Average Risk   3.4   3.3  Average Risk       5.0   4.4  2 X Average Risk   9.6   7.1  3 X Average Risk  23.4   11.0        Use the calculated Patient Ratio above and the CHD Risk Table to determine the patient's CHD Risk.        ATP III CLASSIFICATION (LDL):  <100     mg/dL   Optimal  161-096  mg/dL   Near or Above                    Optimal  130-159  mg/dL   Borderline  045-409  mg/dL   High  >811     mg/dL   Very High    Physical Findings: AIMS: Facial and Oral Movements Muscles of Facial Expression: None, normal Lips and Perioral Area: None, normal Jaw: None, normal Tongue: None, normal,Extremity Movements Upper (arms, wrists, hands, fingers): None, normal Lower (legs, knees, ankles, toes): None, normal, Trunk Movements Neck, shoulders, hips: None, normal, Overall Severity Severity of abnormal movements (highest score from questions above): None, normal Incapacitation due to abnormal movements: None, normal Patient's awareness of abnormal movements (rate only  patient's report): No Awareness, Dental Status Current problems with teeth and/or dentures?: No Does patient usually wear dentures?: No  CIWA:    COWS:     Musculoskeletal: Strength & Muscle Tone: within normal limits Gait & Station: normal Patient leans: N/A  Psychiatric Specialty Exam: Physical Exam  Nursing note and vitals reviewed.   Review of Systems  Constitutional: Negative for chills and fever.  Respiratory: Negative for cough and shortness of breath.   Cardiovascular: Negative for chest pain.  Gastrointestinal: Negative for abdominal pain, heartburn, nausea and vomiting.  Psychiatric/Behavioral: Positive for depression. Negative for hallucinations and suicidal ideas. The patient is not nervous/anxious and does not have insomnia.     Blood pressure (!) 114/99, pulse 97, temperature 97.8 F (36.6 C), temperature source Oral, resp. rate 18, height 5\' 2"  (1.575 m), weight 87.5 kg.Body mass index is 35.3 kg/m.  General Appearance: Casual and Fairly Groomed  Eye Contact:  Good  Speech:  Clear and Coherent and Normal Rate  Volume:  Normal  Mood:  Depressed  Affect:  Congruent, Constricted and Depressed  Thought Process:  Coherent and Goal Directed  Orientation:  Full (Time, Place, and Person)  Thought Content:  Logical  Suicidal Thoughts:  No  Homicidal Thoughts:  No  Memory:  Immediate;   Fair  Recent;   Fair Remote;   Fair  Judgement:  Poor  Insight:  Lacking  Psychomotor Activity:  Normal  Concentration:  Concentration: Fair  Recall:  [employer] of Knowledge:  Fair  Language:  Fair  Akathisia:  No  Handed:    AIMS (if indicated):     Assets:  Resilience Social Support  ADL's:  Intact  Cognition:  WNL  Sleep:  Number of Hours: 6.5    Treatment Plan Summary: Daily contact with patient to assess and evaluate symptoms and progress in treatment and Medication management   -Continue inpatient hospitalization  -Bipolar current episode depressed, severe, with  psychosis -Continue abilify 15mg  po qDay -Continue cymbalta 20mg  po BID  -anxiety -Continue vistaril 25mg  po q6h prn anxiety  -insomnia - Continue trazodone 50mg  po qhs prn insomnia  -GERD -Continue protonix 40mg  po qDay  -HLD -Continue lipitor 20mg  po qDay  -COPD -Continue albuterol 136mcg/act take 2 puffs q4h prn wheeze/SOB  -Encourage participation in groups and therapeutic milieu  -disposition planning will be ongoing  Micheal Likens, MD 10/06/2017, 4:06 PM

## 2017-10-06 NOTE — Progress Notes (Signed)
Patient shared with the group that she had a good day and that she had a good talk with her daughter. Her positive day could be attributed to her going outside as well. Her goal for tomorrow is to go home.

## 2017-10-07 MED ORDER — ARIPIPRAZOLE 15 MG PO TABS: 15.0000 mg | ORAL_TABLET | Freq: Every day | ORAL | 0 refills | Status: DC

## 2017-10-07 MED ORDER — NICOTINE 21 MG/24HR TD PT24: 21.0000 mg | MEDICATED_PATCH | Freq: Every day | TRANSDERMAL | 0 refills | Status: DC

## 2017-10-07 MED ORDER — DULOXETINE HCL 20 MG PO CPEP: 20.0000 mg | ORAL_CAPSULE | Freq: Two times a day (BID) | ORAL | 0 refills | Status: DC

## 2017-10-07 MED ORDER — HYDROXYZINE HCL 25 MG PO TABS: 25.0000 mg | ORAL_TABLET | Freq: Four times a day (QID) | ORAL | 0 refills | Status: AC | PRN

## 2017-10-07 MED ORDER — ALBUTEROL SULFATE HFA 108 (90 BASE) MCG/ACT IN AERS: 2.0000 | INHALATION_SPRAY | RESPIRATORY_TRACT | Status: AC | PRN

## 2017-10-07 MED ORDER — ATORVASTATIN CALCIUM 20 MG PO TABS: 20.0000 mg | ORAL_TABLET | Freq: Every day | ORAL | 0 refills | Status: DC

## 2017-10-07 MED ORDER — PANTOPRAZOLE SODIUM 40 MG PO TBEC: 40.0000 mg | DELAYED_RELEASE_TABLET | Freq: Every day | ORAL | 0 refills | Status: DC

## 2017-10-07 MED ORDER — HYDROCORTISONE 1 % EX CREA: TOPICAL_CREAM | Freq: Two times a day (BID) | CUTANEOUS | 0 refills | Status: DC

## 2017-10-07 MED ORDER — TRAZODONE HCL 50 MG PO TABS: 50.0000 mg | ORAL_TABLET | Freq: Every evening | ORAL | 0 refills | Status: DC | PRN

## 2017-10-07 NOTE — Discharge Summary (Addendum)
Physician Discharge Summary Note  Patient:  Rebecca [name] is an 57 y.o., female  MRN:  [id number]  DOB:  1960-02-21  Patient phone:  [phone] (home)   Patient address:   [name] [place] [address],   Total Time spent with patient: Greater than 30 minutes  Date of Admission:  09/30/2017  Date of Discharge: 10-07-17  Reason for Admission: Worsening agitation, disorganized behaviors, throwing objects, ripping out her own hair, responding to internal stimuli, and medication non-adherence.  Principal Problem: Bipolar 1 disorder, depressed, severe (HCC)  Discharge Diagnoses: Patient Active Problem List   Diagnosis Date Noted  . Bipolar 1 disorder, depressed, severe (HCC) [F31.4] 09/30/2017  . Manic behavior (HCC) [F30.10]   . Affective psychosis, bipolar (HCC) [F31.9]    Past Psychiatric History: Bipolar disorder.  Past Medical History:  Past Medical History:  Diagnosis Date  . ADHD (attention deficit hyperactivity disorder)   . Back pain   . Carpal tunnel syndrome   . Chronic pain   . Diabetes mellitus   . Fibromyalgia   . GERD (gastroesophageal reflux disease)   . Mitral valve prolapse   . Mood disorder [facility])     Past Surgical History:  Procedure Laterality Date  . ABDOMINAL HYSTERECTOMY    . BACK SURGERY    . CARPAL TUNNEL RELEASE    . mood disorder    . neuro stimulator     Pt had neuro stiumlator implated in back   . SPINAL CORD STIMULATOR IMPLANT     Family History: History reviewed. No pertinent family history.  Family Psychiatric  History: See H&P  Social History:  Social History   Substance and Sexual Activity  Alcohol Use Yes   Comment:       Social History   Substance and Sexual Activity  Drug Use Yes  . Types: Marijuana    Social History   Socioeconomic History  . Marital status: Married    Spouse name: Not on file  . Number of children: Not on file  . Years of education: Not on file  . Highest  education level: Not on file  Occupational History  . Not on file  Social Needs  . Financial resource strain: Not on file  . Food insecurity:    Worry: Not on file    Inability: Not on file  . Transportation needs:    Medical: Not on file    Non-medical: Not on file  Tobacco Use  . Smoking status: Current Every Day Smoker    Packs/day: 1.00    Types: Cigarettes  . Smokeless tobacco: Never Used  Substance and Sexual Activity  . Alcohol use: Yes    Comment:    . Drug use: Yes    Types: Marijuana  . Sexual activity: Not Currently  Lifestyle  . Physical activity:    Days per week: Not on file    Minutes per session: Not on file  . Stress: Not on file  Relationships  . Social connections:    Talks on phone: Not on file    Gets together: Not on file    Attends religious service: Not on file    Active member of club or organization: Not on file    Attends meetings of clubs or organizations: Not on file    Relationship status: Not on file  Other Topics Concern  . Not on file  Social History Narrative   ** Merged History Encounter **  Hospital Course: (Per Md's discharge SRA): [name] is a 57 y/o F with history of Bipolar I and ADHD who was admitted from [facility] on IVC initiated by her daughter due to worsening agitation, disorganized behaviors, throwing objects, ripping out her own hair, responding to internal stimuli, and medication non-adherence. Pt was flat with increased latency and poor ability to provide history in the ED. She was medically cleared and then [facility] and [facility].She was restarted on previous medication of cymbalta and she was also started on trial of abilify.She has been reporting incremental improvement of her presenting symptoms.  Today upon evaluation,pt shares, "I'm fine." She denies any specific concerns aside from seeking discharge. She is sleeping well. Her appetite is good. She denies  SI/HI/AH/VH. She is tolerating her medications well, and she is in agreement to continue her current regimen without changes. [place] and if she is not able to stay there she plans to stay with a friend. She [place] in [place]. She verbalizes good awareness of her legal situation. She was able to engage in safety planning including plan to [facility] or contact emergency services if she feels unable to maintain her own safety or the safety of others. Pt had no further questions, comments, or concerns.  Plan Of Care/Follow-up recommendations:   -Discharge to outpatient level of care  -Bipolar current episode depressed, severe, with psychosis -Continue abilify 15mg  po qDay -Continue cymbalta 20mg  po BID  -anxiety -Continue vistaril 25mg  po q6h prn anxiety  -insomnia - Continue trazodone 50mg  po qhs prn insomnia  -GERD -Continue protonix 40mg  po qDay  -HLD -Continue lipitor 20mg  po qDay  -COPD -Continue albuterol 15108mcg/act take 2 puffs q4h prn wheeze/SOB  Activity:  as tolerated Diet:  normal Tests:  NA Other:  see above for DC plan  Physical Findings: AIMS: Facial and Oral Movements Muscles of Facial Expression: None, normal Lips and Perioral Area: None, normal Jaw: None, normal Tongue: None, normal,Extremity Movements Upper (arms, wrists, hands, fingers): None, normal Lower (legs, knees, ankles, toes): None, normal, Trunk Movements Neck, shoulders, hips: None, normal, Overall Severity Severity of abnormal movements (highest score from questions above): None, normal Incapacitation due to abnormal movements: None, normal Patient's awareness of abnormal movements (rate only patient's report): No Awareness, Dental Status Current problems with teeth and/or dentures?: No Does patient  usually wear dentures?: No  CIWA:    COWS:     Musculoskeletal: Strength & Muscle Tone: within normal limits Gait & Station: normal Patient leans: N/A  Psychiatric Specialty Exam: Physical Exam  Nursing note and vitals reviewed. Constitutional: She appears well-developed.  HENT:  Head: Normocephalic.  Eyes: Pupils are equal, round, and reactive to light.  Neck: Normal range of motion.  Cardiovascular: Normal rate.  Respiratory: Effort normal.  GI: Soft.  Genitourinary:  Genitourinary Comments: Deferred  Musculoskeletal: Normal range of motion.  Neurological: She is alert.  Skin: Skin is warm.    Review of Systems  Constitutional: Negative.   HENT: Negative.   Eyes: Negative.   Respiratory: Negative.  Negative for cough and shortness of breath.   Cardiovascular: Negative.  Negative for chest pain and palpitations.  Gastrointestinal: Negative.   Genitourinary: Negative.   Musculoskeletal: Negative.   Skin: Negative.   Neurological: Negative.   Endo/Heme/Allergies: Negative.   Psychiatric/Behavioral: Positive for depression (Stable), hallucinations ( Hx. psychosis ) and substance abuse (Hx. Benzodiazepine  use). Negative for memory loss and suicidal ideas. The patient has insomnia (Stable). The patient is not nervous/anxious.     Blood pressure 95/83, pulse 93, temperature 98.2 F (36.8 C), temperature source Oral, resp. rate 18, height 5\' 2"  (1.575 m), weight 87.5 kg.Body mass index is 35.3 kg/m.  See Md's SRA   Have you used any form of tobacco in the last 30 days? (Cigarettes, Smokeless Tobacco, Cigars, and/or Pipes): Yes  Has this patient used any form of tobacco in the last 30 days? (Cigarettes, Smokeless Tobacco, Cigars, and/or Pipes): Yes, an FDA-approved tobacco cessation medication was offered at discharge.  Blood Alcohol level:  Lab Results  Component Value Date   ETH <10 09/28/2017   ETH <11 09/07/2011   Metabolic Disorder Labs:  Lab Results  Component  Value Date   HGBA1C 5.5 10/01/2017   MPG 111.15 10/01/2017   MPG 105 01/08/2009   Lab Results  Component Value Date   PROLACTIN 19.0 10/01/2017   Lab Results  Component Value Date   CHOL 154 10/01/2017   TRIG 163 (H) 10/01/2017   HDL 44 10/01/2017   CHOLHDL 3.5 10/01/2017   VLDL 33 10/01/2017   LDLCALC 77 10/01/2017   LDLCALC  01/08/2009    41        Total Cholesterol/HDL:CHD Risk Coronary Heart Disease Risk Table                     Men   Women  1/2 Average Risk   3.4   3.3  Average Risk       5.0   4.4  2 X Average Risk   9.6   7.1  3 X Average Risk  23.4   11.0        Use the calculated Patient Ratio above and the CHD Risk Table to determine the patient's CHD Risk.        ATP III CLASSIFICATION (LDL):  <100     mg/dL   Optimal  782-956  mg/dL   Near or Above                    Optimal  130-159  mg/dL   Borderline  213-086  mg/dL   High  >578     mg/dL   Very High   See Psychiatric Specialty Exam and Suicide Risk Assessment completed by Attending Physician prior to discharge.  Discharge destination:  Home  Is patient on multiple antipsychotic therapies at discharge:  No   Has Patient had three or more failed trials of antipsychotic monotherapy by history:  No  Recommended Plan for Multiple Antipsychotic Therapies: NA  Allergies as of 10/07/2017      Reactions   Ciprofloxacin Hcl Other (See Comments)   Severe contractions   Sulfa Antibiotics Anaphylaxis   Sulfa Antibiotics Anaphylaxis   Ativan [lorazepam] Other (See Comments)   I feel like I"m climbing the walls, makes my skin crawl   Latex Hives   blisters   Ciprofloxacin Other (See Comments)   Muscle tightness   Keflex [cephalexin] Nausea And Vomiting   Metoclopramide Hcl Other (See Comments)   Skin feels like its crawling   Restoril Other (See Comments)   Skin feels like its crawling   Risperidone Other (See Comments)   Skin feels like its crawling      Medication List    STOP taking these  medications   diazepam 10 MG tablet Commonly known as:  VALIUM  hydrochlorothiazide 12.5 MG capsule Commonly known as:  MICROZIDE   medroxyPROGESTERone 150 MG/ML injection Commonly known as:  DEPO-PROVERA   pantoprazole 40 MG tablet Commonly known as:  PROTONIX     TAKE these medications     Indication  albuterol 108 (90 Base) MCG/ACT inhaler Commonly known as:  PROVENTIL HFA;VENTOLIN HFA Inhale 2 puffs into the lungs every 4 (four) hours as needed for wheezing or shortness of breath.  Indication:  Asthma   ARIPiprazole 15 MG tablet Commonly known as:  ABILIFY Take 1 tablet (15 mg total) by mouth daily. For mood control Start taking on:  10/08/2017  Indication:  Mood control   atorvastatin 20 MG tablet Commonly known as:  LIPITOR Take 1 tablet (20 mg total) by mouth daily. For high cholesterol What changed:  additional instructions  Indication:  High Amount of Fats in the Blood, High Amount of Triglycerides in the Blood   DULoxetine 20 MG capsule Commonly known as:  CYMBALTA Take 1 capsule (20 mg total) by mouth 2 (two) times daily. For depression What changed:  additional instructions  Indication:  Major Depressive Disorder   hydrocortisone cream 1 % Apply topically 2 (two) times daily. For itching  Indication:  Itching   hydrOXYzine 25 MG tablet Commonly known as:  ATARAX/VISTARIL Take 1 tablet (25 mg total) by mouth every 6 (six) hours as needed for itching. (anxiety) What changed:  additional instructions  Indication:  Feeling Anxious   nicotine 21 mg/24hr patch Commonly known as:  NICODERM CQ - dosed in mg/24 hours Place 1 patch (21 mg total) onto the skin daily. (May buy from over the counter): For smoking cessation Start taking on:  10/08/2017  Indication:  Nicotine Addiction   traZODone 50 MG tablet Commonly known as:  DESYREL Take 1 tablet (50 mg total) by mouth at bedtime as needed for sleep.  Indication:  Trouble Sleeping      Follow-up  Information    [facility] Follow [facility].   Why:  Go to the walk-in clinic within 3 days of d/c from the hospital, M-F between 8 and 11AM for your hospital follow up appointment.  Take your insurance card and your hospital d/c paperwork Contact information: [address] [place] [address] [phone] [phone]         Follow-up recommendations: Activity:  As tolerated Diet: As recommended by your primary care doctor. Keep all scheduled follow-up appointments as recommended.   Comments: Patient is instructed prior to discharge to: Take all medications as prescribed by his/her mental healthcare provider. Report any adverse effects and or reactions from the medicines to his/her outpatient provider promptly. Patient has been instructed & cautioned: To not engage in alcohol and or illegal drug use while on prescription medicines. In the event of worsening symptoms, patient is instructed to call the crisis hotline, 911 and or go to the nearest ED for appropriate evaluation and treatment of symptoms. To follow-up with his/her primary care provider for your other medical issues, concerns and or health care needs.   Signed: Armandina Stammer, NP, PMHNP, FNP-BC. 10/07/2017, 10:29 AM   Patient seen, Suicide Assessment Completed.  Disposition Plan Reviewed

## 2017-10-07 NOTE — Progress Notes (Signed)
Recreation Therapy Notes  INPATIENT RECREATION TR PLAN  Patient Details Name: Rebecca [name] MRN: [id number] DOB: Dec 05, 1960 Today's Date: 10/07/2017  Rec Therapy Plan Is patient appropriate for Therapeutic Recreation?: Yes Treatment times per week: about 3 days Estimated Length of Stay: 5-7 days TR Treatment/Interventions: Group participation (Comment)  Discharge Criteria Pt will be discharged from therapy if:: Discharged Treatment plan/goals/alternatives discussed and agreed upon by:: Patient/family  Discharge Summary Short term goals set: See patient care plan Short term goals met: Complete Progress toward goals comments: Groups attended Which groups?: Self-esteem, Wellness, Coping skills, Leisure education Reason goals not met: None Therapeutic equipment acquired: N/A Reason patient discharged from therapy: Discharge from hospital Pt/family agrees with progress & goals achieved: Yes Date patient discharged from therapy: 10/07/17    Victorino Sparrow, LRT/CTRS  Ria Comment, Trevon Strothers A 10/07/2017, 11:06 AM

## 2017-10-07 NOTE — Plan of Care (Signed)
Pt engaged in groups without prompting after completion of recreation therapy group sessions.   Caroll RancherMarjette Ludean Duhart, LRT/CTRS

## 2017-10-07 NOTE — Progress Notes (Addendum)
Rebecca [name] was D/C from the unit accompanied by daughter.  She was pleasant and cooperative. She voiced no SI/HI.  Alert and oriented X 3.  Affect bright.  D/C follow up paperwork reviewed with pt and copy sent as well as prescriptions.  Belongings (from locker 49) returned to pt. Q 15 min checks maintained until discharge.  Rebecca [name] left the unit in no apparent distress.  During the review of the d/c paperwork, Rebecca [name] mistakenly picked up 15 minute check sheet.  RN attempted to catch her in the parking lot without success.  Pt did have check sheet and was monitored q 15 minutes.

## 2017-10-07 NOTE — Progress Notes (Signed)
Recreation Therapy Notes  Date: 9.16.19 Time: 1000 Location: [address] Dayroom  Group Topic: Coping Skills  Goal Area(s) Addresses:  Patient will be able to identify positive coping skills. Patient will be able to identify benefits of using coping skills post d/c.  Behavioral Response: None  Intervention: Worksheet, pencils  Activity: OrthoptistWeb Design.  Patients were to identify the things, people, situations they feel are holding them back and write them inside the web.  Patients were to then identify their coping skills and place them on the outside of the web.  Education: PharmacologistCoping Skills, Building control surveyorDischarge Planning.   Education Outcome: Acknowledges understanding/In group clarification offered/Needs additional education.   Clinical Observations/Feedback: Pt did not complete the sheet because she stated she is leaving today.  Pt stated she was just going to listen to everyone else.    Caroll RancherMarjette Janelis Stelzer, LRT/CTRS     Lillia AbedLindsay, Lania Zawistowski A 10/07/2017 11:11 AM

## 2017-10-07 NOTE — [facility] Suicide Risk Assessment (Signed)
[facility] Discharge Suicide Risk Assessment   Principal Problem: Bipolar 1 disorder, depressed, severe (HCC) Discharge Diagnoses:  Patient Active Problem List   Diagnosis Date Noted  . Bipolar 1 disorder, depressed, severe (HCC) [F31.4] 09/30/2017  . Manic behavior (HCC) [F30.10]   . Affective psychosis, bipolar (HCC) [F31.9]     Total Time spent with patient: 30 minutes  Musculoskeletal: Strength & Muscle Tone: within normal limits Gait & Station: normal Patient leans: N/A  Psychiatric Specialty Exam: Review of Systems  Constitutional: Negative for chills and fever.  Respiratory: Negative for cough and shortness of breath.   Cardiovascular: Negative for chest pain.  Gastrointestinal: Negative for abdominal pain, heartburn, nausea and vomiting.  Psychiatric/Behavioral: Positive for depression. Negative for hallucinations and suicidal ideas. The patient is not nervous/anxious and does not have insomnia.     Blood pressure 95/83, pulse 93, temperature 98.2 F (36.8 C), temperature source Oral, resp. rate 18, height 5\' 2"  (1.575 m), weight 87.5 kg.Body mass index is 35.3 kg/m.  General Appearance: Casual and Fairly Groomed  Eye Contact::  Good  Speech:  Clear and Coherent and Normal Rate  Volume:  Normal  Mood:  Anxious and Depressed  Affect:  Appropriate, Congruent, Constricted and Depressed  Thought Process:  Coherent and Goal Directed  Orientation:  Full (Time, Place, and Person)  Thought Content:  Logical  Suicidal Thoughts:  No  Homicidal Thoughts:  No  Memory:  Immediate;   Fair Recent;   Fair Remote;   Fair  Judgement:  Poor  Insight:  Lacking  Psychomotor Activity:  Normal  Concentration:  Fair  Recall:  [employer] of Knowledge:Fair  Language: Fair  Akathisia:  No  Handed:    AIMS (if indicated):     Assets:  Resilience Social Support  Sleep:  Number of Hours: 4.25  Cognition: WNL  ADL's:  Intact   Mental Status Per Nursing Assessment::   On Admission:   NA  Demographic Factors:  Caucasian, Low socioeconomic status, Living alone and Unemployed  Loss Factors: Decrease in vocational status, Loss of significant relationship, Legal issues and Financial problems/change in socioeconomic status  Historical Factors: Impulsivity  Risk Reduction Factors:   Positive social support and Positive coping skills or problem solving skills  Continued Clinical Symptoms:  Bipolar Disorder:   Depressive phase  Cognitive Features That Contribute To Risk:  None    Suicide Risk:  Minimal: No identifiable suicidal ideation.  Patients presenting with no risk factors but with morbid ruminations; may be classified as minimal risk based on the severity of the depressive symptoms  Follow-up Information    [facility] Follow [facility].   Why:  Go to the walk-in clinic within 3 days of d/c from the hospital, M-F between 8 and 11AM for your hospital follow up appointment.  Take your insurance card and your hospital d/c paperwork Contact information: [address] [place] [address] [phone] F:[phone]        Subjective Data:  Rebecca [name] is a 57 y/o F with history of Bipolar I and ADHD who was admitted from [facility] on IVC initiated by her daughter due to worsening agitation, disorganized behaviors, throwing objects, ripping out her own hair, responding to internal stimuli, and medication non-adherence. Pt was flat with increased latency and poor ability to provide history in the ED. She was medically cleared and then transferred to [facility] for additional treatment and stabilization.She was restarted on previous medication of cymbalta and she was also  started on trial of abilify.She has been reporting incremental improvement of her presenting symptoms.  Today upon evaluation,pt shares, "I'm fine." She denies any specific concerns aside from seeking discharge. She is sleeping well. Her appetite is good. She denies SI/HI/AH/VH. She  is tolerating her medications well, and she is in agreement to continue her current regimen without changes. [place] and if she is not able to stay there she plans to stay with a friend. She is in agreement [place] [facility] [place]. She verbalizes good awareness of her legal situation. She was able to engage in safety planning including [facility] or contact emergency services if she feels unable to maintain her own safety or the safety of others. Pt had no further questions, comments, or concerns.   Plan Of Care/Follow-up recommendations:   -Discharge to outpatient level of care  -Bipolar current episode depressed, severe, with psychosis -Continue abilify 15mg  po qDay -Continue cymbalta 20mg  po BID  -anxiety -Continue vistaril 25mg  po q6h prn anxiety  -insomnia - Continue trazodone 50mg  po qhs prn insomnia  -GERD -Continue protonix 40mg  po qDay  -HLD -Continue lipitor 20mg  po qDay  -COPD -Continue albuterol 14908mcg/act take 2 puffs q4h prn wheeze/SOB  Activity:  as tolerated Diet:  normal Tests:  NA Other:  see above for DC plan  Micheal Likenshristopher T Gay Moncivais, MD 10/07/2017, 8:30 AM

## 2017-10-08 NOTE — Progress Notes (Signed)
  [facility] Discharge Plan :  Will you be returning to the same living situation after discharge:  Yes,  home At discharge, do you have transportation home?: Yes,  daughter Do you have the ability to pay for your medications: Yes,  insurance  Release of information consent forms completed and in the chart;  Patient's signature needed at discharge.  Patient to Follow up at: Follow-up Information    [facility].   Why:  Go to the walk-in clinic within 3 days of d/c from the hospital, M-F between 8 and 11AM for your hospital follow up appointment.  Take your insurance card and your hospital d/c paperwork Contact information: [address] [place] [phone] F:[phone]          Next level of care provider [facility] Link:no  Safety Planning and Suicide Prevention discussed: Yes,  yes  Have you used any form of tobacco in the last 30 days? (Cigarettes, Smokeless Tobacco, Cigars, and/or Pipes): Yes  Has patient been referred to the Quitline?: Patient refused referral  Patient has been referred for addiction treatment: N/A  Ida RogueRodney B Mega Kinkade, LCSW 10/08/2017, 9:04 AM

## 2019-12-11 ENCOUNTER — Ambulatory Visit
Admission: EM | Admit: 2019-12-11 | Discharge: 2019-12-11 | Disposition: A | Discharge status: Home / Self Care | Payer: Medicare PPO | Attending: Physician Assistant | Admitting: Physician Assistant

## 2019-12-11 ENCOUNTER — Other Ambulatory Visit: Payer: Self-pay

## 2019-12-11 DIAGNOSIS — Z760 Encounter for issue of repeat prescription: Secondary | ICD-10-CM | POA: Diagnosis not present

## 2019-12-11 MED ORDER — ATORVASTATIN CALCIUM 20 MG PO TABS: 20.0000 mg | ORAL_TABLET | Freq: Every day | ORAL | 0 refills | Status: AC

## 2019-12-11 MED ORDER — ATORVASTATIN CALCIUM 20 MG PO TABS: 20.0000 mg | ORAL_TABLET | Freq: Every day | ORAL | 0 refills | Status: DC

## 2019-12-11 MED ORDER — MELOXICAM 7.5 MG PO TABS: 7.5000 mg | ORAL_TABLET | Freq: Every day | ORAL | 0 refills | Status: AC

## 2019-12-11 MED ORDER — DULOXETINE HCL 30 MG PO CPEP: 30.0000 mg | ORAL_CAPSULE | Freq: Every day | ORAL | 0 refills | Status: DC

## 2019-12-11 MED ORDER — DULOXETINE HCL 30 MG PO CPEP: 30.0000 mg | ORAL_CAPSULE | Freq: Every day | ORAL | 0 refills | Status: AC

## 2019-12-11 MED ORDER — OLANZAPINE 5 MG PO TABS: 5.0000 mg | ORAL_TABLET | Freq: Every day | ORAL | 0 refills | Status: AC

## 2019-12-11 NOTE — ED Triage Notes (Signed)
Patient relocated from another city in [place] and has not established a primary care doctor. Patient needs 5 meds refilled. Pt is aox4 and ambulatory.

## 2019-12-11 NOTE — ED Provider Notes (Signed)
[name]    CSN: [id number] Arrival date & time: 12/11/19  1332      History   Chief Complaint Chief Complaint  Patient presents with  . Medication Refill    x 5 meds    HPI Rebecca [name] is a 59 y.o. female.   59 year old female come in for medication refill. States relocated to [facility] and has not found PCP. Needs refill of duloxetine, olanzapine, mobic, atorvastatin. She has missed dosages as she ran out of medication. Otherwise, has not had problems with medication.      Past Medical History:  Diagnosis Date  . ADHD (attention deficit hyperactivity disorder)   . Back pain   . Carpal tunnel syndrome   . Chronic pain   . Diabetes mellitus   . Fibromyalgia   . GERD (gastroesophageal reflux disease)   . Mitral valve prolapse   . Mood disorder [facility])     Patient Active Problem List   Diagnosis Date Noted  . Bipolar 1 disorder, depressed, severe (HCC) 09/30/2017  . Manic behavior (HCC)   . Affective psychosis, bipolar (HCC)     Past Surgical History:  Procedure Laterality Date  . ABDOMINAL HYSTERECTOMY    . BACK SURGERY    . CARPAL TUNNEL RELEASE    . mood disorder    . neuro stimulator     Pt had neuro stiumlator implated in back   . SPINAL CORD STIMULATOR IMPLANT      OB History    Gravida  4   Para  4   Term  4   Preterm      AB      Living  4     SAB      TAB      Ectopic      Multiple      Live Births  4            Home Medications    Prior to Admission medications   Medication Sig Start Date End Date Taking? Authorizing Provider  albuterol (PROVENTIL HFA;VENTOLIN HFA) 108 (90 Base) MCG/ACT inhaler Inhale 2 puffs into the lungs every 4 (four) hours as needed for wheezing or shortness of breath. 10/07/17  Yes Armandina Stammer I, NP  hydrOXYzine (ATARAX/VISTARIL) 25 MG tablet Take 1 tablet (25 mg total) by mouth every 6 (six) hours as needed for itching. (anxiety) 10/07/17  Yes Armandina Stammer I, NP    atorvastatin (LIPITOR) 20 MG tablet Take 1 tablet (20 mg total) by mouth daily. For high cholesterol 12/11/19   Jaylon Grode V, PA-C  DULoxetine (CYMBALTA) 30 MG capsule Take 1 capsule (30 mg total) by mouth daily. For depression 12/11/19   Belinda Fisher, PA-C  meloxicam (MOBIC) 7.5 MG tablet Take 1 tablet (7.5 mg total) by mouth daily. 12/11/19   Cathie Hoops, Marciel Offenberger V, PA-C  OLANZapine (ZYPREXA) 5 MG tablet Take 1 tablet (5 mg total) by mouth at bedtime. 12/11/19   Cathie Hoops, Sarha Bartelt V, PA-C  ARIPiprazole (ABILIFY) 15 MG tablet Take 1 tablet (15 mg total) by mouth daily. For mood control 10/08/17 12/11/19  Armandina Stammer I, NP  pantoprazole (PROTONIX) 40 MG tablet Take 1 tablet (40 mg total) by mouth daily. For acid reflux 10/07/17 12/11/19  Armandina Stammer I, NP  traZODone (DESYREL) 50 MG tablet Take 1 tablet (50 mg total) by mouth at bedtime as needed for sleep. 10/07/17 12/11/19  Sanjuana Kava, NP    Family History History reviewed. No  pertinent family history.  Social History Social History   Tobacco Use  . Smoking status: Current Every Day Smoker    Packs/day: 1.00    Types: Cigarettes  . Smokeless tobacco: Never Used  Vaping Use  . Vaping Use: Never used  Substance Use Topics  . Alcohol use: Yes    Comment:    . Drug use: Yes    Types: Marijuana     Allergies   Ciprofloxacin hcl, Sulfa antibiotics, Sulfa antibiotics, Ativan [lorazepam], Latex, Ciprofloxacin, Keflex [cephalexin], Metoclopramide hcl, Restoril, and Risperidone   Review of Systems Review of Systems  Reason unable to perform ROS: See HPI as above.     Physical Exam Triage Vital Signs ED Triage Vitals  Enc Vitals Group     BP 12/11/19 1507 123/88     Pulse Rate 12/11/19 1507 71     Resp 12/11/19 1507 20     Temp 12/11/19 1507 97.7 F (36.5 C)     Temp Source 12/11/19 1507 Oral     SpO2 12/11/19 1507 98 %     Weight --      Height --      Head Circumference --      Peak Flow --      Pain Score 12/11/19 1520 0     Pain Loc --       Pain Edu? --      Excl. in GC? --    No data found.  Updated Vital Signs BP 123/88 (BP Location: Left Arm)   Pulse 71   Temp 97.7 F (36.5 C) (Oral)   Resp 20   SpO2 98%   Visual Acuity Right Eye Distance:   Left Eye Distance:   Bilateral Distance:    Right Eye Near:   Left Eye Near:    Bilateral Near:     Physical Exam Constitutional:      General: She is not in acute distress.    Appearance: Normal appearance. She is well-developed. She is not toxic-appearing or diaphoretic.  HENT:     Head: Normocephalic and atraumatic.  Eyes:     Conjunctiva/sclera: Conjunctivae normal.     Pupils: Pupils are equal, round, and reactive to light.  Pulmonary:     Effort: Pulmonary effort is normal. No respiratory distress.  Musculoskeletal:     Cervical back: Normal range of motion and neck supple.  Skin:    General: Skin is warm and dry.  Neurological:     Mental Status: She is alert and oriented to person, place, and time.      UC Treatments / Results  Labs (all labs ordered are listed, but only abnormal results are displayed) Labs Reviewed - No data to display  EKG   Radiology No results found.  Procedures Procedures (including critical care time)  Medications Ordered in UC Medications - No data to display  Initial Impression / Assessment and Plan / UC Course  I have reviewed the triage vital signs and the nursing notes.  Pertinent labs & imaging results that were available during my care of the patient were reviewed by me and considered in my medical decision making (see chart for details).    Medication refilled for 30 days. PCP follow up for further refill and management needed.  Final Clinical Impressions(s) / UC Diagnoses   Final diagnoses:  Medication refill    ED Prescriptions    Medication Sig Dispense Auth. Provider   DULoxetine (CYMBALTA) 30 MG capsule  (Status: Discontinued) Take  1 capsule (30 mg total) by mouth daily. For depression 30  capsule Masha Orbach V, PA-C   atorvastatin (LIPITOR) 20 MG tablet  (Status: Discontinued) Take 1 tablet (20 mg total) by mouth daily. For high cholesterol 30 tablet Gennifer Potenza V, PA-C   OLANZapine (ZYPREXA) 5 MG tablet Take 1 tablet (5 mg total) by mouth at bedtime. 30 tablet Elektra Wartman V, PA-C   meloxicam (MOBIC) 7.5 MG tablet Take 1 tablet (7.5 mg total) by mouth daily. 30 tablet Analisia Kingsford V, PA-C   DULoxetine (CYMBALTA) 30 MG capsule Take 1 capsule (30 mg total) by mouth daily. For depression 30 capsule Zerenity Bowron V, PA-C   atorvastatin (LIPITOR) 20 MG tablet Take 1 tablet (20 mg total) by mouth daily. For high cholesterol 30 tablet Cathie Hoops, Daune Divirgilio V, PA-C     I have reviewed the PDMP during this encounter.   Belinda Fisher, PA-C 12/11/19 1554

## 2019-12-11 NOTE — Discharge Instructions (Signed)
Medication refilled for 30 days. Please follow up with PCP for further refills needed.
